# Patient Record
Sex: Female | Born: 1980
Health system: Southern US, Community
[De-identification: ages and names within clinical notes are randomized; demographics above are authoritative.]

## PROBLEM LIST (undated history)

## (undated) DIAGNOSIS — R519 Headache, unspecified: Secondary | ICD-10-CM

## (undated) DIAGNOSIS — S22000A Wedge compression fracture of unspecified thoracic vertebra, initial encounter for closed fracture: Secondary | ICD-10-CM

## (undated) DIAGNOSIS — F419 Anxiety disorder, unspecified: Secondary | ICD-10-CM

## (undated) DIAGNOSIS — R430 Anosmia: Secondary | ICD-10-CM

## (undated) DIAGNOSIS — S069X9A Unspecified intracranial injury with loss of consciousness of unspecified duration, initial encounter: Secondary | ICD-10-CM

## (undated) DIAGNOSIS — M1990 Unspecified osteoarthritis, unspecified site: Secondary | ICD-10-CM

## (undated) DIAGNOSIS — R51 Headache: Secondary | ICD-10-CM

## (undated) DIAGNOSIS — F32A Depression, unspecified: Secondary | ICD-10-CM

## (undated) HISTORY — DX: Unspecified osteoarthritis, unspecified site: M19.90

## (undated) HISTORY — DX: Wedge compression fracture of unspecified thoracic vertebra, initial encounter for closed fracture: S22.000A

## (undated) HISTORY — PX: CRANIOTOMY: SHX93

## (undated) HISTORY — DX: Anosmia: R43.0

## (undated) HISTORY — PX: FRACTURE SURGERY: SHX138

## (undated) HISTORY — PX: CRANIOPLASTY: SUR330

## (undated) HISTORY — DX: Unspecified intracranial injury with loss of consciousness of unspecified duration, initial encounter: S06.9X9A

## (undated) HISTORY — DX: Anxiety disorder, unspecified: F41.9

## (undated) HISTORY — PX: INDUCED ABORTION: SHX677

## (undated) HISTORY — DX: Headache: R51

## (undated) HISTORY — PX: ORTHOPEDIC SURGERY: SHX850

## (undated) HISTORY — DX: Headache, unspecified: R51.9

---

## 2011-11-06 ENCOUNTER — Emergency Department (HOSPITAL_COMMUNITY)
Admission: EM | Admit: 2011-11-06 | Discharge: 2011-11-06 | Disposition: A | Discharge status: Home / Self Care | Payer: Self-pay | Attending: Emergency Medicine | Admitting: Emergency Medicine

## 2011-11-06 ENCOUNTER — Encounter (HOSPITAL_COMMUNITY): Payer: Self-pay

## 2011-11-06 ENCOUNTER — Emergency Department (HOSPITAL_COMMUNITY): Payer: Self-pay

## 2011-11-06 DIAGNOSIS — R259 Unspecified abnormal involuntary movements: Secondary | ICD-10-CM | POA: Insufficient documentation

## 2011-11-06 DIAGNOSIS — F1020 Alcohol dependence, uncomplicated: Secondary | ICD-10-CM | POA: Insufficient documentation

## 2011-11-06 DIAGNOSIS — R569 Unspecified convulsions: Secondary | ICD-10-CM | POA: Insufficient documentation

## 2011-11-06 DIAGNOSIS — F10939 Alcohol use, unspecified with withdrawal, unspecified: Secondary | ICD-10-CM | POA: Insufficient documentation

## 2011-11-06 DIAGNOSIS — F10239 Alcohol dependence with withdrawal, unspecified: Secondary | ICD-10-CM | POA: Insufficient documentation

## 2011-11-06 MED ORDER — LORAZEPAM 1 MG PO TABS
2.0000 mg | ORAL_TABLET | Freq: Once | ORAL | Status: AC
  Administered 2011-11-06: 2 mg via ORAL
  Filled 2011-11-06: qty 2

## 2011-11-06 MED ORDER — LORAZEPAM 2 MG PO TABS: 2.0000 mg | ORAL_TABLET | Freq: Once | ORAL | Status: AC

## 2011-11-06 NOTE — ED Provider Notes (Addendum)
History     CSN: [id number]  Arrival date & time 11/06/11  1904   First MD Initiated Contact with Patient 11/06/11 1920      Chief Complaint  Patient presents with  . Seizures    HPI Pt states that she had a slight HA but not severe enough to cause alarm throughout the day. Pt also states that she was shakey and has not eaten a lot today prior to seizure. Pt states that the last thing she remembers was watching TV in the living room and then she remembers waking up on the couch post seizure. Pt denies incontinence or biting her tongue, pt does not believe she hit her head, pt denies any pain at this time. Pt is alert and oriented and able to follow commands and move extremities. Seizure pads placed on bed, pt aware and fiance aware to press call button if anything changes or pt is concerned. Pt admits to an abortion 2-2 1/2 weeks ago after [redacted] weeks pregnant. Pt states that she has not had any problems from the abortion such as bleeding.   Pt last drank 1-2 days ago and according to husband, she drinks  5-10 beers/day.  Has felt "jittery" toady prior to seizure. History reviewed. No pertinent past medical history.  Past Surgical History  Procedure Date  . Orthopedic surgery   . Induced abortion     10/26/11    History reviewed. No pertinent family history.  History  Substance Use Topics  . Smoking status: Current Everyday Smoker -- 0.5 packs/day for 10 years    Types: Cigarettes  . Smokeless tobacco: Not on file  . Alcohol Use: 1.2 oz/week    2 Cans of beer per week    OB History    Grav Para Term Preterm Abortions TAB SAB Ect Mult Living   2 0              Review of Systems Remaining unremarkable except as noted in history of present illness Allergies  Review of patient's allergies indicates no known allergies.  Home Medications   Current Outpatient Rx  Name Route Sig Dispense Refill  . GOODY HEADACHE PO Oral Take 1 packet by mouth daily as needed. For headache      . BUPRENORPHINE HCL-NALOXONE HCL 8-2 MG SL SUBL Sublingual Place 1 tablet under the tongue daily as needed. For urge    . ADULT MULTIVITAMIN W/MINERALS CH Oral Take 1 tablet by mouth daily.    Marland Kitchen LORAZEPAM 2 MG PO TABS Oral Take 1 tablet (2 mg total) by mouth once. 30 tablet 0    BP 112/63  Pulse 80  Temp(Src) 98.3 F (36.8 C) (Oral)  Resp 16  SpO2 97%  LMP 09/22/2011  Physical Exam  Nursing note and vitals reviewed. Constitutional: She is oriented to person, place, and time. She appears well-developed and well-nourished. No distress.  HENT:  Head: Normocephalic and atraumatic.  Eyes: Pupils are equal, round, and reactive to light.  Neck: Normal range of motion.  Cardiovascular: Normal rate and intact distal pulses.   Pulmonary/Chest: No respiratory distress.  Abdominal: Normal appearance. She exhibits no distension. There is no tenderness. There is no rebound.  Musculoskeletal: Normal range of motion.  Neurological: She is alert and oriented to person, place, and time. No cranial nerve deficit.  Skin: Skin is warm and dry. No rash noted.  Psychiatric: She has a normal mood and affect. Her behavior is normal.    ED Course  Procedures (  including critical care time)  Labs Reviewed - No data to display No results found.   1. Seizure   2. Alcohol withdrawal       MDM          Nelia Shi, MD 11/08/11 6213  Nelia Shi, MD 11/08/11 [phone]

## 2011-11-06 NOTE — Discharge Instructions (Signed)
Alcohol Withdrawal Anytime drug use is interfering with normal living activities it has become abuse. This includes problems with family and friends. Psychological dependence has developed when your mind tells you that the drug is needed. This is usually followed by physical dependence when a continuing increase of drugs are required to get the same feeling or "high." This is known as addiction or chemical dependency. A person's risk is much higher if there is a history of chemical dependency in the family. Mild Withdrawal Following Stopping Alcohol, When Addiction or Chemical Dependency Has Developed When a person has developed tolerance to alcohol, any sudden stopping of alcohol can cause uncomfortable physical symptoms. Most of the time these are mild and consist of tremors in the hands and increases in heart rate, breathing, and temperature. Sometimes these symptoms are associated with anxiety, panic attacks, and bad dreams. There may also be stomach upset. Normal sleep patterns are often interrupted with periods of inability to sleep (insomnia). This may last for 6 months. Because of this discomfort, many people choose to continue drinking to get rid of this discomfort and to try to feel normal. Severe Withdrawal with Decreased or No Alcohol Intake, When Addiction or Chemical Dependency Has Developed About five percent of alcoholics will develop signs of severe withdrawal when they stop using alcohol. One sign of this is development of generalized seizures (convulsions). Other signs of this are severe agitation and confusion. This may be associated with believing in things which are not real or seeing things which are not really there (delusions and hallucinations). Vitamin deficiencies are usually present if alcohol intake has been long-term. Treatment for this most often requires hospitalization and close observation. Addiction can only be helped by stopping use of all chemicals. This is hard but may  save your life. With continual alcohol use, possible outcomes are usually loss of self respect and esteem, violence, and death. Addiction cannot be cured but it can be stopped. This often requires outside help and the care of professionals. Treatment centers are listed in the yellow pages under Cocaine, Narcotics, and Alcoholics Anonymous. Most hospitals and clinics can refer you to a specialized care center. It is not necessary for you to go through the uncomfortable symptoms of withdrawal. Your caregiver can provide you with medicines that will help you through this difficult period. Try to avoid situations, friends, or drugs that made it possible for you to keep using alcohol in the past. Learn how to say no. It takes a long period of time to overcome addictions to all drugs, including alcohol. There may be many times when you feel as though you want a drink. After getting rid of the physical addiction and withdrawal, you will have a lessening of the craving which tells you that you need alcohol to feel normal. Call your caregiver if more support is needed. Learn who to talk to in your family and among your friends so that during these periods you can receive outside help. Alcoholics Anonymous (AA) has helped many people over the years. To get further help, contact AA or call your caregiver, counselor, or clergyperson. Al-Anon and Alateen are support groups for friends and family members of an alcoholic. The people who love and care for an alcoholic often need help, too. For information about these organizations, check your phone directory or call a local alcoholism treatment center.  SEEK IMMEDIATE MEDICAL CARE IF:   You have a seizure.   You have a fever.   You experience uncontrolled vomiting  or you vomit up blood. This may be bright red or look like black coffee grounds.   You have blood in the stool. This may be bright red or appear as a black, tarry, bad-smelling stool.   You become  lightheaded or faint. Do not drive if you feel this way. Have someone else drive you or call 161 for help.   You become more agitated or confused.   You develop uncontrolled anxiety.   You begin to see things that are not really there (hallucinate).  Your caregiver has determined that you completely understand your medical condition, and that your mental state is back to normal. You understand that you have been treated for alcohol withdrawal, have agreed not to drink any alcohol for a minimum of 1 day, will not operate a car or other machinery for 24 hours, and have had an opportunity to ask any questions about your condition. Document Released: 07/07/2005 Document Revised: 06/09/2011 Document Reviewed: 05/15/2008 [facility] Patient Information 2012 [place], [place].Seizure, Adult A seizure happens when there is uncontrolled activity in the brain. Most seizures cause either the whole body to shake or just one part of the body to shake. The shaking is called a convulsion. Other seizures cause the body or a part of the body to tense up.  HOME CARE   Keep all follow-up visits.   Take medicines as told by your doctor.   Do not swim until your doctor says it is okay.   Do not drive until your doctor says it is okay.   Make sure your friends, loved ones, and coworkers know to do the following things if you have a seizure:   Lower you gently to the ground.   Keep the area around you free of things that might fall or things that you might grab.   Avoid putting anything in or near your mouth.   Call your local emergency services (911 in [place]  GET HELP RIGHT AWAY IF:   You have confusion or feel dazed.   You feel sleepy.  MAKE SURE YOU:   Understand these instructions.   Will watch your condition.   Will get help right away if you are not doing well or get worse.  Document Released: 03/15/2008 Document Revised: 06/09/2011 Document Reviewed: 03/09/2010 [facility] Patient Information  2012 [place], [place].

## 2011-11-06 NOTE — ED Notes (Signed)
Ems states called on scene for seizure activity. Family described grand mal seizure activity. Pt was alert to person and place on scene with no seizure activity.

## 2011-11-06 NOTE — ED Notes (Addendum)
Pt states that she had a slight HA but not severe enough to cause alarm throughout the day. Pt also states that she was shakey and has not eaten a lot today prior to seizure.  Pt states that the last thing she remembers was watching TV in the living room and then she remembers waking up on the couch post seizure. Pt denies incontinence, pt does not believe she hit her head, pt denies any pain at this time. Pt is alert and oriented and able to follow commands and move extremities. Seizure pads placed on bed, pt aware and fiance aware to press call button if anything changes or pt is concerned. Pt admits to an abortion 2-2 1/2 weeks ago after [redacted] weeks pregnant. Pt states that she has not had any problems from the abortion such as bleeding.

## 2011-11-06 NOTE — ED Notes (Signed)
Fiance' is in [place]  Nelia Shi, MD 11/06/11 2010

## 2013-07-28 DIAGNOSIS — S069XAA Unspecified intracranial injury with loss of consciousness status unknown, initial encounter: Secondary | ICD-10-CM

## 2013-07-28 DIAGNOSIS — S069X9A Unspecified intracranial injury with loss of consciousness of unspecified duration, initial encounter: Secondary | ICD-10-CM

## 2013-07-28 HISTORY — DX: Unspecified intracranial injury with loss of consciousness of unspecified duration, initial encounter: S06.9X9A

## 2013-07-28 HISTORY — DX: Unspecified intracranial injury with loss of consciousness status unknown, initial encounter: S06.9XAA

## 2013-09-18 ENCOUNTER — Ambulatory Visit: Payer: Medicaid Other | Admitting: Occupational Therapy

## 2013-09-18 ENCOUNTER — Ambulatory Visit: Payer: Medicaid Other | Attending: *Deleted | Admitting: Physical Therapy

## 2013-09-18 ENCOUNTER — Ambulatory Visit: Payer: Medicaid Other | Admitting: Speech Pathology

## 2013-09-18 DIAGNOSIS — M6281 Muscle weakness (generalized): Secondary | ICD-10-CM | POA: Diagnosis not present

## 2013-09-18 DIAGNOSIS — R5381 Other malaise: Secondary | ICD-10-CM | POA: Diagnosis not present

## 2013-09-18 DIAGNOSIS — R4189 Other symptoms and signs involving cognitive functions and awareness: Secondary | ICD-10-CM | POA: Diagnosis not present

## 2013-09-18 DIAGNOSIS — IMO0001 Reserved for inherently not codable concepts without codable children: Secondary | ICD-10-CM | POA: Diagnosis present

## 2014-02-08 ENCOUNTER — Ambulatory Visit: Payer: Medicaid Other | Attending: *Deleted

## 2014-02-08 DIAGNOSIS — R41841 Cognitive communication deficit: Secondary | ICD-10-CM | POA: Insufficient documentation

## 2014-02-08 DIAGNOSIS — IMO0001 Reserved for inherently not codable concepts without codable children: Secondary | ICD-10-CM | POA: Diagnosis present

## 2014-02-08 DIAGNOSIS — R4701 Aphasia: Secondary | ICD-10-CM | POA: Insufficient documentation

## 2014-04-26 ENCOUNTER — Ambulatory Visit (INDEPENDENT_AMBULATORY_CARE_PROVIDER_SITE_OTHER): Payer: Medicaid Other | Admitting: Family Medicine

## 2014-04-26 ENCOUNTER — Encounter: Payer: Self-pay | Admitting: Family Medicine

## 2014-04-26 VITALS — BP 107/62 | HR 86 | Temp 99.1°F | Resp 16 | Wt 138.0 lb

## 2014-04-26 DIAGNOSIS — Z5189 Encounter for other specified aftercare: Secondary | ICD-10-CM

## 2014-04-26 DIAGNOSIS — F411 Generalized anxiety disorder: Secondary | ICD-10-CM

## 2014-04-26 DIAGNOSIS — S069X0D Unspecified intracranial injury without loss of consciousness, subsequent encounter: Secondary | ICD-10-CM

## 2014-04-26 DIAGNOSIS — R41840 Attention and concentration deficit: Secondary | ICD-10-CM

## 2014-04-26 MED ORDER — METHYLPHENIDATE HCL 5 MG PO TABS: 5.0000 mg | ORAL_TABLET | Freq: Three times a day (TID) | ORAL | Status: DC

## 2014-04-26 MED ORDER — DULOXETINE HCL 30 MG PO CPEP: 30.0000 mg | ORAL_CAPSULE | Freq: Every day | ORAL | Status: DC

## 2014-04-26 NOTE — Progress Notes (Signed)
**Note Wanda-Identified via Obfuscation**    Subjective:    Patient ID: Wanda Warner, female    DOB: [birth year], 33 y.o.   MRN: [id number]  HPI  Wanda Warner is here to establish care.   Has chronic headaches since her accident last year. Photosentivity since accident. No prior history of headaches prior to the accident. They a occur on a daily basis. The pain is either frontal or parietal in distribution. Throbbing in nature.    She had a history of anxiety prior to her anxiety but it has worsened since her accident. She was managed with clonopin prior to her accident for accident. She has been on ritalin since her accident. This has help improve her symptoms. She was treated with buspirone, trazodone for sleep and celexa after her accident. These three medications don't seem to be helping currently. Patient has ongoing anxiety and intolerance to going to public places. She has fixates on things and cannot get them out of her head. She can't go to grocery store.   Current Outpatient Prescriptions on File Prior to Visit  Medication Sig Dispense Refill  . Aspirin-Acetaminophen-Caffeine (GOODY HEADACHE PO) Take 1 packet by mouth daily as needed. For headache      . buprenorphine-naloxone (SUBOXONE) 8-2 MG SUBL Place 1 tablet under the tongue daily as needed. For urge      . Multiple Vitamin (MULITIVITAMIN WITH MINERALS) TABS Take 1 tablet by mouth daily.       No current facility-administered medications on file prior to visit.    SHx: A pack can last a month.   Health Maintenance: pap smear needed  Review of Systems See HPI     Objective:   Physical Exam BP 107/62  Pulse 86  Temp(Src) 99.1 F (37.3 C) (Oral)  Resp 16  Wt 138 lb (62.596 kg)  SpO2 100% Gen: NAD, alert, cooperative with exam, well-appearing,speech is slowed  CV: RRR, good S1/S2, no murmur, no edema, capillary refill brisk  Resp: CTABL, no wheezes, non-labored Abd: SNTND, BS present, no guarding or organomegaly Neuro: CN 2-12 intact  Ext: no edema, normal  sensation b/l in UE and LE       Assessment & Plan:

## 2014-04-26 NOTE — Patient Instructions (Signed)
Thank you for coming in,   I recommend that you meet with our psychologist, Dr. Spero GeraldsMichelle Kane, for help dealing with your anxiety.  You can schedule an appointment with her by calling her directly at [phone].  I have prescribed cymbalta. It may take a few days before you start feeling these effects. Please continue to take it.   Please follow up with me in two weeks to see how things are going.    Please feel free to call with any questions or concerns at any time, at [phone]. --Dr. Jordan LikesSchmitz

## 2014-05-02 DIAGNOSIS — S069X9A Unspecified intracranial injury with loss of consciousness of unspecified duration, initial encounter: Secondary | ICD-10-CM | POA: Insufficient documentation

## 2014-05-02 DIAGNOSIS — S069XAA Unspecified intracranial injury with loss of consciousness status unknown, initial encounter: Secondary | ICD-10-CM | POA: Insufficient documentation

## 2014-05-02 DIAGNOSIS — F411 Generalized anxiety disorder: Secondary | ICD-10-CM | POA: Insufficient documentation
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
## 2014-05-08 ENCOUNTER — Telehealth: Payer: Self-pay | Admitting: Family Medicine

## 2014-05-08 NOTE — Telephone Encounter (Signed)
[name]. During appointment to establish care, boyfriend was requesting benzo for acute anxiety or narcotic for constant headache, agreed on starting cymbalta. Unsure if she has a true side effect or if drug seeking. Patient is still new to this provider so I am still cautions.   Myra RudeJeremy E Schmitz, MD PGY-2, [facility] 05/08/2014, 8:44 PM

## 2014-05-08 NOTE — Telephone Encounter (Signed)
Spoke with husband,he states patient complains of  feelinf hung over when she wakes up since on cymbalta.Denies suicidal thoughts.husband was hoping we could give him advise thru phone call instead of having him bring her in.Patients does have follow up appointment as you requested.please advise.thank you .Torben Soloway, Virgel BouquetGiovanna S

## 2014-05-08 NOTE — Telephone Encounter (Signed)
Boyfriend called and would like the nurse or doctor to call and discuss the medication Cymbalta. She is having a lot of side effects and they would like to know if she should stop or have the dosage changed. Please call jw

## 2014-05-09 ENCOUNTER — Telehealth: Payer: Self-pay | Admitting: Psychology

## 2014-05-09 NOTE — Telephone Encounter (Signed)
Relayed message,patient's husband states she could wait til Monday.he's out of town for business.I advised him to call front office and schedule an appointment.he voiced understanding.Bland Rudzinski, Virgel BouquetGiovanna S
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
## 2014-05-10 ENCOUNTER — Telehealth: Payer: Self-pay | Admitting: Psychology

## 2014-05-10 NOTE — Telephone Encounter (Signed)
I received a message back from Eula FlaxMichael Zelson, PH.D.  He is a Web designerneuropsychologist in the [facility].  This was his response to my question about resources:  "I agree that the best course of action would be for her to re-connect with the [facility]. I do not know of a local psychiatrist who has specific experience with TBI. Rather than a psychiatrist, I would recommend considering Ivory BroadZach Swartz, MD, [phone]) who is a rehab physician who specializes in TBI. I have worked with him on mental health issues on many TBI cases."  I called Mr. Dewaine CongerBarker and provided him this information.

## 2014-05-13 ENCOUNTER — Ambulatory Visit (INDEPENDENT_AMBULATORY_CARE_PROVIDER_SITE_OTHER): Payer: Medicaid Other | Admitting: Family Medicine

## 2014-05-13 ENCOUNTER — Encounter: Payer: Self-pay | Admitting: Family Medicine

## 2014-05-13 VITALS — BP 117/74 | HR 86 | Temp 98.5°F | Resp 14 | Ht 63.0 in | Wt 138.0 lb

## 2014-05-13 DIAGNOSIS — Z5189 Encounter for other specified aftercare: Secondary | ICD-10-CM

## 2014-05-13 DIAGNOSIS — F411 Generalized anxiety disorder: Secondary | ICD-10-CM

## 2014-05-13 DIAGNOSIS — S069X0D Unspecified intracranial injury without loss of consciousness, subsequent encounter: Secondary | ICD-10-CM

## 2014-05-13 DIAGNOSIS — R41840 Attention and concentration deficit: Secondary | ICD-10-CM

## 2014-05-13 MED ORDER — METHYLPHENIDATE HCL 5 MG PO TABS: 5.0000 mg | ORAL_TABLET | Freq: Three times a day (TID) | ORAL | Status: DC

## 2014-05-13 MED ORDER — METHYLPHENIDATE HCL 5 MG PO TABS
5.0000 mg | ORAL_TABLET | Freq: Three times a day (TID) | ORAL | Status: DC
Start: 2014-05-13 — End: 2014-05-13

## 2014-05-13 MED ORDER — VENLAFAXINE HCL ER 37.5 MG PO CP24: 37.5000 mg | ORAL_CAPSULE | Freq: Every day | ORAL | Status: DC

## 2014-05-13 NOTE — Assessment & Plan Note (Addendum)
Discussion with patient about anxiety today. Educated patient and fianc on the addictive properties of Xanax and Klonopin, and believe they would receive a better benefit out of something long acting instead. Believe she would benefit from long acting SSRI/SNRI. Discussed with Dr. Raymondo BandKoval. Started patient on low-dose Effexor today. We will need to see patient back in 2-3 weeks to see how she is doing, and consider increasing dose at that time if necessary only. In addition patient is unable to followup at [facility], she was given a referral to Dr. Johny ChessZack Swartz who specializes in TBI. In addition referral was placed for pain management and TBI. - Followup with PCP in 2-3 weeks.

## 2014-05-13 NOTE — Patient Instructions (Signed)
Generalized Anxiety Disorder Generalized anxiety disorder (GAD) is a mental disorder. It interferes with life functions, including relationships, work, and school. GAD is different from normal anxiety, which everyone experiences at some point in their lives in response to specific life events and activities. Normal anxiety actually helps us prepare for and get through these life events and activities. Normal anxiety goes away after the event or activity is over.  GAD causes anxiety that is not necessarily related to specific events or activities. It also causes excess anxiety in proportion to specific events or activities. The anxiety associated with GAD is also difficult to control. GAD can vary from mild to severe. People with severe GAD can have intense waves of anxiety with physical symptoms (panic attacks).  SYMPTOMS The anxiety and worry associated with GAD are difficult to control. This anxiety and worry are related to many life events and activities and also occur more days than not for 6 months or longer. People with GAD also have three or more of the following symptoms (one or more in children):  Restlessness.   Fatigue.  Difficulty concentrating.   Irritability.  Muscle tension.  Difficulty sleeping or unsatisfying sleep. DIAGNOSIS GAD is diagnosed through an assessment by your health care provider. Your health care provider will ask you questions aboutyour mood,physical symptoms, and events in your life. Your health care provider may ask you about your medical history and use of alcohol or drugs, including prescription medicines. Your health care provider may also do a physical exam and blood tests. Certain medical conditions and the use of certain substances can cause symptoms similar to those associated with GAD. Your health care provider may refer you to a mental health specialist for further evaluation. TREATMENT The following therapies are usually used to treat GAD:    Medication. Antidepressant medication usually is prescribed for long-term daily control. Antianxiety medicines may be added in severe cases, especially when panic attacks occur.   Talk therapy (psychotherapy). Certain types of talk therapy can be helpful in treating GAD by providing support, education, and guidance. A form of talk therapy called cognitive behavioral therapy can teach you healthy ways to think about and react to daily life events and activities.  Stress managementtechniques. These include yoga, meditation, and exercise and can be very helpful when they are practiced regularly. A mental health specialist can help determine which treatment is best for you. Some people see improvement with one therapy. However, other people require a combination of therapies. Document Released: 01/22/2013 Document Revised: 02/11/2014 Document Reviewed: 01/22/2013 [facility] Patient Information 2015 [place]. This information is not intended to replace advice given to you by your health care provider. Make sure you discuss any questions you have with your health care provider.  Please make an appt with Dr. Lamar BlinksZach Zwartz @ [phone]

## 2014-05-13 NOTE — Progress Notes (Signed)
**Note Wanda-Identified via Obfuscation**    Subjective:    Patient ID: Wanda Warner, female    DOB: 1980-12-16, 33 y.o.   MRN: [id number]  HPI Wanda BlanchRita Dowdle is a 33 y.o. female with TBI returned to [facility] for reaction to medication.   Medication reaction/anxiety: Patient has a history of chronic pain, headaches with photosensitivity since traumatic brain injury she suffered last October. She had been seen at [facility] for traumatic brain injury, however she is unable to continue followup secondary to transportation and finances. She seen in the clinic a few months ago, and was started on Cymbalta. Patient states Cymbalta made her feel like a "zombie ". In addition she was given Adderall 5 mg. She discontinued the Cymbalta immediately. She states she still having problems with her anxiety more after the accident, than prior to the accident. She feels the Ritalin is improving her symptoms. She has been tried on buspirone, trazodone and Celexa after her accident without improvement in symptoms. Neysa BonitoFianc states that he has trouble getting her to leave the house and going to public places. Patient and fianc asking for Klonopin, because that had worked for her in the past as well as Xanax.  Review of Systems Per hpi     Objective:   Physical Exam BP 117/74  Pulse 86  Temp(Src) 98.5 F (36.9 C)  Resp 14  Ht 5\' 3"  (1.6 m)  Wt 138 lb (62.596 kg)  BMI 24.45 kg/m2  LMP 05/10/2014 Gen: Pleasant Caucasian thin female. No acute distress, nontoxic in appearance does not appear to be anxious. Wearing sunglasses. HEENT: Healed scar over right temple and right parietal region. Bernie. MMM.  CV: RRR  Chest: CTAB, no wheeze or crackles Abd: Soft. Thin. NTND. BS present. No Masses palpated.  Neuro:  Alert. Oriented. Cranial nerves II through XII grossly intact. Psych: Does not appear to be anxious today. Pleasant. Normal dress and demeanor. Normal speech     Assessment & Plan:

## 2014-05-20 NOTE — Telephone Encounter (Signed)
Erronenous encounter

## 2014-05-21 ENCOUNTER — Telehealth: Payer: Self-pay | Admitting: Family Medicine

## 2014-05-21 NOTE — Telephone Encounter (Signed)
Patient seen by Dr. Claiborne BillingsKuneff on 8/3 and was told that a referral to Dr. Johny ChessZack Swartz would be placed. Referral was not placed in [facility]. Pt's fiance would like this to be done. Please advise.

## 2014-05-21 NOTE — Telephone Encounter (Signed)
Please advise.Thank you.Sandi Towe S  

## 2014-05-22 ENCOUNTER — Other Ambulatory Visit: Payer: Self-pay | Admitting: Family Medicine

## 2014-05-22 DIAGNOSIS — Z87820 Personal history of traumatic brain injury: Secondary | ICD-10-CM

## 2014-06-04 ENCOUNTER — Telehealth: Payer: Self-pay | Admitting: Family Medicine

## 2014-06-04 NOTE — Telephone Encounter (Signed)
Please advise. Wanda Warner S  

## 2014-06-04 NOTE — Telephone Encounter (Signed)
Per financee: her venlasaxin is not working. Please advise

## 2014-06-12 ENCOUNTER — Other Ambulatory Visit: Payer: Self-pay | Admitting: *Deleted

## 2014-06-12 DIAGNOSIS — F411 Generalized anxiety disorder: Secondary | ICD-10-CM

## 2014-06-13 MED ORDER — VENLAFAXINE HCL ER 37.5 MG PO CP24: 37.5000 mg | ORAL_CAPSULE | Freq: Every day | ORAL | Status: DC

## 2014-07-15 ENCOUNTER — Other Ambulatory Visit: Payer: Self-pay | Admitting: Family Medicine

## 2014-07-15 NOTE — Telephone Encounter (Signed)
Pt also needs refill for Ritalin. Pls advise. Pt has made appt w/ Jordan LikesSchmitz for next Tues 10/13 @ 2:15 pm

## 2014-07-16 NOTE — Telephone Encounter (Signed)
Needs refill on Effexor

## 2014-07-17 ENCOUNTER — Encounter: Payer: Self-pay | Admitting: Physical Medicine & Rehabilitation

## 2014-07-23 ENCOUNTER — Ambulatory Visit (INDEPENDENT_AMBULATORY_CARE_PROVIDER_SITE_OTHER): Payer: Medicaid Other | Admitting: Family Medicine

## 2014-07-23 VITALS — BP 109/56 | HR 85 | Temp 98.6°F | Resp 18 | Wt 151.0 lb

## 2014-07-23 DIAGNOSIS — R41840 Attention and concentration deficit: Secondary | ICD-10-CM

## 2014-07-23 DIAGNOSIS — S069X0D Unspecified intracranial injury without loss of consciousness, subsequent encounter: Secondary | ICD-10-CM

## 2014-07-23 DIAGNOSIS — F411 Generalized anxiety disorder: Secondary | ICD-10-CM

## 2014-07-23 DIAGNOSIS — R51 Headache: Secondary | ICD-10-CM

## 2014-07-23 DIAGNOSIS — R519 Headache, unspecified: Secondary | ICD-10-CM

## 2014-07-23 DIAGNOSIS — R5383 Other fatigue: Secondary | ICD-10-CM

## 2014-07-23 MED ORDER — METHYLPHENIDATE HCL 5 MG PO TABS: 5.0000 mg | ORAL_TABLET | Freq: Three times a day (TID) | ORAL | Status: DC

## 2014-07-23 MED ORDER — TRAMADOL HCL 50 MG PO TABS: 50.0000 mg | ORAL_TABLET | Freq: Three times a day (TID) | ORAL | Status: DC | PRN

## 2014-07-23 NOTE — Patient Instructions (Signed)
Thank you for coming in,   I will put in future orders for the lab draws. Please call our clinic in order to schedule a date and time.   I will give enough ritalin and tramadol to last until your appointment with Dr. Hermelinda MedicusSchwartz.    Please feel free to call with any questions or concerns at any time, at [phone]. --Dr. Jordan LikesSchmitz

## 2014-07-24 ENCOUNTER — Telehealth: Payer: Self-pay | Admitting: *Deleted

## 2014-07-24 DIAGNOSIS — G44309 Post-traumatic headache, unspecified, not intractable: Secondary | ICD-10-CM | POA: Insufficient documentation

## 2014-07-24 DIAGNOSIS — R5383 Other fatigue: Secondary | ICD-10-CM | POA: Insufficient documentation

## 2014-07-24 NOTE — Progress Notes (Signed)
**Note Wanda-Identified via Obfuscation**    Subjective:    Patient ID: Wanda Warner, female    DOB: 1981-07-05, 33 y.o.   MRN: [id number]  HPI Wanda Warner is here for f/u of her headache and anxiety.   Headache: this has been ongoing since her TBI in October of 2014. It occurs in a frontal distribution. It happens most days and is throbbing and pulsatile in nature.   Reports having mood changes and anxiety. She has tried buspar, celexa, trazadone, cymbalta and recently effexor. She either didn't tolerate these medications or they didn't work. She reports that xanax and klonopin has worked for her previously.   Fatigue: has worsening fatigue. Has been gaining weight. She doesn't feel like leaving the house. She takes several naps and get tired more easily.  She takes a MVI. Denies polyuria. Reports that taking ritalin is the only thing that provides her with energy.    Current Outpatient Prescriptions on File Prior to Visit  Medication Sig Dispense Refill  . Multiple Vitamin (MULITIVITAMIN WITH MINERALS) TABS Take 1 tablet by mouth daily.      Marland Kitchen. venlafaxine XR (EFFEXOR-XR) 37.5 MG 24 hr capsule take 1 capsule by mouth once daily WITH BREAKFAST  30 capsule  0   No current facility-administered medications on file prior to visit.    Review of Systems See HPI     Objective:   Physical Exam BP 109/56  Pulse 85  Temp(Src) 98.6 F (37 C) (Oral)  Resp 18  Wt 151 lb (68.493 kg)  SpO2 100% Gen: NAD, alert, cooperative with exam, wearing sunglasses due to the light in the room. HEENT: NCAT, PERRL, clear conjunctiva, oropharynx clear, supple neck CV: RRR, good S1/S2, no murmur, no edema, capillary refill brisk  Resp: CTABL, no wheezes, non-labored Neuro: CN 2-12 intact, +3 reflexes in Knee and triceps  Psych: started crying during HPI  MSK: no spinal or paraspinal tenderness to palpation.        Assessment & Plan:

## 2014-07-24 NOTE — Assessment & Plan Note (Signed)
Not responding to several treatment attempts.  - will wean off effexor and re-evaluate after f/u with Dr. Hermelinda MedicusSchwartz

## 2014-07-24 NOTE — Assessment & Plan Note (Signed)
Unsure if related to her other symptoms or some other cause. She reports that ritalin is the only thing that will help her. Patient and her boyfriend are requesting to increase the dose.  - ritalin 5 mg TID #90 - consider switching to ER release.  - will order labs

## 2014-07-24 NOTE — Telephone Encounter (Signed)
Vincenza HewsShane called in reference to patients appt on 08/20/14.  Due to Medicaid issues needs to move up appt before 08/09/14 or needs someone to call him about prices.

## 2014-07-24 NOTE — Assessment & Plan Note (Signed)
Still an ongoing issue.  - pending referral to Dr. Valeta HarmsZack Schwartz

## 2014-07-24 NOTE — Assessment & Plan Note (Signed)
Appears to be associated with her TBI. At the time of her release from her neurosurgeon, she was symptom free and controlled with ibuprofen. Denies any vision changes. CN intact. hyper reflexive on exam.  - tramadol 50 mg Q8 PRN #90.

## 2014-08-02 ENCOUNTER — Other Ambulatory Visit: Payer: Medicaid Other

## 2014-08-02 DIAGNOSIS — R5383 Other fatigue: Secondary | ICD-10-CM

## 2014-08-02 LAB — COMPREHENSIVE METABOLIC PANEL
ALT: 9 U/L (ref 0–35)
AST: 15 U/L (ref 0–37)
Albumin: 4.4 g/dL (ref 3.5–5.2)
Alkaline Phosphatase: 57 U/L (ref 39–117)
BUN: 8 mg/dL (ref 6–23)
CALCIUM: 9.5 mg/dL (ref 8.4–10.5)
CHLORIDE: 105 meq/L (ref 96–112)
CO2: 27 mEq/L (ref 19–32)
CREATININE: 0.65 mg/dL (ref 0.50–1.10)
Glucose, Bld: 95 mg/dL (ref 70–99)
Potassium: 3.8 mEq/L (ref 3.5–5.3)
Sodium: 141 mEq/L (ref 135–145)
Total Bilirubin: 0.4 mg/dL (ref 0.2–1.2)
Total Protein: 6.6 g/dL (ref 6.0–8.3)

## 2014-08-02 LAB — CBC
HEMATOCRIT: 38.4 % (ref 36.0–46.0)
Hemoglobin: 13.3 g/dL (ref 12.0–15.0)
MCH: 30.9 pg (ref 26.0–34.0)
MCHC: 34.6 g/dL (ref 30.0–36.0)
MCV: 89.1 fL (ref 78.0–100.0)
Platelets: 274 10*3/uL (ref 150–400)
RBC: 4.31 MIL/uL (ref 3.87–5.11)
RDW: 13.9 % (ref 11.5–15.5)
WBC: 7.5 10*3/uL (ref 4.0–10.5)

## 2014-08-02 NOTE — Progress Notes (Signed)
CMP,CBC AND TSH DONE TODAY Arvil Utz

## 2014-08-03 LAB — TSH: TSH: 1.51 u[IU]/mL (ref 0.350–4.500)

## 2014-08-05 ENCOUNTER — Telehealth: Payer: Self-pay | Admitting: *Deleted

## 2014-08-05 NOTE — Telephone Encounter (Signed)
Informed of below 

## 2014-08-05 NOTE — Telephone Encounter (Signed)
Message copied by Farrell OursEVANS, Karle Desrosier K on Mon Aug 05, 2014 12:19 PM ------      Message from: Myra RudeSCHMITZ, JEREMY E      Created: Sat Aug 03, 2014  5:41 PM       Please call patient and inform all labs are normal. Thank you. ------

## 2014-08-05 NOTE — Telephone Encounter (Signed)
Message copied by Farrell OursEVANS, Leniya Breit K on Mon Aug 05, 2014 12:20 PM ------      Message from: Myra RudeSCHMITZ, JEREMY E      Created: Sat Aug 03, 2014  5:40 PM       Please call patient and inform her metabolic panel was normal. Thank you. ------

## 2014-08-12 ENCOUNTER — Encounter: Payer: Self-pay | Admitting: Family Medicine

## 2014-08-20 ENCOUNTER — Encounter: Payer: Self-pay | Admitting: Physical Medicine & Rehabilitation

## 2014-08-20 ENCOUNTER — Other Ambulatory Visit: Payer: Self-pay | Admitting: Physical Medicine & Rehabilitation

## 2014-08-20 ENCOUNTER — Encounter: Payer: Medicaid Other | Attending: Physical Medicine & Rehabilitation | Admitting: Physical Medicine & Rehabilitation

## 2014-08-20 VITALS — BP 125/78 | HR 82 | Resp 14 | Ht 64.0 in | Wt 155.4 lb

## 2014-08-20 DIAGNOSIS — R430 Anosmia: Secondary | ICD-10-CM | POA: Insufficient documentation

## 2014-08-20 DIAGNOSIS — R51 Headache: Secondary | ICD-10-CM | POA: Insufficient documentation

## 2014-08-20 DIAGNOSIS — S069X5S Unspecified intracranial injury with loss of consciousness greater than 24 hours with return to pre-existing conscious level, sequela: Secondary | ICD-10-CM

## 2014-08-20 DIAGNOSIS — R41840 Attention and concentration deficit: Secondary | ICD-10-CM

## 2014-08-20 DIAGNOSIS — S069X0S Unspecified intracranial injury without loss of consciousness, sequela: Secondary | ICD-10-CM

## 2014-08-20 DIAGNOSIS — F068 Other specified mental disorders due to known physiological condition: Secondary | ICD-10-CM

## 2014-08-20 DIAGNOSIS — F411 Generalized anxiety disorder: Secondary | ICD-10-CM

## 2014-08-20 DIAGNOSIS — Z87820 Personal history of traumatic brain injury: Secondary | ICD-10-CM | POA: Insufficient documentation

## 2014-08-20 DIAGNOSIS — Z79899 Other long term (current) drug therapy: Secondary | ICD-10-CM

## 2014-08-20 DIAGNOSIS — S069X0D Unspecified intracranial injury without loss of consciousness, subsequent encounter: Secondary | ICD-10-CM

## 2014-08-20 DIAGNOSIS — R4189 Other symptoms and signs involving cognitive functions and awareness: Secondary | ICD-10-CM

## 2014-08-20 DIAGNOSIS — R42 Dizziness and giddiness: Secondary | ICD-10-CM | POA: Insufficient documentation

## 2014-08-20 DIAGNOSIS — Z5181 Encounter for therapeutic drug level monitoring: Secondary | ICD-10-CM

## 2014-08-20 MED ORDER — METHYLPHENIDATE HCL 10 MG PO TABS: 5.0000 mg | ORAL_TABLET | Freq: Three times a day (TID) | ORAL | Status: DC

## 2014-08-20 MED ORDER — SERTRALINE HCL 25 MG PO TABS: 25.0000 mg | ORAL_TABLET | Freq: Every day | ORAL | Status: DC

## 2014-08-20 MED ORDER — TOPIRAMATE 25 MG PO TABS: 25.0000 mg | ORAL_TABLET | Freq: Every day | ORAL | Status: DC

## 2014-08-20 NOTE — Patient Instructions (Signed)
BEGIN TOPAMAX AT 25MG  AT BEDTIME FOR 4 DAYS THEN INCREASE TO 50MG  (2 PILLS) THEREAFTER   START ZOLOFT IN ONE WEEK
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
## 2014-08-21 LAB — PMP ALCOHOL METABOLITE (ETG): Ethyl Glucuronide (EtG): NEGATIVE ng/mL

## 2014-08-23 LAB — BENZODIAZEPINES (GC/LC/MS), URINE
Alprazolam metabolite (GC/LC/MS), ur confirm: 1253 ng/mL — AB (ref <25)
CLONAZEPAU: NEGATIVE ng/mL (ref <25)
Flurazepam metabolite (GC/LC/MS), ur confirm: NEGATIVE ng/mL (ref <50)
Lorazepam (GC/LC/MS), ur confirm: NEGATIVE ng/mL (ref <50)
Midazolam (GC/LC/MS), ur confirm: NEGATIVE ng/mL (ref <50)
NORDIAZEPAMU: NEGATIVE ng/mL (ref <50)
Oxazepam (GC/LC/MS), ur confirm: NEGATIVE ng/mL (ref <50)
TRIAZOLAMU: NEGATIVE ng/mL (ref <50)
Temazepam (GC/LC/MS), ur confirm: NEGATIVE ng/mL (ref <50)

## 2014-08-23 LAB — OPIATES/OPIOIDS (LC/MS-MS)
CODEINE URINE: NEGATIVE ng/mL (ref <50)
HYDROCODONE: NEGATIVE ng/mL (ref <50)
HYDROMORPHONE: NEGATIVE ng/mL (ref <50)
MORPHINE: NEGATIVE ng/mL (ref <50)
NORHYDROCODONE, UR: NEGATIVE ng/mL (ref <50)
Noroxycodone, Ur: NEGATIVE ng/mL (ref <50)
Oxycodone, ur: NEGATIVE ng/mL (ref <50)
Oxymorphone: 1190 ng/mL — AB (ref <50)

## 2014-08-23 LAB — PRESCRIPTION MONITORING PROFILE (SOLSTAS)
BARBITURATE SCREEN, URINE: NEGATIVE ng/mL
CANNABINOID SCRN UR: NEGATIVE ng/mL
CREATININE, URINE: 167.5 mg/dL (ref >20.0)
Carisoprodol, Urine: NEGATIVE ng/mL
Cocaine Metabolites: NEGATIVE ng/mL
Fentanyl, Ur: NEGATIVE ng/mL
MDMA URINE: NEGATIVE ng/mL
Meperidine, Ur: NEGATIVE ng/mL
Methadone Screen, Urine: NEGATIVE ng/mL
Nitrites, Initial: NEGATIVE ug/mL
PH URINE, INITIAL: 5.4 pH (ref 4.5–8.9)
PROPOXYPHENE: NEGATIVE ng/mL
TAPENTADOLUR: NEGATIVE ng/mL
TRAMADOL UR: NEGATIVE ng/mL
Zolpidem, Urine: NEGATIVE ng/mL

## 2014-08-23 LAB — OXYCODONE, URINE (LC/MS-MS)
NOROXYCODONE, UR: NEGATIVE ng/mL (ref <50)
OXYMORPHONE, URINE: 1190 ng/mL — AB (ref <50)
Oxycodone, ur: NEGATIVE ng/mL (ref <50)

## 2014-08-23 LAB — BUPRENORPHINES (GC/LC/MS), URINE
Buprenorphine: 129 ng/mL — AB (ref <2)
NORBUPRENORPHINE: 523.8 ng/mL — AB (ref <2)

## 2014-08-23 LAB — AMPHETAMINES (GC/LC/MS), URINE
Amphetamine GC/MS Conf: 4416 ng/mL — AB (ref <250)
Methamphetamine Quant, Ur: NEGATIVE ng/mL (ref <250)

## 2014-09-20 ENCOUNTER — Telehealth: Payer: Self-pay | Admitting: *Deleted

## 2014-09-20 DIAGNOSIS — F411 Generalized anxiety disorder: Secondary | ICD-10-CM

## 2014-09-20 DIAGNOSIS — R41840 Attention and concentration deficit: Secondary | ICD-10-CM

## 2014-09-20 DIAGNOSIS — F068 Other specified mental disorders due to known physiological condition: Secondary | ICD-10-CM

## 2014-09-20 DIAGNOSIS — Z79899 Other long term (current) drug therapy: Secondary | ICD-10-CM

## 2014-09-20 DIAGNOSIS — Z5181 Encounter for therapeutic drug level monitoring: Secondary | ICD-10-CM

## 2014-09-20 DIAGNOSIS — S069X0D Unspecified intracranial injury without loss of consciousness, subsequent encounter: Secondary | ICD-10-CM

## 2014-09-20 DIAGNOSIS — S069X5S Unspecified intracranial injury with loss of consciousness greater than 24 hours with return to pre-existing conscious level, sequela: Secondary | ICD-10-CM

## 2014-09-20 DIAGNOSIS — S069X0S Unspecified intracranial injury without loss of consciousness, sequela: Secondary | ICD-10-CM

## 2014-09-20 MED ORDER — METHYLPHENIDATE HCL 10 MG PO TABS: 5.0000 mg | ORAL_TABLET | Freq: Three times a day (TID) | ORAL | Status: DC

## 2014-09-20 NOTE — Telephone Encounter (Signed)
Mr. Wanda Warner called on pt behalf, says pt needs a refill on her ritalin rx, her last appt 08/20/14, her next appt is 10/08/14 w/ Dr. Riley KillSwartz

## 2014-09-20 NOTE — Telephone Encounter (Signed)
done

## 2014-09-20 NOTE — Telephone Encounter (Signed)
Pt. Called, husband is on his way to pick up the script

## 2014-10-08 ENCOUNTER — Encounter: Payer: Self-pay | Admitting: Physical Medicine & Rehabilitation

## 2014-10-08 ENCOUNTER — Encounter: Payer: Medicaid Other | Attending: Physical Medicine & Rehabilitation | Admitting: Physical Medicine & Rehabilitation

## 2014-10-08 VITALS — BP 120/86 | HR 77 | Resp 14

## 2014-10-08 DIAGNOSIS — S069X0S Unspecified intracranial injury without loss of consciousness, sequela: Secondary | ICD-10-CM

## 2014-10-08 DIAGNOSIS — R51 Headache: Secondary | ICD-10-CM | POA: Insufficient documentation

## 2014-10-08 DIAGNOSIS — R4189 Other symptoms and signs involving cognitive functions and awareness: Secondary | ICD-10-CM | POA: Insufficient documentation

## 2014-10-08 DIAGNOSIS — R42 Dizziness and giddiness: Secondary | ICD-10-CM | POA: Insufficient documentation

## 2014-10-08 DIAGNOSIS — R430 Anosmia: Secondary | ICD-10-CM | POA: Insufficient documentation

## 2014-10-08 DIAGNOSIS — F411 Generalized anxiety disorder: Secondary | ICD-10-CM

## 2014-10-08 DIAGNOSIS — Z87820 Personal history of traumatic brain injury: Secondary | ICD-10-CM | POA: Insufficient documentation

## 2014-10-08 DIAGNOSIS — F068 Other specified mental disorders due to known physiological condition: Secondary | ICD-10-CM

## 2014-10-08 DIAGNOSIS — S069X5S Unspecified intracranial injury with loss of consciousness greater than 24 hours with return to pre-existing conscious level, sequela: Secondary | ICD-10-CM

## 2014-10-08 MED ORDER — DIVALPROEX SODIUM 250 MG PO DR TAB: 250.0000 mg | DELAYED_RELEASE_TABLET | Freq: Two times a day (BID) | ORAL | Status: DC

## 2014-10-08 MED ORDER — METHYLPHENIDATE HCL 10 MG PO TABS: 15.0000 mg | ORAL_TABLET | Freq: Three times a day (TID) | ORAL | Status: DC

## 2014-10-08 NOTE — Patient Instructions (Signed)
PLEASE CALL ME WITH ANY PROBLEMS OR QUESTIONS [phone]    TAKE THE DEPAKOTE AT NIGHT ONLY FOR ONE WEEK THEN TWICE DAILY THEREAFTER.

## 2014-10-08 NOTE — Progress Notes (Signed)
**Note Wanda-Identified via Obfuscation** Subjective:    Patient ID: Wanda Warner, female    DOB: [birth year], 33 y.o.   MRN: [id number]  HPI   Wanda Warner is back regarding her TBI. She hasn't gone back to [facility] due to issues with insurance. She will see them in January once her insurance kicks in.  We increased her ritalin to 10mg . She noticed a difference but it only works a few hours. She takes it at 9-10 am and then again at 12 noon and 4pm  The topamax has helped her headaches---she is on the 50mg  dose currently. It also has helped her sleep.   Zoloft is making her more emotional and tearful at times.   Pain Inventory Average Pain 5 Pain Right Now 5 My pain is sharp, burning, dull, stabbing and aching  In the last 24 hours, has pain interfered with the following? General activity 4 Relation with others 4 Enjoyment of life 4 What TIME of day is your pain at its worst? all Sleep (in general) NA  Pain is worse with: walking, bending, sitting and standing Pain improves with: rest, heat/ice and medication Relief from Meds: 3  Mobility walk without assistance  Function Do you have any goals in this area?  no  Neuro/Psych No problems in this area  Prior Studies Any changes since last visit?  no  Physicians involved in your care Any changes since last visit?  no   Family History  Problem Relation Age of Onset  . Hypertension Father   . Diabetes Maternal Grandmother    History   Social History  . Marital Status: Single    Spouse Name: N/A    Number of Children: N/A  . Years of Education: N/A   Social History Main Topics  . Smoking status: Current Every Day Smoker -- 0.05 packs/day for 10 years    Types: Cigarettes  . Smokeless tobacco: None  . Alcohol Use: No  . Drug Use: No  . Sexual Activity: Yes    Birth Control/ Protection: None   Other Topics Concern  . None   Social History Narrative   [place] in [place].    Hobbies: play solitaire and reading         Past Surgical History    Procedure Laterality Date  . Orthopedic surgery    . Induced abortion      10/26/11  . Craniotomy    . Fracture surgery    . Cranioplasty     Past Medical History  Diagnosis Date  . Traumatic brain injury 07/28/13  . Headache   . Loss of smell   . Arthritis     Knees and back   . Thoracic compression fracture   . Anxiety    BP 120/86 mmHg  Pulse 77  Resp 14  SpO2 99%  Opioid Risk Score:   Fall Risk Score: Low Fall Risk (0-5 points) (educated and given handout on fall prevention in the home)  Review of Systems  All other systems reviewed and are negative.      Objective:   Physical Exam   General: Alert and oriented x 3, No apparent distress. Wearing sun glasses  HEENT: Head is normocephalic, atraumatic, PERRLA, EOMI, sclera anicteric, oral mucosa pink and moist, dentition intact, ext ear canals clear,  Neck: Supple without JVD or lymphadenopathy  Heart: Reg rate and rhythm. No murmurs rubs or gallops  Chest: CTA bilaterally without wheezes, rales, or rhonchi; no distress  Abdomen: Soft, non-tender, non-distended, bowel sounds positive.  Extremities: No clubbing, cyanosis, or edema. Pulses are 2+  Skin: Clean and intact without signs of breakdown  Neuro: pt with 2 beats of nystagmus in lateral gaze either direction. Strength nearly symmetrical. Sensation intact. Decreased smell. Better focus but sometimes needs to be redirected.   Musculoskeletal: Full ROM, No pain with AROM or PROM in the neck, trunk, or extremities. Posture appropriate. Pain in mid back with rom. 1/2 right leg length discrepancy grossly  Psych: Pt's affect is very flat. Pleasant and cooperative however.    Assessment & Plan:   1. Hx of TBI (07/28/13) with persistent cognitive and behavioral deficits. Also, she's having ongoing headaches, occasional vertigo, anosmia.  2. Hx of T6 compression fracture---    Plan:  1. Increase ritalin to 15mg  bid to TID.  2. Continue topamax 50mg    3. Referral  to outpt SLP at neuro rehab for cognitive remediation.  4. Still consider referral at some point to neuropsychology  5. Stop zoloft due to anxiety and increased emotional. Begin a trial of depakote 250mg  bid for emotional lability and irritability . 6. Follow up with me in about a month. 30 minutes of face to face patient care time were spent during this visit. All questions were encouraged and answered.

## 2014-10-17 ENCOUNTER — Encounter: Payer: Self-pay | Admitting: Speech Pathology

## 2014-10-17 ENCOUNTER — Ambulatory Visit: Payer: 59 | Attending: Physical Medicine & Rehabilitation | Admitting: Speech Pathology

## 2014-10-17 DIAGNOSIS — R41841 Cognitive communication deficit: Secondary | ICD-10-CM | POA: Insufficient documentation

## 2014-10-17 NOTE — Patient Instructions (Addendum)
[email or web address] Neuronation app Solitaire Memory app Scrabble pics [employer] app Crosswords Lumosity Simon Timer for meds, app medication management  Day timer - put in appointments, b. Days, etc
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
## 2014-10-22 NOTE — Addendum Note (Signed)
Addended by: Ellen HenriLOVVORN, Seferina Brokaw A on: 10/22/2014 08:37 AM   Modules accepted: Orders

## 2014-10-24 ENCOUNTER — Ambulatory Visit: Payer: 59 | Admitting: Speech Pathology

## 2014-10-25 ENCOUNTER — Ambulatory Visit: Payer: 59

## 2014-10-30 ENCOUNTER — Ambulatory Visit: Payer: 59

## 2014-11-05 ENCOUNTER — Encounter: Payer: 59 | Attending: Physical Medicine & Rehabilitation | Admitting: Physical Medicine & Rehabilitation

## 2014-11-05 ENCOUNTER — Encounter: Payer: Self-pay | Admitting: Physical Medicine & Rehabilitation

## 2014-11-05 VITALS — BP 119/74 | HR 91 | Resp 14

## 2014-11-05 DIAGNOSIS — R42 Dizziness and giddiness: Secondary | ICD-10-CM | POA: Insufficient documentation

## 2014-11-05 DIAGNOSIS — R51 Headache: Secondary | ICD-10-CM | POA: Diagnosis not present

## 2014-11-05 DIAGNOSIS — G44309 Post-traumatic headache, unspecified, not intractable: Secondary | ICD-10-CM | POA: Diagnosis not present

## 2014-11-05 DIAGNOSIS — R4189 Other symptoms and signs involving cognitive functions and awareness: Secondary | ICD-10-CM

## 2014-11-05 DIAGNOSIS — S069X0S Unspecified intracranial injury without loss of consciousness, sequela: Secondary | ICD-10-CM

## 2014-11-05 DIAGNOSIS — S069X0D Unspecified intracranial injury without loss of consciousness, subsequent encounter: Secondary | ICD-10-CM | POA: Diagnosis not present

## 2014-11-05 DIAGNOSIS — S069X1S Unspecified intracranial injury with loss of consciousness of 30 minutes or less, sequela: Secondary | ICD-10-CM | POA: Diagnosis not present

## 2014-11-05 DIAGNOSIS — F411 Generalized anxiety disorder: Secondary | ICD-10-CM

## 2014-11-05 DIAGNOSIS — F068 Other specified mental disorders due to known physiological condition: Secondary | ICD-10-CM

## 2014-11-05 DIAGNOSIS — Z79899 Other long term (current) drug therapy: Secondary | ICD-10-CM

## 2014-11-05 DIAGNOSIS — S069X5S Unspecified intracranial injury with loss of consciousness greater than 24 hours with return to pre-existing conscious level, sequela: Secondary | ICD-10-CM

## 2014-11-05 DIAGNOSIS — Z87820 Personal history of traumatic brain injury: Secondary | ICD-10-CM | POA: Insufficient documentation

## 2014-11-05 DIAGNOSIS — R430 Anosmia: Secondary | ICD-10-CM | POA: Insufficient documentation

## 2014-11-05 DIAGNOSIS — Z5181 Encounter for therapeutic drug level monitoring: Secondary | ICD-10-CM

## 2014-11-05 DIAGNOSIS — R41840 Attention and concentration deficit: Secondary | ICD-10-CM

## 2014-11-05 MED ORDER — PROPRANOLOL HCL 20 MG PO TABS: 20.0000 mg | ORAL_TABLET | Freq: Two times a day (BID) | ORAL | Status: DC

## 2014-11-05 MED ORDER — TOPIRAMATE 50 MG PO TABS: 50.0000 mg | ORAL_TABLET | Freq: Every day | ORAL | Status: DC

## 2014-11-05 MED ORDER — ALPRAZOLAM 0.5 MG PO TABS: 0.5000 mg | ORAL_TABLET | Freq: Two times a day (BID) | ORAL | Status: DC | PRN

## 2014-11-05 MED ORDER — METHYLPHENIDATE HCL 10 MG PO TABS: 15.0000 mg | ORAL_TABLET | Freq: Three times a day (TID) | ORAL | Status: DC

## 2014-11-05 NOTE — Patient Instructions (Addendum)
PLEASE CALL ME WITH ANY PROBLEMS OR QUESTIONS [phone]).     CONTINUE TO TRY TO WORK WITH YOUR DAILY PLANNER/ORGANIZER  TRY TO FIND A SCHEDULE/USE AN ALARM TO MANAGE YOUR MEDICATIONS

## 2014-11-05 NOTE — Progress Notes (Signed)
**Note Wanda-Identified via Obfuscation** Subjective:    Patient ID: Wanda Warner, female    DOB: 02-Jun-1981, 34 y.o.   MRN: [id number]  HPI   Wanda Warner is here in follow up her TBI and associated post-concussion syndrome. She is doing well with the ritalin. She feels that the VPA is "counteracting" the ritalin and just making her sleepy. Her mood can still be irritable at times, and she has anxiety episodes as well.  She is trying to be more organized at home. She has a Pensions consultantplanner. She tried to keep up with her medications but was unsuccessful.   Her husband remains supportive. She has gone to a few sessions with SLP, but has missed the last few visit secondary to insurance hang ups. Therapy was focusing on attention, memory, and higher level cognitive tasks.    Pain Inventory Average Pain 5 Pain Right Now 4 My pain is sharp, burning and stabbing  In the last 24 hours, has pain interfered with the following? General activity 4 Relation with others 4 Enjoyment of life 4 What TIME of day is your pain at its worst? daytime Sleep (in general) Fair  Pain is worse with: walking, bending, sitting, inactivity and standing Pain improves with: no relief Relief from Meds: 0  Mobility walk without assistance ability to climb steps?  yes do you drive?  no  Function disabled: date disabled .  Neuro/Psych depression anxiety loss of taste or smell  Prior Studies Any changes since last visit?  no  Physicians involved in your care Any changes since last visit?  no   Family History  Problem Relation Age of Onset  . Hypertension Father   . Diabetes Maternal Grandmother    History   Social History  . Marital Status: Single    Spouse Name: N/A    Number of Children: N/A  . Years of Education: N/A   Social History Main Topics  . Smoking status: Current Every Day Smoker -- 0.05 packs/day for 10 years    Types: Cigarettes  . Smokeless tobacco: None  . Alcohol Use: No  . Drug Use: No  . Sexual Activity: Yes    Birth  Control/ Protection: None   Other Topics Concern  . None   Social History Narrative   [place]    Hobbies: play solitaire and reading         Past Surgical History  Procedure Laterality Date  . Orthopedic surgery    . Induced abortion      10/26/11  . Craniotomy    . Fracture surgery    . Cranioplasty     Past Medical History  Diagnosis Date  . Traumatic brain injury 07/28/13  . Headache   . Loss of smell   . Arthritis     Knees and back   . Thoracic compression fracture   . Anxiety    BP 119/74 mmHg  Pulse 91  Resp 14  SpO2 100%  Opioid Risk Score:   Fall Risk Score: Low Fall Risk (0-5 points)  Review of Systems  Constitutional:       Loss of smell  Psychiatric/Behavioral: Positive for dysphoric mood. The patient is nervous/anxious.   All other systems reviewed and are negative.      Objective:   Physical Exam General: Alert and oriented x 3, No apparent distress. Wearing sun glasses  HEENT: Head is normocephalic, atraumatic, PERRLA, EOMI, sclera anicteric, oral mucosa pink and moist, dentition intact, ext ear canals clear,  Neck: Supple without JVD  or lymphadenopathy  Heart: Reg rate and rhythm. No murmurs rubs or gallops  Chest: CTA bilaterally without wheezes, rales, or rhonchi; no distress  Abdomen: Soft, non-tender, non-distended, bowel sounds positive.  Extremities: No clubbing, cyanosis, or edema. Pulses are 2+  Skin: Clean and intact without signs of breakdown  Neuro:  Strength nearly symmetrical. Sensation intact.Improved attention and focus. seems to be able to stay on topic more than in the past. She makes good eye contact with me. short term memory showing improvement also as she forgot her "list " of questions but recalled many of things she wanted to ask me today after some extra time and consideration. Musculoskeletal: Full ROM, No pain with AROM or PROM in the neck, trunk, or extremities. Posture appropriate. Pain in mid  back with rom. 1/2 right leg length discrepancy grossly  Psych: Pt's affect is more animated. Pleasant and cooperative. A little anxious, non-agitated   Assessment & Plan:   1. Hx of TBI (07/28/13) with persistent cognitive and behavioral deficits. Also, she's having ongoing headaches, occasional vertigo, anosmia. Wanda Warner is showing gradual improvement with attention/memory and behavior. 2. Hx of T6 compression fracture-    Plan:  1. Continue ritalin  bid to TID.  2. Continue topamax  --refilled today 3.Continue SLP at neuro rehab for cognitive remediation.  4. Still consider referral at some point to neuropsychology for neuro-psych testing/coping skills 5. Stop vpa, begin trial of propranol  bid.---reviewed potential SE today 6. Add LOW dose xanax, 0.5mg  prn 12hrs for anxiety  6. Follow up with me in about a month. 30 minutes of face to face patient care time were spent during this visit. All questions were encouraged and answered.

## 2014-11-06 ENCOUNTER — Ambulatory Visit: Payer: 59

## 2014-11-06 ENCOUNTER — Telehealth: Payer: Self-pay | Admitting: Family Medicine

## 2014-11-06 DIAGNOSIS — S069X1D Unspecified intracranial injury with loss of consciousness of 30 minutes or less, subsequent encounter: Secondary | ICD-10-CM

## 2014-11-06 DIAGNOSIS — R41841 Cognitive communication deficit: Secondary | ICD-10-CM | POA: Diagnosis not present

## 2014-11-06 NOTE — Patient Instructions (Signed)
  Use a separate sheet for each dosage time (AM, PM, evening) for Wanda Warner to use when she fills med box (with coaching)

## 2014-11-06 NOTE — Telephone Encounter (Signed)
Referral is needed for Dr Melburn PopperZach Schwartz.  [facility] need this referral Please send to dr and the insurance

## 2014-11-06 NOTE — Therapy (Signed)
**Note Wanda-Identified via Obfuscation** [facility] [address] [place], [place], [address] Phone: [phone]   Fax:  [phone]  Speech Language Pathology Treatment  Patient Details  Name: Wanda Warner MRN: [id number] Date of Birth: 1981-06-20 Referring Provider:  Myra RudeSchmitz, Jeremy E, MD  Encounter Date: 11/06/2014      End of Session - 11/06/14 1349    Visit Number 2   Number of Visits 16   Date for SLP Re-Evaluation 12/12/14   SLP Start Time 1317   SLP Stop Time  1400   SLP Time Calculation (min) 43 min      Past Medical History  Diagnosis Date  . Traumatic brain injury 07/28/13  . Headache   . Loss of smell   . Arthritis     Knees and back   . Thoracic compression fracture   . Anxiety     Past Surgical History  Procedure Laterality Date  . Orthopedic surgery    . Induced abortion      10/26/11  . Craniotomy    . Fracture surgery    . Cranioplasty      There were no vitals taken for this visit.  Visit Diagnosis: Cognitive communication deficit      Subjective Assessment - 11/06/14 1321    Symptoms "It's all messed up - it's a mess." (re: med organization list)             ADULT SLP TREATMENT - 11/06/14 1328    General Information   Behavior/Cognition Cooperative;Pleasant mood   Treatment Provided   Treatment provided Cognitive-Linquistic   Pain Assessment   Pain Assessment 0-10   Pain Score 5    Pain Location --  headache   Pain Descriptors / Indicators Headache   Pain Intervention(s) Monitored during session   Cognitive-Linquistic Treatment   Treatment focused on Cognition   Skilled Treatment SLP assessed pt's success with current med system and SLP suggested pt fill med box with sheet for each dosage time (AM, afternoon, evening). Husband to "coach" pt with this. Pt has her organizer/planner and is going to bring to therapy next time for SLP to assist pt with problem solving possibilities related to her current  system.  Alternating attention between simple written categorization task and simple verbal task (total change given coin combinations verbally): 100% success with rare min A and extra time provided.   Assessment / Recommendations / Plan   Plan Continue with current plan of care   Progression Toward Goals   Progression toward goals Progressing toward goals          SLP Education - 11/06/14 1348    Education provided Yes   Education Details med administration suggestions   Person(s) Educated Patient;Spouse   Methods Explanation   Comprehension Verbalized understanding          SLP Short Term Goals - 11/06/14 1423    SLP SHORT TERM GOAL #1   Status Achieved   SLP SHORT TERM GOAL #2   Status On-going   SLP SHORT TERM GOAL #3   Status On-going          SLP Long Term Goals - 11/06/14 1423    SLP LONG TERM GOAL #1   Status On-going   SLP LONG TERM GOAL #2   Status On-going   SLP LONG TERM GOAL #3   Status On-going          Plan - 11/06/14 1350    Clinical Impression Statement pt cont to exhibit  difficulties with attention and other higher level cognitive-linguistic tasks inhibiting pt's independence.   Speech Therapy Frequency 2x / week   Duration 4 weeks   Treatment/Interventions Compensatory strategies;Cueing hierarchy;Functional tasks;Patient/family education;Cognitive reorganization;Environmental controls;SLP instruction and feedback;Internal/external aids   Potential to Achieve Goals Good   Potential Considerations Ability to learn/carryover information        Problem List Patient Active Problem List   Diagnosis Date Noted  . Cognitive deficit as late effect of traumatic brain injury 08/20/2014  . Headache 07/24/2014  . Fatigue 07/24/2014  . Generalized anxiety disorder 05/02/2014  . TBI (traumatic brain injury) 05/02/2014    [facility], SLP 11/06/2014, 2:24 PM  [facility] [address]  [address] [place], [place], [address] Phone: [phone]   Fax:  [phone]

## 2014-11-07 ENCOUNTER — Telehealth: Payer: Self-pay | Admitting: *Deleted

## 2014-11-07 NOTE — Telephone Encounter (Signed)
Referral made to Dr. Kennieth RadSwartz   Keziah Avis E Tonia Avino, MD PGY-2, [facility] 11/07/2014, 12:19 PM

## 2014-11-07 NOTE — Telephone Encounter (Signed)
Husband called said we contacted him asking to reschedule the pt's appt on feb 26th and is asking us to please call back. I conferred with Johnette and she confirmed that Dr. Riley KillSwartz was not available the 25th and 26th of February...Marland Kitchen.Marland Kitchen.I called the pt back....passed on the information....the Feb 26th appt was cancelled and a new appt was made on the 24th of feb

## 2014-11-08 ENCOUNTER — Ambulatory Visit: Payer: 59

## 2014-11-08 DIAGNOSIS — R41841 Cognitive communication deficit: Secondary | ICD-10-CM

## 2014-11-08 NOTE — Therapy (Signed)
[facility] [address] [place], [place], [address] Phone: [phone]   Fax:  [phone]  Speech Language Pathology Treatment  Patient Details  Name: De BlanchRita Gillyard MRN: [id number] Date of Birth: Sep 04, 1981 Referring Provider:  Myra RudeSchmitz, Jeremy E, MD  Encounter Date: 11/08/2014      End of Session - 11/08/14 1401    Visit Number 3   Number of Visits 16   Date for SLP Re-Evaluation 12/12/14   SLP Start Time 1316   SLP Stop Time  1358   SLP Time Calculation (min) 42 min   Activity Tolerance Patient tolerated treatment well      Past Medical History  Diagnosis Date  . Traumatic brain injury 07/28/13  . Headache   . Loss of smell   . Arthritis     Knees and back   . Thoracic compression fracture   . Anxiety     Past Surgical History  Procedure Laterality Date  . Orthopedic surgery    . Induced abortion      10/26/11  . Craniotomy    . Fracture surgery    . Cranioplasty      There were no vitals taken for this visit.  Visit Diagnosis: Cognitive communication deficit      Subjective Assessment - 11/08/14 1349    Symptoms "This (talent show schedule) was hard."             ADULT SLP TREATMENT - 11/08/14 1349    General Information   Behavior/Cognition Cooperative;Pleasant mood   Treatment Provided   Treatment provided Cognitive-Linquistic   Pain Assessment   Pain Assessment 0-10   Pain Score 5    Pain Location rt temple   Pain Descriptors / Indicators Constant   Pain Intervention(s) Monitored during session   Cognitive-Linquistic Treatment   Treatment focused on Cognition   Skilled Treatment Pt brought med schedule and planner for SLP to see (pt remembered using a compensation spontaneously. SLP educated pt/fiancee Vincenza Hews(Shane) re: what type med organizer would be best for pt (three container/day) in order to keep simplicity instead of med checklist. SLP assisted pt in correcting pt homework  with mod A occasionally req'd for accuracy.    Assessment / Recommendations / Plan   Plan Continue with current plan of care   Progression Toward Goals   Progression toward goals Progressing toward goals            SLP Short Term Goals - 11/08/14 1402    SLP SHORT TERM GOAL #1   Title Pt will alternate attention between 2 simple cogntive linguistic tasks with 80% on each and occassional minimal assistance   Time 4   Period Weeks   Status Achieved   SLP SHORT TERM GOAL #2   Title Pt will utlize external aids for schedule and medication management   Time 4   Period Weeks   Status On-going   SLP SHORT TERM GOAL #3   Title Pt will perform simple executive function tasks with 80% accuracy with occassional minimal assistance   Time 4   Period Weeks   Status On-going          SLP Long Term Goals - 11/08/14 1402    SLP LONG TERM GOAL #1   Title Pt will alternate attention between 2 moderately complex tasks with 85% accuracy and rare minimal assistance   Time 8   Period Weeks   Status On-going   SLP LONG TERM GOAL #2   Title  Pt will manage medication, schedule, and daily chores with external aids with rare minimal assistance   Time 8   Period Weeks   Status On-going   SLP LONG TERM GOAL #3   Title Pt will perform executive function tasks with 85% accuracy and rare minimal asstance   Time 8   Period Weeks   Status On-going          Plan - 11/08/14 1402    Clinical Impression Statement pt cont to exhibit difficulties with attention, attention to detail, and other higher level cognitive-linguistic tasks inhibiting pt's independence.   Speech Therapy Frequency 2x / week   Duration 4 weeks   Treatment/Interventions Compensatory strategies;Cueing hierarchy;Functional tasks;Patient/family education;Cognitive reorganization;Environmental controls;SLP instruction and feedback;Internal/external aids   Potential to Achieve Goals Good   Potential Considerations Ability to  learn/carryover information        Problem List Patient Active Problem List   Diagnosis Date Noted  . Cognitive deficit as late effect of traumatic brain injury 08/20/2014  . Headache 07/24/2014  . Fatigue 07/24/2014  . Generalized anxiety disorder 05/02/2014  . TBI (traumatic brain injury) 05/02/2014    [facility] SLP 11/08/2014, 2:03 PM  [facility] [address] [place], [place], [address] Phone: [phone]   Fax:  [phone]

## 2014-11-08 NOTE — Patient Instructions (Signed)
  Please complete the assigned speech therapy homework and return it to your next session.  

## 2014-11-13 ENCOUNTER — Ambulatory Visit: Payer: 59 | Attending: Physical Medicine & Rehabilitation

## 2014-11-13 DIAGNOSIS — R41841 Cognitive communication deficit: Secondary | ICD-10-CM | POA: Diagnosis present

## 2014-11-13 NOTE — Therapy (Signed)
[facility] [address] [place], [place], [address] Phone: [phone]   Fax:  [phone]  Speech Language Pathology Treatment  Patient Details  Name: Seleena Reimers MRN: [id number] Date of Birth: 1981-03-31 Referring Provider:  [name], MD  Encounter Date: 11/13/2014      End of Session - 11/13/14 1445    Visit Number 4   Number of Visits 16   Date for SLP Re-Evaluation 12/12/14   SLP Start Time 1319   SLP Stop Time  1401   SLP Time Calculation (min) 42 min   Activity Tolerance Patient tolerated treatment well      Past Medical History  Diagnosis Date  . Traumatic brain injury 07/28/13  . Headache   . Loss of smell   . Arthritis     Knees and back   . Thoracic compression fracture   . Anxiety     Past Surgical History  Procedure Laterality Date  . Orthopedic surgery    . Induced abortion      10/26/11  . Craniotomy    . Fracture surgery    . Cranioplasty      There were no vitals taken for this visit.  Visit Diagnosis: Cognitive communication deficit      Subjective Assessment - 11/13/14 1323    Symptoms Pt keeping med box she has currently. Current plan works with occasional min A.              ADULT SLP TREATMENT - 11/13/14 1325    General Information   Behavior/Cognition Cooperative;Pleasant mood   Treatment Provided   Treatment provided Cognitive-Linquistic   Pain Assessment   Pain Assessment 0-10   Pain Score 6    Pain Location back   Pain Descriptors / Indicators Stabbing   Pain Intervention(s) Monitored during session   Cognitive-Linquistic Treatment   Treatment focused on Cognition   Skilled Treatment Emergent awareness demo'd as a deficit due to errors pt unaware. Did not double check detailed instructions.  Simple executive function task (grocery list for two days - pt writing shopping list) with rare mod A faded to rare min A   Assessment / Recommendations /  Plan   Plan Continue with current plan of care   Progression Toward Goals   Progression toward goals Progressing toward goals            SLP Short Term Goals - 11/13/14 1327    SLP SHORT TERM GOAL #1   Title Pt will alternate attention between 2 simple cogntive linguistic tasks with 80% on each and occassional minimal assistance   Time --   Period --   Status Achieved   SLP SHORT TERM GOAL #2   Title Pt will utlize external aids for schedule and medication management   Status Achieved   SLP SHORT TERM GOAL #3   Title Pt will perform simple executive function tasks with 80% accuracy with occassional minimal assistance   Time 4   Period Weeks   Status On-going          SLP Long Term Goals - 11/13/14 1447    SLP LONG TERM GOAL #1   Title Pt will alternate attention between 2 moderately complex tasks with 85% accuracy and rare minimal assistance   Time 8   Period Weeks   Status On-going   SLP LONG TERM GOAL #2   Title Pt will manage medication, schedule, and daily chores with external aids with rare minimal assistance  Time 8   Period Weeks   Status On-going   SLP LONG TERM GOAL #3   Title Pt will perform executive function tasks with 85% accuracy and rare minimal asstance   Time 8   Period Weeks   Status On-going   SLP LONG TERM GOAL #4   Title pt to divide attention in simple tasks 50% of the time with 70% success on both tasks.   Time 8   Period Weeks   Status New          Plan - 11/13/14 1446    Clinical Impression Statement pt cont to exhibit difficulties with attention, attention to detail, and other higher level cognitive-linguistic tasks inhibiting pt's independence.   Speech Therapy Frequency 2x / week   Treatment/Interventions Compensatory strategies;Cueing hierarchy;Functional tasks;Patient/family education;Cognitive reorganization;Environmental controls;SLP instruction and feedback;Internal/external aids   Potential to Achieve Goals Good         Problem List Patient Active Problem List   Diagnosis Date Noted  . Cognitive deficit as late effect of traumatic brain injury 08/20/2014  . Headache 07/24/2014  . Fatigue 07/24/2014  . Generalized anxiety disorder 05/02/2014  . TBI (traumatic brain injury) 05/02/2014    [facility]Trevaris Pennella, SLP 11/13/2014, 2:48 PM  [facility] [address] [place], [place], [address] Phone: [phone]   Fax:  [phone]

## 2014-11-15 ENCOUNTER — Ambulatory Visit: Payer: 59

## 2014-11-15 DIAGNOSIS — R41841 Cognitive communication deficit: Secondary | ICD-10-CM | POA: Diagnosis not present

## 2014-11-15 NOTE — Therapy (Signed)
[facility] [address] [place], [place], [address] Phone: [phone]   Fax:  [phone]  Speech Language Pathology Treatment  Patient Details  Name: Wanda Warner MRN: [id number] Date of Birth: [birth year] Referring Provider:  [name], MD  Encounter Date: 11/15/2014      End of Session - 11/15/14 1622    Visit Number 5   Number of Visits 16   Date for SLP Re-Evaluation 12/12/14   SLP Start Time 1535   SLP Stop Time  1615   SLP Time Calculation (min) 40 min   Activity Tolerance Patient tolerated treatment well      Past Medical History  Diagnosis Date  . Traumatic brain injury 07/28/13  . Headache   . Loss of smell   . Arthritis     Knees and back   . Thoracic compression fracture   . Anxiety     Past Surgical History  Procedure Laterality Date  . Orthopedic surgery    . Induced abortion      10/26/11  . Craniotomy    . Fracture surgery    . Cranioplasty      There were no vitals taken for this visit.  Visit Diagnosis: Cognitive communication deficit           ADULT SLP TREATMENT - 11/15/14 1539    General Information   Behavior/Cognition Alert;Cooperative;Pleasant mood   Treatment Provided   Treatment provided Cognitive-Linquistic   Pain Assessment   Pain Assessment 0-10   Pain Score 6    Pain Location back   Pain Descriptors / Indicators Throbbing   Pain Intervention(s) Monitored during session   Cognitive-Linquistic Treatment   Treatment focused on Cognition   Skilled Treatment Pt needed occasional min-mod cues needed to plan in simple 8-step executive function tasks.  She did not note errors in deductive reasoning task.    Assessment / Recommendations / Plan   Plan Continue with current plan of care   Progression Toward Goals   Progression toward goals Progressing toward goals            SLP Short Term Goals - 11/15/14 1624    SLP SHORT TERM GOAL #1   Title Pt  will alternate attention between 2 simple cogntive linguistic tasks with 80% on each and occassional minimal assistance   Status Achieved   SLP SHORT TERM GOAL #2   Title Pt will utlize external aids for schedule and medication management   Status Achieved   SLP SHORT TERM GOAL #3   Title Pt will perform simple executive function tasks with 80% accuracy with occassional minimal assistance   Time 3   Period Weeks   Status On-going          SLP Long Term Goals - 11/15/14 1631    SLP LONG TERM GOAL #1   Title Pt will alternate attention between 2 moderately complex tasks with 85% accuracy and rare minimal assistance   Time 7   Period Weeks   Status On-going   SLP LONG TERM GOAL #2   Title Pt will manage medication, schedule, and daily chores with external aids with rare minimal assistance   Time 7   Period Weeks   Status On-going   SLP LONG TERM GOAL #3   Title Pt will perform executive function tasks with 85% accuracy and rare minimal asstance   Time 7   Period Weeks   Status On-going   SLP LONG TERM GOAL #4  Title pt to divide attention in simple tasks 50% of the time with 70% success on both tasks.   Time 7   Period Weeks   Status New          Plan - 11/15/14 1622    Clinical Impression Statement pt cont to exhibit difficulties with attention, attention to detail, and other higher level cognitive-linguistic tasks inhibiting pt's independence.   Speech Therapy Frequency 2x / week   Duration --  3 weeks   Treatment/Interventions Compensatory strategies;Cueing hierarchy;Functional tasks;Patient/family education;Cognitive reorganization;Environmental controls;SLP instruction and feedback;Internal/external aids   Potential to Achieve Goals Good   Potential Considerations Ability to learn/carryover information        Problem List Patient Active Problem List   Diagnosis Date Noted  . Cognitive deficit as late effect of traumatic brain injury 08/20/2014  . Headache  07/24/2014  . Fatigue 07/24/2014  . Generalized anxiety disorder 05/02/2014  . TBI (traumatic brain injury) 05/02/2014    [facility]Brooklinn Longbottom, SLP 11/15/2014, 4:31 PM  [facility] [address] [place], [place], [address] Phone: [phone]   Fax:  [phone]

## 2014-11-18 ENCOUNTER — Telehealth: Payer: Self-pay | Admitting: *Deleted

## 2014-11-18 NOTE — Telephone Encounter (Signed)
Recd letter from [employer] stating that the Methylphenidate HCL has been approved through 11/07/2015

## 2014-11-20 ENCOUNTER — Ambulatory Visit: Payer: 59

## 2014-11-20 DIAGNOSIS — R41841 Cognitive communication deficit: Secondary | ICD-10-CM | POA: Diagnosis not present

## 2014-11-20 NOTE — Patient Instructions (Signed)
Purchase 3-divider/day med Dance movement psychotherapistorganizer

## 2014-11-20 NOTE — Therapy (Signed)
**Note Wanda-Identified via Obfuscation** [facility] [address] [place], [place], [address] Phone: [phone]   Fax:  [phone]  Speech Language Pathology Treatment  Patient Details  Name: Wanda Warner MRN: [id number] Date of Birth: 1981/08/24 Referring Provider:  Myra RudeSchmitz, Jeremy E, MD  Encounter Date: 11/20/2014      End of Session - 11/20/14 1647    Visit Number 6   Number of Visits 16   Date for SLP Re-Evaluation 12/12/14   SLP Start Time 1405   SLP Stop Time  1445   SLP Time Calculation (min) 40 min   Activity Tolerance Patient limited by pain      Past Medical History  Diagnosis Date  . Traumatic brain injury 07/28/13  . Headache   . Loss of smell   . Arthritis     Knees and back   . Thoracic compression fracture   . Anxiety     Past Surgical History  Procedure Laterality Date  . Orthopedic surgery    . Induced abortion      10/26/11  . Craniotomy    . Fracture surgery    . Cranioplasty      There were no vitals taken for this visit.  Visit Diagnosis: Cognitive communication deficit      Subjective Assessment - 11/20/14 1403    Symptoms "I have a hard time with (the sequencing homework)"              ADULT SLP TREATMENT - 11/20/14 1405    General Information   Behavior/Cognition Alert;Cooperative   Treatment Provided   Treatment provided Cognitive-Linquistic   Pain Assessment   Pain Assessment 0-10   Pain Score 5    Pain Location rt mid back   Pain Descriptors / Indicators Stabbing   Pain Intervention(s) Monitored during session   Cognitive-Linquistic Treatment   Treatment focused on Cognition   Skilled Treatment Pt's pain incr'd to 6 by end of session but she declined anything be done about it during session. Pt req'd mod A with deductive reasoning task (simple) - pt with dyslexia. Pt's homework to organize 8 steps in mod complex sequences req'd usual min A from caregiver Vincenza HewsShane. Pt sequenced  6-step sequence  cards with extra time consistently and rare min cues. Problem solved with pt/Shane re: plan B for med administration as pt missed midday med administration using current system which SLP thought may have been too complex. Pt/Shane/SLP agree three-cup med organizer may be best plan at this time. They are to stop today to purchase.   Assessment / Recommendations / Plan   Plan Continue with current plan of care   Progression Toward Goals   Progression toward goals Progressing toward goals          SLP Education - 11/20/14 1437    Education provided Yes   Education Details revisiting med administration as pt forgot midday attention meds          SLP Short Term Goals - 11/20/14 1649    SLP SHORT TERM GOAL #1   Title Pt will alternate attention between 2 simple cogntive linguistic tasks with 80% on each and occassional minimal assistance   Status Achieved   SLP SHORT TERM GOAL #2   Title Pt will utlize external aids for schedule and medication management   Status Achieved   SLP SHORT TERM GOAL #3   Title Pt will perform simple executive function tasks with 80% accuracy with occassional minimal assistance   Time 2  Period Weeks   Status On-going          SLP Long Term Goals - 11/20/14 1649    SLP LONG TERM GOAL #1   Title Pt will alternate attention between 2 moderately complex tasks with 85% accuracy and rare minimal assistance   Time 6   Period Weeks   Status On-going   SLP LONG TERM GOAL #2   Title Pt will manage medication, schedule, and daily chores with external aids with rare minimal assistance   Time 6   Period Weeks   Status On-going   SLP LONG TERM GOAL #3   Title Pt will perform executive function tasks with 85% accuracy and rare minimal asstance   Time 6   Period Weeks   Status On-going   SLP LONG TERM GOAL #4   Title pt to divide attention in simple tasks 50% of the time with 70% success on both tasks.   Time 6   Period Weeks   Status On-going           Plan - 11/20/14 1649    Duration --  6 weeks        Problem List Patient Active Problem List   Diagnosis Date Noted  . Cognitive deficit as late effect of traumatic brain injury 08/20/2014  . Headache 07/24/2014  . Fatigue 07/24/2014  . Generalized anxiety disorder 05/02/2014  . TBI (traumatic brain injury) 05/02/2014    [facility], SLP 11/20/2014, 4:50 PM  [facility] [address] [place], [place], [address] Phone: [phone]   Fax:  [phone]

## 2014-11-22 ENCOUNTER — Ambulatory Visit: Payer: 59

## 2014-11-22 DIAGNOSIS — R41841 Cognitive communication deficit: Secondary | ICD-10-CM

## 2014-11-22 NOTE — Patient Instructions (Signed)
  Please complete the assigned speech therapy homework and return it to your next session.  

## 2014-11-22 NOTE — Therapy (Signed)
[facility] [address] [place], [place], [address] Phone: [phone]   Fax:  [phone]  Speech Language Pathology Treatment  Patient Details  Name: Wanda Warner MRN: [id number] Date of Birth: [birth year] Referring Provider:  [name], MD  Encounter Date: 11/22/2014      End of Session - 11/22/14 1449    Visit Number 7   Number of Visits 16   Date for SLP Re-Evaluation 12/12/14   SLP Start Time 1402   SLP Stop Time  1446   SLP Time Calculation (min) 44 min   Activity Tolerance Patient limited by pain      Past Medical History  Diagnosis Date  . Traumatic brain injury 07/28/13  . Headache   . Loss of smell   . Arthritis     Knees and back   . Thoracic compression fracture   . Anxiety     Past Surgical History  Procedure Laterality Date  . Orthopedic surgery    . Induced abortion      10/26/11  . Craniotomy    . Fracture surgery    . Cranioplasty      There were no vitals taken for this visit.  Visit Diagnosis: Cognitive communication deficit           ADULT SLP TREATMENT - 11/22/14 1406    General Information   Behavior/Cognition Alert;Cooperative;Pleasant mood   Treatment Provided   Treatment provided Cognitive-Linquistic   Pain Assessment   Pain Assessment 0-10   Pain Score 6    Pain Location --  mid-low back   Pain Descriptors / Indicators Stabbing   Pain Intervention(s) Monitored during session   Cognitive-Linquistic Treatment   Treatment focused on Cognition   Skilled Treatment Pt brought her heating pad with her today. Pt with new med box, encouraged pt to fill it with Shane's help and SLP described simplest way to do so with Vincenza Hews watching pt. Divided attention tasks (auditory and written) - pt is normally hypersensitive to noise and relates she would have done written task quicker if radio off.  Success was 80% and 90% (written and auditory).    Assessment /  Recommendations / Plan   Plan Continue with current plan of care   Progression Toward Goals   Progression toward goals Progressing toward goals            SLP Short Term Goals - 11/20/14 1649    SLP SHORT TERM GOAL #1   Title Pt will alternate attention between 2 simple cogntive linguistic tasks with 80% on each and occassional minimal assistance   Status Achieved   SLP SHORT TERM GOAL #2   Title Pt will utlize external aids for schedule and medication management   Status Achieved   SLP SHORT TERM GOAL #3   Title Pt will perform simple executive function tasks with 80% accuracy with occassional minimal assistance   Time 2   Period Weeks   Status On-going          SLP Long Term Goals - 11/20/14 1649    SLP LONG TERM GOAL #1   Title Pt will alternate attention between 2 moderately complex tasks with 85% accuracy and rare minimal assistance   Time 6   Period Weeks   Status On-going   SLP LONG TERM GOAL #2   Title Pt will manage medication, schedule, and daily chores with external aids with rare minimal assistance   Time 6   Period Weeks  Status On-going   SLP LONG TERM GOAL #3   Title Pt will perform executive function tasks with 85% accuracy and rare minimal asstance   Time 6   Period Weeks   Status On-going   SLP LONG TERM GOAL #4   Title pt to divide attention in simple tasks 50% of the time with 70% success on both tasks.   Time 6   Period Weeks   Status On-going          Plan - 11/22/14 1449    Clinical Impression Statement pt cont to exhibit some difficulties with attention, attention to detail, and other higher level cognitive-linguistic tasks inhibiting pt's independence. Pt's anxiety is possible factor negatively affecting pt performance.   Speech Therapy Frequency 2x / week   Duration --  6 weeks   Treatment/Interventions Compensatory strategies;Cueing hierarchy;Functional tasks;Patient/family education;Cognitive reorganization;Environmental  controls;SLP instruction and feedback;Internal/external aids   Potential to Achieve Goals Good   Potential Considerations Ability to learn/carryover information;Severity of impairments        Problem List Patient Active Problem List   Diagnosis Date Noted  . Cognitive deficit as late effect of traumatic brain injury 08/20/2014  . Headache 07/24/2014  . Fatigue 07/24/2014  . Generalized anxiety disorder 05/02/2014  . TBI (traumatic brain injury) 05/02/2014    [facility]Barlow Harrison, SLP 11/22/2014, 2:53 PM  [facility] [address] [place], [place], [address] Phone: [phone]   Fax:  [phone]

## 2014-11-27 ENCOUNTER — Ambulatory Visit: Payer: 59

## 2014-11-27 DIAGNOSIS — R41841 Cognitive communication deficit: Secondary | ICD-10-CM | POA: Diagnosis not present

## 2014-11-27 NOTE — Therapy (Signed)
**Note Wanda-Identified via Obfuscation** [facility] [address] [place], [place], [address] Phone: [phone]   Fax:  [phone]  Speech Language Pathology Treatment  Patient Details  Name: Wanda BlanchRita Deshler MRN: [id number] Date of Birth: Sep 12, 1981 Referring Provider:  Myra RudeSchmitz, Jeremy E, MD  Encounter Date: 11/27/2014      End of Session - 11/27/14 1528    Activity Tolerance Patient tolerated treatment well      Past Medical History  Diagnosis Date  . Traumatic brain injury 07/28/13  . Headache   . Loss of smell   . Arthritis     Knees and back   . Thoracic compression fracture   . Anxiety     Past Surgical History  Procedure Laterality Date  . Orthopedic surgery    . Induced abortion      10/26/11  . Craniotomy    . Fracture surgery    . Cranioplasty      There were no vitals taken for this visit.  Visit Diagnosis: Cognitive communication deficit      Subjective Assessment - 11/27/14 1407    Symptoms "(the meds) are working right now."             ADULT SLP TREATMENT - 11/27/14 1408    General Information   Behavior/Cognition Alert;Cooperative;Pleasant mood   Treatment Provided   Treatment provided Cognitive-Linquistic   Pain Assessment   Pain Assessment 0-10   Pain Score 5    Pain Location mid-lower back   Pain Descriptors / Indicators Aching;Throbbing   Pain Intervention(s) Monitored during session   Cognitive-Linquistic Treatment   Treatment focused on Cognition   Skilled Treatment Pt brought her heating pad in to therapy again. Reviewed homeowrk with pt and SLP rare min A. Divided attention tasks (auditory, written tasks) with average 88% success auditorily and 90% written (simple task). Pt is utilizing her planner and it is successful for her. Pt also has used a "sticky-note" system which also works well when pt recalls something she needs to remember to tell fiance.     Assessment / Recommendations / Plan   Plan Continue  with current plan of care   Progression Toward Goals   Progression toward goals Progressing toward goals          SLP Education - 11/27/14 1527    Education Details divided attention tasks   Person(s) Educated Patient;Caregiver(s)   Methods Explanation   Comprehension Verbalized understanding          SLP Short Term Goals - 11/27/14 1432    SLP SHORT TERM GOAL #3   Title Pt will perform simple executive function tasks with 80% accuracy with occassional minimal assistance   Time 1   Period Weeks   Status On-going          SLP Long Term Goals - 11/27/14 1444    SLP LONG TERM GOAL #1   Title Pt will alternate attention between 2 moderately complex tasks with 85% accuracy and rare minimal assistance   Time 5   Period Weeks   Status On-going   SLP LONG TERM GOAL #2   Title Pt will manage medication, schedule, and daily chores with external aids with rare minimal assistance   Status Achieved          Plan - 11/27/14 1528    Clinical Impression Statement divided attention in simple tasks is functional. Pt has begun to use memory notebook/planner with success. Skilled ST needed to cont due to reduced executive function  and divided attention skills (especially with higher level tasks)   Speech Therapy Frequency 2x / week   Duration --  5 weeks   Treatment/Interventions Compensatory strategies;Cueing hierarchy;Functional tasks;Patient/family education;Cognitive reorganization;Environmental controls;SLP instruction and feedback;Internal/external aids   Potential to Achieve Goals Good        Problem List Patient Active Problem List   Diagnosis Date Noted  . Cognitive deficit as late effect of traumatic brain injury 08/20/2014  . Headache 07/24/2014  . Fatigue 07/24/2014  . Generalized anxiety disorder 05/02/2014  . TBI (traumatic brain injury) 05/02/2014    [facility], SLP 11/27/2014, 3:30 PM  [facility] [address] [place], [place], [address] Phone: [phone]   Fax:  [phone]

## 2014-11-27 NOTE — Patient Instructions (Addendum)
Divided Attention ideas - any can be done together  Playing a card game either by yourself or with someone  Playing a board game with someone  Writing down commercials on TV or the radio  Writing down news stories on TV or radio  Working outside with yard work, gardening, etc.  Washing dishes  Recite state names and capitals  Complete long division or 2 or 3 digit multiplication problems  Listen to a speech online (i.e., YouTube), to online news (NPR, CNN, etc.), or to a family member, and give pertinent information from the speech, news story, or conversation after it is over  Empty the dishwasher  Complete paper/pencil puzzles (sudoku, crosswords, word search)  Complete puzzles online (www.lumosity.com -computer/tablet based, or www.constanttherapy.com - iPad based)  Therapy worksheets/exercises  Put together a puzzle  State coins needed to make a certain change amount  Have someone read a portion of a novel to you and you give a summary    Start at a level that you are challenged by, but also where you can see some success 

## 2014-11-29 ENCOUNTER — Ambulatory Visit: Payer: 59

## 2014-11-29 DIAGNOSIS — R41841 Cognitive communication deficit: Secondary | ICD-10-CM | POA: Diagnosis not present

## 2014-11-29 NOTE — Therapy (Signed)
[facility] [address] [place], [place], [address] Phone: [phone]   Fax:  [phone]  Speech Language Pathology Treatment  Patient Details  Name: Wanda Warner MRN: [id number] Date of Birth: 1981-07-10 Referring Provider:  [name], MD  Encounter Date: 11/29/2014      End of Session - 11/29/14 1657    Visit Number 9   Number of Visits 16   Date for SLP Re-Evaluation 12/12/14   SLP Start Time 1403   SLP Stop Time  1447   SLP Time Calculation (min) 44 min   Activity Tolerance Patient tolerated treatment well      Past Medical History  Diagnosis Date  . Traumatic brain injury 07/28/13  . Headache   . Loss of smell   . Arthritis     Knees and back   . Thoracic compression fracture   . Anxiety     Past Surgical History  Procedure Laterality Date  . Orthopedic surgery    . Induced abortion      10/26/11  . Craniotomy    . Fracture surgery    . Cranioplasty      There were no vitals taken for this visit.  Visit Diagnosis: No diagnosis found.      Subjective Assessment - 11/29/14 1413    Symptoms Pt brought heating pad             ADULT SLP TREATMENT - 11/29/14 1415    General Information   Behavior/Cognition Alert;Cooperative;Pleasant mood   Pain Assessment   Pain Assessment 0-10   Pain Score 5    Pain Location mid-low back   Pain Descriptors / Indicators Aching;Throbbing   Pain Intervention(s) Monitored during session   Cognitive-Linquistic Treatment   Treatment focused on Cognition   Skilled Treatment Alternating attention tasks completed today (written and conversation) with 95% success. Spoke with pt/fiance re: follow up after d/c from ST services. Suggested pt look into cognitive rehab in [place] if desired. SLP engaged pt in simple executive function task (meal planning/shopping list) with occasional min cues for organizaiton. SLP explained each attention level to  pt/fiance in order to highlight divided attention, as pt performed SELECTIVE attention tasks since last session. SLP and pt/fiance agreed that pt would work with divided attention at home and work on executive function in therapy. Homework to generate shopping list for March.   Assessment / Recommendations / Plan   Plan Continue with current plan of care   Progression Toward Goals   Progression toward goals Progressing toward goals          SLP Education - 11/29/14 1657    Education provided Yes   Education Details divided attention tasks, attention heirarchy   Person(s) Educated Patient;Caregiver(s)   Methods Explanation;Demonstration   Comprehension Verbalized understanding          SLP Short Term Goals - 11/29/14 1700    SLP SHORT TERM GOAL #3   Title Pt will perform simple executive function tasks with 80% accuracy with occassional minimal assistance   Time 1   Period Weeks   Status On-going          SLP Long Term Goals - 11/29/14 1701    SLP LONG TERM GOAL #1   Title Pt will alternate attention between 2 moderately complex tasks with 85% accuracy and rare minimal assistance   Status Achieved   SLP LONG TERM GOAL #2   Title Pt will manage medication, schedule, and daily chores  with external aids with rare minimal assistance   Status Achieved   SLP LONG TERM GOAL #3   Title Pt will perform executive function tasks with 85% accuracy and rare minimal asstance   Time 5   Period Weeks   Status On-going          Plan - 11/29/14 1658    Clinical Impression Statement Pt to complete divided attention tasks at home. SLP believes pt's fiance is becoming very good at cueing pt appropriately. Skilled ST needed to cont due to reduced executive function and divided attention skills (especially with higher level tasks). Reduce frequency to once/week due to executive function only, likely d/c in next 1-3 visits depending upon how homework goes.   Speech Therapy Frequency 1x  /week   Duration --  5 weeks   Treatment/Interventions Compensatory strategies;Cueing hierarchy;Functional tasks;Patient/family education;Cognitive reorganization;Environmental controls;SLP instruction and feedback;Internal/external aids   Potential to Achieve Goals Good   Potential Considerations Severity of impairments;Ability to learn/carryover information        Problem List Patient Active Problem List   Diagnosis Date Noted  . Cognitive deficit as late effect of traumatic brain injury 08/20/2014  . Headache 07/24/2014  . Fatigue 07/24/2014  . Generalized anxiety disorder 05/02/2014  . TBI (traumatic brain injury) 05/02/2014    [facility]CARL, SLP 11/29/2014, 5:03 PM  [facility] [address] [place], [place], [address] Phone: [phone]   Fax:  [phone]

## 2014-12-03 ENCOUNTER — Ambulatory Visit: Payer: 59

## 2014-12-04 ENCOUNTER — Encounter: Payer: 59 | Attending: Physical Medicine & Rehabilitation | Admitting: Physical Medicine & Rehabilitation

## 2014-12-04 ENCOUNTER — Encounter: Payer: Self-pay | Admitting: Physical Medicine & Rehabilitation

## 2014-12-04 VITALS — BP 122/70 | HR 80 | Resp 14

## 2014-12-04 DIAGNOSIS — R4189 Other symptoms and signs involving cognitive functions and awareness: Secondary | ICD-10-CM | POA: Diagnosis not present

## 2014-12-04 DIAGNOSIS — Z87820 Personal history of traumatic brain injury: Secondary | ICD-10-CM | POA: Diagnosis not present

## 2014-12-04 DIAGNOSIS — R42 Dizziness and giddiness: Secondary | ICD-10-CM | POA: Diagnosis not present

## 2014-12-04 DIAGNOSIS — S069X0S Unspecified intracranial injury without loss of consciousness, sequela: Secondary | ICD-10-CM

## 2014-12-04 DIAGNOSIS — S069X4S Unspecified intracranial injury with loss of consciousness of 6 hours to 24 hours, sequela: Secondary | ICD-10-CM

## 2014-12-04 DIAGNOSIS — R51 Headache: Secondary | ICD-10-CM | POA: Insufficient documentation

## 2014-12-04 DIAGNOSIS — F068 Other specified mental disorders due to known physiological condition: Secondary | ICD-10-CM

## 2014-12-04 DIAGNOSIS — R430 Anosmia: Secondary | ICD-10-CM | POA: Diagnosis not present

## 2014-12-04 DIAGNOSIS — F411 Generalized anxiety disorder: Secondary | ICD-10-CM

## 2014-12-04 DIAGNOSIS — S069X5S Unspecified intracranial injury with loss of consciousness greater than 24 hours with return to pre-existing conscious level, sequela: Secondary | ICD-10-CM

## 2014-12-04 MED ORDER — PROPRANOLOL HCL 20 MG PO TABS: 20.0000 mg | ORAL_TABLET | Freq: Three times a day (TID) | ORAL | Status: DC

## 2014-12-04 MED ORDER — METHYLPHENIDATE HCL 10 MG PO TABS: 15.0000 mg | ORAL_TABLET | Freq: Three times a day (TID) | ORAL | Status: DC

## 2014-12-04 MED ORDER — METHYLPHENIDATE HCL 10 MG PO TABS
15.0000 mg | ORAL_TABLET | Freq: Three times a day (TID) | ORAL | Status: DC
Start: 2014-12-04 — End: 2014-12-04

## 2014-12-04 MED ORDER — ALPRAZOLAM 0.5 MG PO TABS: 0.5000 mg | ORAL_TABLET | Freq: Two times a day (BID) | ORAL | Status: DC | PRN

## 2014-12-04 NOTE — Progress Notes (Signed)
Subjective:    Patient ID: Wanda Warner, female    DOB: 1980/12/06, 34 y.o.   MRN: [id number]  HPI   Wanda Warner is here in follow up. She has had more stress because of her husband's son moving back into the house. It sometimes increases her pain, headaches, and decreases her focus.   She is going to a family education which she is also a little nervous about. [place] Apparently there will be a pretty big turnout. She will drive there next weekend.  She is doing well so far with the propranolol---it seems to have "smoothed" her out a bit. . She hasn't really had problems with sedation or hypotension. Xanax helps her sleep and relax when she has breakthrough anixety.. Topamax continues at night for headache prophylaxis.   She continues on the ritalin for attention and focus. She finds it very helpful.   SinkRita is winding down with therapy. She has one more session tomorrow with SLP. They are moving toward a HEP. It sounds as if a referral was [place] [facility] [place] as [place].      Pain Inventory Average Pain 5 Pain Right Now 5 My pain is sharp, burning, stabbing and aching  In the last 24 hours, has pain interfered with the following? General activity 3 Relation with others 3 Enjoyment of life 4 What TIME of day is your pain at its worst? morning, daytime, evening Sleep (in general) Good  Pain is worse with: walking, bending and standing Pain improves with: heat/ice and medication Relief from Meds: 3  Mobility walk without assistance  Function disabled: date disabled .  Neuro/Psych anxiety loss of taste or smell  Prior Studies Any changes since last visit?  no  Physicians involved in your care Any changes since last visit?  no   Family History  Problem Relation Age of Onset  . Hypertension Father   . Diabetes Maternal Grandmother    History   Social History  . Marital Status: Single    Spouse Name: N/A    . Number of Children: N/A  . Years of Education: N/A   Social History Main Topics  . Smoking status: Current Every Day Smoker -- 0.05 packs/day for 10 years    Types: Cigarettes  . Smokeless tobacco: Not on file  . Alcohol Use: No  . Drug Use: No  . Sexual Activity: Yes    Birth Control/ Protection: None   Other Topics Concern  . None   Social History Narrative   [place] in [place]    Hobbies: play solitaire and reading         Past Surgical History  Procedure Laterality Date  . Orthopedic surgery    . Induced abortion      10/26/11  . Craniotomy    . Fracture surgery    . Cranioplasty     Past Medical History  Diagnosis Date  . Traumatic brain injury 07/28/13  . Headache   . Loss of smell   . Arthritis     Knees and back   . Thoracic compression fracture   . Anxiety    BP 122/70 mmHg  Pulse 80  Resp 14  SpO2 99%  Opioid Risk Score:   Fall Risk Score: Low Fall Risk (0-5 points)  Review of Systems  Constitutional:       Loss of smell  Psychiatric/Behavioral: The patient is nervous/anxious.   All other systems reviewed and  are negative.      Objective:   Physical Exam General: Alert and oriented x 3, No apparent distress. Wearing sun glasses  HEENT: Head is normocephalic, atraumatic, PERRLA, EOMI, sclera anicteric, oral mucosa pink and moist, dentition intact, ext ear canals clear,  Neck: Supple without JVD or lymphadenopathy  Heart: Reg rate and rhythm. No murmurs rubs or gallops  Chest: CTA bilaterally without wheezes, rales, or rhonchi; no distress  Abdomen: Soft, non-tender, non-distended, bowel sounds positive.  Extremities: No clubbing, cyanosis, or edema. Pulses are 2+  Skin: Clean and intact without signs of breakdown  Neuro: Strength nearly symmetrical. Sensation intact.Improved attention and focus. seems to be able to stay on topic more than in the past. She makes good eye contact with me. short term memory showing improvement. She  initiated much more today. She carried most of the conversation. Her husband had to interject minimally on her behalf. Musculoskeletal: Full ROM, No pain with AROM or PROM in the neck, trunk, or extremities. Posture appropriate.   Psych: Pt's affect is more animated. Pleasant and cooperative. Minimally anxious, non-agitated    Assessment & Plan:   1. Hx of TBI (07/28/13) with persistent cognitive and behavioral deficits. Also, she's having ongoing headaches, occasional vertigo, anosmia. Wanda Warner continues to demonstrate gradual improvement with attention/memory and behavior. The return of her stepson to the home has increased stress levels slightly. 2. Hx of T6 compression fracture   Plan:  1. Continue ritalin  bid to TID.  2. Continue topamax  --refilled today  3.Continue SLP at neuro rehab for cognitive remediation.  4. I made a referral at some to Dr. Eula Flax, [facility], for behavioral assessment and treat/coping skills, further education  5. Continue propranol  , increasing to TID.  6. Continue LOW dose xanax, 0.5mg  prn 12hrs for anxiety #60 6. Follow up with me in about a 2months.  30 minutes of face to face patient care time were spent during this visit. All questions were encouraged and answered.

## 2014-12-04 NOTE — Patient Instructions (Signed)
PLEASE CALL ME WITH ANY PROBLEMS OR QUESTIONS (#[phone]).      

## 2014-12-05 ENCOUNTER — Ambulatory Visit: Payer: 59

## 2014-12-05 DIAGNOSIS — R41841 Cognitive communication deficit: Secondary | ICD-10-CM

## 2014-12-05 NOTE — Therapy (Signed)
[facility] [address] [place], [place], [address] Phone: [phone]   Fax:  [phone]  Speech Language Pathology Treatment  Patient Details  Name: Wanda Warner MRN: [id number] Date of Birth: 02/08/1981 Referring Provider:  Rosemarie Ax, MD  Encounter Date: 12/05/2014      End of Session - 12/05/14 1538    Visit Number 10   Number of Visits 16   Date for SLP Re-Evaluation 12/12/14   SLP Start Time 18   SLP Stop Time  1445   SLP Time Calculation (min) 43 min   Activity Tolerance Patient tolerated treatment well      Past Medical History  Diagnosis Date  . Traumatic brain injury 07/28/13  . Headache   . Loss of smell   . Arthritis     Knees and back   . Thoracic compression fracture   . Anxiety     Past Surgical History  Procedure Laterality Date  . Orthopedic surgery    . Induced abortion      10/26/11  . Craniotomy    . Fracture surgery    . Cranioplasty      There were no vitals taken for this visit.  Visit Diagnosis: Cognitive communication deficit      Subjective Assessment - 12/05/14 1419    Symptoms Homework went very well - pt made shopping list for the month of March without fiance's help             ADULT SLP TREATMENT - 12/05/14 1420    General Information   Behavior/Cognition Alert;Cooperative;Pleasant mood   Treatment Provided   Treatment provided Cognitive-Linquistic   Pain Assessment   Pain Assessment 0-10   Pain Score 5    Pain Location mid-low back   Pain Descriptors / Indicators Aching   Pain Intervention(s) Monitored during session   Cognitive-Linquistic Treatment   Treatment focused on Cognition   Skilled Treatment Pt completed organizing your day task with mod distracting environment.  Pt req'd rare min A from SLP. SLP facilitated pt discussion re: her trip to [place] for packing and planning for weather. Pt independent with these plans (making lists,  checking weather, adding to lists as she remembers things to take).    Assessment / Recommendations / Plan   Plan All goals met   Progression Toward Goals   Progression toward goals Goals met, education completed, patient discharged from [facility] - 12/05/14 1633    SLP SHORT TERM GOAL #3   Title Pt will perform simple executive function tasks with 80% accuracy with occassional minimal assistance   Status Achieved          SLP Long Term Goals - 12/05/14 1633    SLP LONG TERM GOAL #1   Title Pt will alternate attention between 2 moderately complex tasks with 85% accuracy and rare minimal assistance   Status Achieved   SLP LONG TERM GOAL #2   Title Pt will manage medication, schedule, and daily chores with external aids with rare minimal assistance   Status Achieved   SLP LONG TERM GOAL #3   Title Pt will perform executive function tasks with 85% accuracy and rare minimal asstance   Status Achieved   SLP LONG TERM GOAL #4   Status Not Met          Plan - 12/05/14 1539    Clinical Impression Statement discharge at  this time. Pt independent with how to perform therapy tasks at home. Fiance to assist PRN.   Treatment/Interventions Compensatory strategies;Cueing hierarchy;Functional tasks;Patient/family education;Cognitive reorganization;Environmental controls;SLP instruction and feedback;Internal/external aids   Potential to Achieve Goals Good   Potential Considerations Severity of impairments     SPEECH THERAPY DISCHARGE SUMMARY  Visits from Start of Care: 11  Current functional level related to goals / functional outcomes: Pt met all STGs, and all LTGs except for divided attention, which pt cont using alternating attention. It is not recommended she engage in activities where she will need to divide her attention, especially not in situations where safety awareness is key. She made gains with selective and alternating attention as well as  executive function skills. In executive function tasks pt req'd consistent extra time to complete, with rare min A for at least 85% success. Pt has a planner/notebook that helps her organize and remember things, which she reports she uses. She now has a system to take her medications and has been successful with this for a few weeks now.   Remaining deficits: Executive function, divided attention   Education / Equipment: Need to compensate with a planner/notebook.  Plan: Patient agrees to discharge.  Patient goals were not met. Patient is being discharged due to meeting the stated rehab goals.  ?and now being able to complete tasks at home. Referral to [facility] for Cognitive Rehab is recommended.         Problem List Patient Active Problem List   Diagnosis Date Noted  . Cognitive deficit as late effect of traumatic brain injury 08/20/2014  . Headache 07/24/2014  . Fatigue 07/24/2014  . Generalized anxiety disorder 05/02/2014  . TBI (traumatic brain injury) 05/02/2014    [facility], SLP 12/05/2014, 4:35 PM  [facility] [address] [place], [place], [address] Phone: [phone]   Fax:  [phone]

## 2014-12-05 NOTE — Patient Instructions (Signed)
Continue with divided attention tasks at home Continue with functional executive function tasks at home whenever possible

## 2014-12-06 ENCOUNTER — Ambulatory Visit: Payer: 59 | Admitting: Physical Medicine & Rehabilitation

## 2015-01-24 ENCOUNTER — Ambulatory Visit: Payer: 59 | Attending: Physical Medicine & Rehabilitation | Admitting: Psychology

## 2015-01-24 DIAGNOSIS — S069X0S Unspecified intracranial injury without loss of consciousness, sequela: Secondary | ICD-10-CM | POA: Diagnosis not present

## 2015-01-24 DIAGNOSIS — F068 Other specified mental disorders due to known physiological condition: Secondary | ICD-10-CM | POA: Diagnosis not present

## 2015-01-24 DIAGNOSIS — R41841 Cognitive communication deficit: Secondary | ICD-10-CM | POA: Insufficient documentation

## 2015-01-24 DIAGNOSIS — F4001 Agoraphobia with panic disorder: Secondary | ICD-10-CM | POA: Diagnosis not present

## 2015-01-28 ENCOUNTER — Encounter: Payer: Self-pay | Admitting: Psychology
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
## 2015-01-31 ENCOUNTER — Encounter: Payer: Self-pay | Admitting: Psychology

## 2015-01-31 ENCOUNTER — Ambulatory Visit (INDEPENDENT_AMBULATORY_CARE_PROVIDER_SITE_OTHER): Payer: 59 | Admitting: Psychology

## 2015-01-31 DIAGNOSIS — S069X0S Unspecified intracranial injury without loss of consciousness, sequela: Secondary | ICD-10-CM

## 2015-01-31 DIAGNOSIS — F068 Other specified mental disorders due to known physiological condition: Secondary | ICD-10-CM | POA: Diagnosis not present

## 2015-01-31 DIAGNOSIS — F4001 Agoraphobia with panic disorder: Secondary | ICD-10-CM

## 2015-01-31 NOTE — Progress Notes (Signed)
[facility]  [address]   Telephone [phone] [address] Fax [phone]) [phone] [place], [place] [address]   Psychology Progress Note   Name:  De BlanchRita Fooks Date of Birth:  Apr 26, 1981 MRN:  [id number]  Date:01/31/2015 (3733m) psychotherapy Cat returns for her second appointment. She reported feeling "stressed" by her fiancee's son not performing household chores, which feels disrespectful to her. She also discussed her frustration that most others, notably her parents, seem to lack an appreciation of how her brain injury has effected her cognitive functioning and personality. She reported that she was able to attend and enjoy a family reunion this past week with the aid of Xanax. She agrees that use of Xanax alone is not the solution to her anxiety problem.   Reviewed goals of treatment. Introduced relaxation breathing today. Gave her exercises to help her to distinguish diagphrammatic breathing from chest breathing. Also introduced the importance of her finding more precise words to identify her emotional state beyond "stress".   Diagnostic Impressions Agoraphobia with panic attacks [F40.01] Cognitive Deficits as late effect of TBI [F06.8]  Return in one week.  Gladstone PihMichael F. Sahej Schrieber, Ph.D Licensed Psychologist

## 2015-02-03 ENCOUNTER — Other Ambulatory Visit: Payer: Self-pay | Admitting: Physical Medicine & Rehabilitation

## 2015-02-03 ENCOUNTER — Encounter: Payer: Self-pay | Admitting: Physical Medicine & Rehabilitation

## 2015-02-03 ENCOUNTER — Encounter: Payer: 59 | Attending: Physical Medicine & Rehabilitation | Admitting: Physical Medicine & Rehabilitation

## 2015-02-03 VITALS — BP 113/77 | HR 69 | Resp 14

## 2015-02-03 DIAGNOSIS — Z79899 Other long term (current) drug therapy: Secondary | ICD-10-CM

## 2015-02-03 DIAGNOSIS — F411 Generalized anxiety disorder: Secondary | ICD-10-CM

## 2015-02-03 DIAGNOSIS — G894 Chronic pain syndrome: Secondary | ICD-10-CM | POA: Diagnosis not present

## 2015-02-03 DIAGNOSIS — Z87820 Personal history of traumatic brain injury: Secondary | ICD-10-CM | POA: Diagnosis present

## 2015-02-03 DIAGNOSIS — S069X0S Unspecified intracranial injury without loss of consciousness, sequela: Secondary | ICD-10-CM

## 2015-02-03 DIAGNOSIS — R4189 Other symptoms and signs involving cognitive functions and awareness: Secondary | ICD-10-CM | POA: Diagnosis not present

## 2015-02-03 DIAGNOSIS — R51 Headache: Secondary | ICD-10-CM | POA: Insufficient documentation

## 2015-02-03 DIAGNOSIS — F068 Other specified mental disorders due to known physiological condition: Secondary | ICD-10-CM

## 2015-02-03 DIAGNOSIS — Z5181 Encounter for therapeutic drug level monitoring: Secondary | ICD-10-CM

## 2015-02-03 DIAGNOSIS — S069X5S Unspecified intracranial injury with loss of consciousness greater than 24 hours with return to pre-existing conscious level, sequela: Secondary | ICD-10-CM

## 2015-02-03 DIAGNOSIS — R430 Anosmia: Secondary | ICD-10-CM | POA: Diagnosis not present

## 2015-02-03 DIAGNOSIS — R42 Dizziness and giddiness: Secondary | ICD-10-CM | POA: Diagnosis not present

## 2015-02-03 DIAGNOSIS — G44329 Chronic post-traumatic headache, not intractable: Secondary | ICD-10-CM

## 2015-02-03 MED ORDER — ALPRAZOLAM 0.5 MG PO TABS: 0.5000 mg | ORAL_TABLET | Freq: Two times a day (BID) | ORAL | Status: DC | PRN

## 2015-02-03 MED ORDER — METHYLPHENIDATE HCL 10 MG PO TABS: 15.0000 mg | ORAL_TABLET | Freq: Three times a day (TID) | ORAL | Status: DC

## 2015-02-03 NOTE — Patient Instructions (Signed)
PLEASE CALL ME WITH ANY PROBLEMS OR QUESTIONS (#[phone]).      
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
## 2015-02-04 LAB — PMP ALCOHOL METABOLITE (ETG): Ethyl Glucuronide (EtG): NEGATIVE ng/mL

## 2015-02-06 LAB — METHYLPHENIDATE METAB, QUANT, U: Ritalinic Acid: NEGATIVE ng/mL (ref <100)

## 2015-02-07 ENCOUNTER — Encounter: Payer: Self-pay | Admitting: Psychology

## 2015-02-07 ENCOUNTER — Ambulatory Visit (INDEPENDENT_AMBULATORY_CARE_PROVIDER_SITE_OTHER): Payer: 59 | Admitting: Psychology

## 2015-02-07 DIAGNOSIS — F4001 Agoraphobia with panic disorder: Secondary | ICD-10-CM

## 2015-02-07 DIAGNOSIS — S069X0S Unspecified intracranial injury without loss of consciousness, sequela: Secondary | ICD-10-CM | POA: Diagnosis not present

## 2015-02-07 DIAGNOSIS — F068 Other specified mental disorders due to known physiological condition: Secondary | ICD-10-CM

## 2015-02-07 LAB — BUPRENORPHINES (GC/LC/MS), URINE
BUPRENORPHINE (GC/LC/MS): 5.3 ng/mL — AB (ref <2)
NORBUPRENORPHINE: 76.7 ng/mL — AB (ref <2)

## 2015-02-07 LAB — PRESCRIPTION MONITORING PROFILE (SOLSTAS)
Amphetamine/Meth: NEGATIVE ng/mL
BENZODIAZEPINE SCREEN, URINE: NEGATIVE ng/mL
Barbiturate Screen, Urine: NEGATIVE ng/mL
COCAINE METABOLITES: NEGATIVE ng/mL
CREATININE, URINE: 109 mg/dL (ref >20.0)
Cannabinoid Scrn, Ur: NEGATIVE ng/mL
Carisoprodol, Urine: NEGATIVE ng/mL
Fentanyl, Ur: NEGATIVE ng/mL
MDMA URINE: NEGATIVE ng/mL
Meperidine, Ur: NEGATIVE ng/mL
Methadone Screen, Urine: NEGATIVE ng/mL
NITRITES URINE, INITIAL: NEGATIVE ug/mL
OPIATE SCREEN, URINE: NEGATIVE ng/mL
Oxycodone Screen, Ur: NEGATIVE ng/mL
Propoxyphene: NEGATIVE ng/mL
Tapentadol, urine: NEGATIVE ng/mL
Tramadol Scrn, Ur: NEGATIVE ng/mL
ZOLPIDEM, URINE: NEGATIVE ng/mL
pH, Initial: 5.3 pH (ref 4.5–8.9)
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
## 2015-02-14 ENCOUNTER — Ambulatory Visit: Payer: 59 | Attending: Physical Medicine & Rehabilitation | Admitting: Psychology

## 2015-02-14 DIAGNOSIS — F068 Other specified mental disorders due to known physiological condition: Secondary | ICD-10-CM

## 2015-02-14 DIAGNOSIS — F4001 Agoraphobia with panic disorder: Secondary | ICD-10-CM | POA: Diagnosis not present

## 2015-02-14 DIAGNOSIS — S069X0S Unspecified intracranial injury without loss of consciousness, sequela: Secondary | ICD-10-CM

## 2015-02-14 DIAGNOSIS — R41841 Cognitive communication deficit: Secondary | ICD-10-CM | POA: Insufficient documentation

## 2015-02-14 NOTE — Progress Notes (Signed)
**Note Wanda-Identified via Obfuscation** [facility]  [address]   Telephone [phone] [address] Fax [phone](336) [phone] [place], [place] [address]   Psychology Progress Note   Name:  Wanda BlanchRita Warner Date of Birth:  [birth year] MRN:  [id number]  Date:02/14/2015 (4060m) psychotherapy She reported that she walked outside with her fiancee on three occasions within the past week, each time causing her a mild degree of anxiety. She reported that she will not walk alone due to her fear of falling and sustaining further brain injury or death. She reported having trouble learning relaxation breathing. Invited her fiancee, Mr. Jeri LagerShane Barker, to join session. He agreed that anxiety is her most significant issue. He reported that he tries to be nearby while she walks or takes a shower due to his worry that she might fall. They both agreed that her balance has been good.  Diagnostic Impressions Agoraphobia with Panic Attacks [F40.01] Cognitive Deficits as late effect of TBI [F06.8]  Initially decided that her next challenge would be to sit thru a movie in a crowded theater though her financee stated that they cannot afford to go to the movies at this time. So we switched the goal to her walking in a crowded shopping mall for at least an hour while using relaxation breathing. She might benefit from a commerically-produced relaxation tape for practice.     Suggested that we change the schedule to every two weeks. Ms. Melchor AmourHerrin agreed.    Gladstone PihMichael F. Shenouda Genova, Ph.D Licensed Psychologist

## 2015-02-26 NOTE — Progress Notes (Signed)
Urine drug screen for this encounter is inconsistent.  She reported last ritalin was taken 01/31/15 and test was done 02/03/15 so this would be consistent for being absent in urine.  What is unexplained is she has buprenorphine being present.There is not record of it being prescribed on the [facility].  There is an entry of suboxone being put on her med list at one time (717/15) but never showing being prescribed, so this finding is not explained.

## 2015-03-03 ENCOUNTER — Ambulatory Visit (INDEPENDENT_AMBULATORY_CARE_PROVIDER_SITE_OTHER): Payer: 59 | Admitting: Psychology

## 2015-03-03 DIAGNOSIS — F068 Other specified mental disorders due to known physiological condition: Secondary | ICD-10-CM

## 2015-03-03 DIAGNOSIS — S069X0S Unspecified intracranial injury without loss of consciousness, sequela: Secondary | ICD-10-CM

## 2015-03-03 DIAGNOSIS — F4001 Agoraphobia with panic disorder: Secondary | ICD-10-CM

## 2015-03-03 NOTE — Psych (Signed)
[facility]  [address] Telephone [phone] [address]Fax [phone] [place], [place] [address]   Psychology Progress Note   Name:Wanda Warner Date of Birth: 07/08/1981 MRN:[id number]  Date:03/03/2015 (45m) psychotherapy She reported that she has been walking more often and has several times shopped at a grocery store without much difficulty. She did not walk with her fiancee in a crowded shopping mall as we had discussed because she stated they have been busy trying to purchase a house. No new complaints voiced. Mood described as good.  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  Return in two weeks.   Kennedi Lizardo F. Alveria Mcglaughlin, Ph.D Licensed Psychologist 

## 2015-03-18 ENCOUNTER — Encounter: Payer: Self-pay | Admitting: Psychology

## 2015-03-18 ENCOUNTER — Telehealth: Payer: Self-pay | Admitting: *Deleted

## 2015-03-18 ENCOUNTER — Ambulatory Visit: Payer: 59 | Attending: Physical Medicine & Rehabilitation | Admitting: Psychology

## 2015-03-18 DIAGNOSIS — F4001 Agoraphobia with panic disorder: Secondary | ICD-10-CM | POA: Diagnosis not present

## 2015-03-18 DIAGNOSIS — F068 Other specified mental disorders due to known physiological condition: Secondary | ICD-10-CM | POA: Diagnosis not present

## 2015-03-18 DIAGNOSIS — S069X0S Unspecified intracranial injury without loss of consciousness, sequela: Secondary | ICD-10-CM | POA: Diagnosis not present

## 2015-03-18 NOTE — Telephone Encounter (Signed)
Mr. Dewaine CongerBarker called on behalf of pt.  They have an upcoming review with the disability people.  He is asking for proof of all their previous visits and future visits. I printed visit summaries for each of the previous patient's visits and printed an itinerary that includes historical visits and future visits.  I called Mr. Dewaine CongerBarker back and informed that paperwork was ready. He said he would come by tomorrow to pick them up

## 2015-03-18 NOTE — Progress Notes (Signed)
[facility]  [address] Telephone [phone] [address]Fax [phone](336) [phone] [place], [place] [address]   Psychology Progress Note   Name:Wanda Warner Date of Birth: 1981-06-11 ZHY:[id number]  Date:03/03/2015 (8038m) psychotherapy She reported that she has been walking more often and has several times shopped at a grocery store without much difficulty. She did not walk with her fiancee in a crowded shopping mall as we had discussed because she stated they have been busy trying to purchase a house. No new complaints voiced. Mood described as good.  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  Return in two weeks.   Gladstone PihMichael F. Carizma Dunsworth, Ph.D Licensed Psychologist

## 2015-03-18 NOTE — Progress Notes (Signed)
**Note Wanda-Identified via Obfuscation** [facility]  [address]   Telephone [phone] [address] Fax [phone]) [phone] [place], [place] [address]   Psychology Progress Note   Name:  Wanda BlanchRita Warner Date of Birth:  [birth year] MRN:  [id number]  Date:03/18/2015 (164m) psychotherapy Georgiann HahnKat continues to increase her activity level. She was able to go to the bank though this was somewhat easy as there was no line.She reported feeling anxious about an upcoming funeral and a disability review meeting. After she gets thru those events, the goal will be to be able to stay in line to purchase an item at a store. No other complaints or worries expressed.  Diagnostic Impressions Agoraphobiua [F40.01] Cognitive Deficits as a late effect of TBI [F06.8]  Return in two weeks.  Gladstone PihMichael F. Safwan Tomei, Ph.D Licensed Psychologist

## 2015-04-01 ENCOUNTER — Ambulatory Visit (INDEPENDENT_AMBULATORY_CARE_PROVIDER_SITE_OTHER): Payer: 59 | Admitting: Psychology

## 2015-04-01 DIAGNOSIS — F068 Other specified mental disorders due to known physiological condition: Secondary | ICD-10-CM | POA: Diagnosis not present

## 2015-04-01 DIAGNOSIS — S069X0S Unspecified intracranial injury without loss of consciousness, sequela: Secondary | ICD-10-CM

## 2015-04-01 DIAGNOSIS — F4001 Agoraphobia with panic disorder: Secondary | ICD-10-CM | POA: Diagnosis not present

## 2015-04-01 NOTE — Progress Notes (Signed)
[facility]  [address]   Telephone [phone] [address] Fax [phone] [place], [place] [address]   Psychology Progress Note   Name:  Wanda Warner Date of Birth:  [birth year] MRN:  [id number]  Date:04/01/2015 (56m) psychotherapy [name] was able to attend a funeral with minimal anxiety. In contrast, she became nervous at her appointment for disability review. Mood continues to be positive. No new problems or concerns reported.  Diagnostic Impressions Agoraphobia [F40.01] Cognitive Deficits as a late effect of TBI [F06.8]  Encouraged her to seek out and use positive daily affirmations to promote calmness.  Return in three weeks.   Gladstone Pih, Ph.D Licensed Psychologist

## 2015-04-07 ENCOUNTER — Encounter: Payer: 59 | Attending: Physical Medicine & Rehabilitation | Admitting: Physical Medicine & Rehabilitation

## 2015-04-07 ENCOUNTER — Encounter: Payer: Self-pay | Admitting: Physical Medicine & Rehabilitation

## 2015-04-07 VITALS — BP 110/48 | HR 64 | Resp 16

## 2015-04-07 DIAGNOSIS — Z87820 Personal history of traumatic brain injury: Secondary | ICD-10-CM | POA: Insufficient documentation

## 2015-04-07 DIAGNOSIS — F411 Generalized anxiety disorder: Secondary | ICD-10-CM

## 2015-04-07 DIAGNOSIS — S069X0D Unspecified intracranial injury without loss of consciousness, subsequent encounter: Secondary | ICD-10-CM

## 2015-04-07 DIAGNOSIS — S069X0S Unspecified intracranial injury without loss of consciousness, sequela: Secondary | ICD-10-CM

## 2015-04-07 DIAGNOSIS — R430 Anosmia: Secondary | ICD-10-CM | POA: Insufficient documentation

## 2015-04-07 DIAGNOSIS — R41840 Attention and concentration deficit: Secondary | ICD-10-CM

## 2015-04-07 DIAGNOSIS — Z5181 Encounter for therapeutic drug level monitoring: Secondary | ICD-10-CM

## 2015-04-07 DIAGNOSIS — R51 Headache: Secondary | ICD-10-CM | POA: Insufficient documentation

## 2015-04-07 DIAGNOSIS — S069X5S Unspecified intracranial injury with loss of consciousness greater than 24 hours with return to pre-existing conscious level, sequela: Secondary | ICD-10-CM

## 2015-04-07 DIAGNOSIS — R4189 Other symptoms and signs involving cognitive functions and awareness: Secondary | ICD-10-CM | POA: Diagnosis not present

## 2015-04-07 DIAGNOSIS — R42 Dizziness and giddiness: Secondary | ICD-10-CM | POA: Diagnosis not present

## 2015-04-07 DIAGNOSIS — F068 Other specified mental disorders due to known physiological condition: Secondary | ICD-10-CM

## 2015-04-07 DIAGNOSIS — Z79899 Other long term (current) drug therapy: Secondary | ICD-10-CM

## 2015-04-07 MED ORDER — METHYLPHENIDATE HCL 10 MG PO TABS: 15.0000 mg | ORAL_TABLET | Freq: Three times a day (TID) | ORAL | Status: DC

## 2015-04-07 MED ORDER — TOPIRAMATE 50 MG PO TABS: 50.0000 mg | ORAL_TABLET | Freq: Every day | ORAL | Status: DC

## 2015-04-07 NOTE — Progress Notes (Signed)
**Note Wanda-Identified via Obfuscation** Subjective:    Patient ID: Wanda Warner, female    DOB: Apr 16, 1981, 34 y.o.   MRN: [id number]  HPI   Wanda Warner is here in follow up of her TBI. She is stressed about the disability process. She asked me if they had asked me for any paperwork. She has already started some of the fact finding, initial process.   Wanda Warner struggles with the fact that she has not returned to baseline. She remains hopeful that some change still might happen.   She continues with Dr. Leonides CaveZelson for counseling. Outpt SLP has finished.    Last UDS was negative for her Ritalin though by [facility] it was filled 01/02/15,02/03/15, 03/04/15.  Did not bring today.  UDS was also positive for Suboxone which did not appear on [facility]. She and husband explained the situation to me today.   Pain Inventory Average Pain 6 Pain Right Now 5 My pain is sharp, burning, dull, stabbing and aching  In the last 24 hours, has pain interfered with the following? General activity 7 Relation with others 6 Enjoyment of life 6 What TIME of day is your pain at its worst? evening Sleep (in general) NA  Pain is worse with: bending, standing and some activites Pain improves with: heat/ice Relief from Meds: no pain med  Mobility walk without assistance  Function disabled: date disabled . I need assistance with the following:  meal prep, household duties and shopping  Neuro/Psych anxiety  Prior Studies Any changes since last visit?  no  Physicians involved in your care Any changes since last visit?  no   Family History  Problem Relation Age of Onset  . Hypertension Father   . Diabetes Maternal Grandmother    History   Social History  . Marital Status: Single    Spouse Name: N/A  . Number of Children: N/A  . Years of Education: N/A   Social History Main Topics  . Smoking status: Current Every Day Smoker -- 0.05 packs/day for 10 years    Types: Cigarettes  . Smokeless tobacco: Not on file  . Alcohol Use: No  . Drug Use: No  .  Sexual Activity: Yes    Birth Control/ Protection: None   Other Topics Concern  . None   Social History Narrative   [place]    Hobbies: play solitaire and reading         Past Surgical History  Procedure Laterality Date  . Orthopedic surgery    . Induced abortion      10/26/11  . Craniotomy    . Fracture surgery    . Cranioplasty     Past Medical History  Diagnosis Date  . Traumatic brain injury 07/28/13  . Headache   . Loss of smell   . Arthritis     Knees and back   . Thoracic compression fracture   . Anxiety    BP 110/48 mmHg  Pulse 64  Resp 16  SpO2 99%  Opioid Risk Score:   Fall Risk Score: Low Fall Risk (0-5 points)`1  Depression screen PHQ 2/9  Depression screen PHQ 2/9 04/07/2015  Decreased Interest 2  Down, Depressed, Hopeless 1  PHQ - 2 Score 3  Altered sleeping 1  Tired, decreased energy 3  Change in appetite 3  Feeling bad or failure about yourself  3  Trouble concentrating 3  Moving slowly or fidgety/restless 1  Suicidal thoughts 0  PHQ-9 Score 17     Review of Systems  Constitutional:  Loss of smell  Psychiatric/Behavioral: The patient is nervous/anxious.   All other systems reviewed and are negative.      Objective:   Physical Exam  General: Alert and oriented x 3, No apparent distress. Wearing sun glasses again.  HEENT: Head is normocephalic, atraumatic, PERRLA, EOMI, sclera anicteric, oral mucosa pink and moist, dentition intact, ext ear canals clear.  Neck: Supple without JVD or lymphadenopathy  Heart: Reg rate and rhythm. No murmurs rubs or gallops  Chest: CTA bilaterally without wheezes, rales, or rhonchi; no distress  Abdomen: Soft, non-tender, non-distended, bowel sounds positive.  Extremities: No clubbing, cyanosis, or edema. Pulses are 2+  Skin: Clean and intact without signs of breakdown  Neuro: Strength nearly symmetrical. Sensation intact.Improved attention and focus. seems to be able to stay on  topic more than in the past. She makes good eye contact with me. short term memory shows continued improvement. Recent memory better. She carried most of the conversation and initiates much more. Her husband had to interject minimally on her behalf.  Musculoskeletal: Full ROM, No pain with AROM or PROM in the neck, trunk, or extremities. Posture appropriate.  Psych: Pt's affect is more animated. Pleasant and cooperative. Minimally anxious, non-agitated   Assessment & Plan:   1. Hx of TBI (07/28/13) with persistent cognitive and behavioral deficits. Also, she's having ongoing headaches, occasional vertigo, anosmia. Wanda Warner continues to demonstrate gradual improvement with attention/memory and behavior.  2. Hx of T6 compression fracture  3. Agoraphobia per psychology 4. Chronic low back pain.      Plan:  1. Continue ritalin  bid to TID. We reviewed her recent drug screens. She admitted to taking old suboxone she had from a prior doctor.  The med has been removed from the house. She had run out of her ritalin 2-3 days before the last UDS which would explain the findings. 2. Continue topamax  --refilled today  3. Continue SLP at neuro rehab for cognitive remediation.  4.Continue counseling per Dr. Leonides Cave which had remained helpful.  5. Continue propranol  TID.  6.  We will dose xanax, 0.5mg  prn 12hrs for anxiety #60.  Continue to work on Pharmacologist per psychology. We discussed coping and adjustment at length.  7. Follow up with me in about a 2 months. 30 minutes of face to face patient care time were spent during this visit. All questions were encouraged and answered.

## 2015-04-07 NOTE — Patient Instructions (Addendum)
YOU CAN PICK UP [facility] IN 2 MONTHS FOR YOUR LAST PRESCRIPTION---I'LL SEE YOU IN 3 MONTHS.    Low Back Strain with Rehab A strain is an injury in which a tendon or muscle is torn. The muscles and tendons of the lower back are vulnerable to strains. However, these muscles and tendons are very strong and require a great force to be injured. Strains are classified into three categories. Grade 1 strains cause pain, but the tendon is not lengthened. Grade 2 strains include a lengthened ligament, due to the ligament being stretched or partially ruptured. With grade 2 strains there is still function, although the function may be decreased. Grade 3 strains involve a complete tear of the tendon or muscle, and function is usually impaired. SYMPTOMS   Pain in the lower back.  Pain that affects one side more than the other.  Pain that gets worse with movement and may be felt in the hip, buttocks, or back of the thigh.  Muscle spasms of the muscles in the back.  Swelling along the muscles of the back.  Loss of strength of the back muscles.  Crackling sound (crepitation) when the muscles are touched. CAUSES  Lower back strains occur when a force is placed on the muscles or tendons that is greater than they can handle. Common causes of injury include:  Prolonged overuse of the muscle-tendon units in the lower back, usually from incorrect posture.  A single violent injury or force applied to the back. RISK INCREASES WITH:  Sports that involve twisting forces on the spine or a lot of bending at the waist (football, rugby, weightlifting, bowling, golf, tennis, speed skating, racquetball, swimming, running, gymnastics, diving).  Poor strength and flexibility.  Failure to warm up properly before activity.  Family history of lower back pain or disk disorders.  Previous back injury or surgery (especially fusion).  Poor posture with lifting, especially heavy objects.  Prolonged sitting, especially  with poor posture. PREVENTION   Learn and use proper posture when sitting or lifting (maintain proper posture when sitting, lift using the knees and legs, not at the waist).  Warm up and stretch properly before activity.  Allow for adequate recovery between workouts.  Maintain physical fitness:  Strength, flexibility, and endurance.  Cardiovascular fitness. PROGNOSIS  If treated properly, lower back strains usually heal within 6 weeks. RELATED COMPLICATIONS   Recurring symptoms, resulting in a chronic problem.  Chronic inflammation, scarring, and partial muscle-tendon tear.  Delayed healing or resolution of symptoms.  Prolonged disability. TREATMENT  Treatment first involves the use of ice and medicine, to reduce pain and inflammation. The use of strengthening and stretching exercises may help reduce pain with activity. These exercises may be performed at home or with a therapist. Severe injuries may require referral to a therapist for further evaluation and treatment, such as ultrasound. Your caregiver may advise that you wear a back brace or corset, to help reduce pain and discomfort. Often, prolonged bed rest results in greater harm then benefit. Corticosteroid injections may be recommended. However, these should be reserved for the most serious cases. It is important to avoid using your back when lifting objects. At night, sleep on your back on a firm mattress with a pillow placed under your knees. If non-surgical treatment is unsuccessful, surgery may be needed.  MEDICATION   If pain medicine is needed, nonsteroidal anti-inflammatory medicines (aspirin and ibuprofen), or other minor pain relievers (acetaminophen), are often advised.  Do not take pain medicine for 7  days before surgery.  Prescription pain relievers may be given, if your caregiver thinks they are needed. Use only as directed and only as much as you need.  Ointments applied to the skin may be  helpful.  Corticosteroid injections may be given by your caregiver. These injections should be reserved for the most serious cases, because they may only be given a certain number of times. HEAT AND COLD  Cold treatment (icing) should be applied for 10 to 15 minutes every 2 to 3 hours for inflammation and pain, and immediately after activity that aggravates your symptoms. Use ice packs or an ice massage.  Heat treatment may be used before performing stretching and strengthening activities prescribed by your caregiver, physical therapist, or athletic trainer. Use a heat pack or a warm water soak. SEEK MEDICAL CARE IF:   Symptoms get worse or do not improve in 2 to 4 weeks, despite treatment.  You develop numbness, weakness, or loss of bowel or bladder function.  New, unexplained symptoms develop. (Drugs used in treatment may produce side effects.) EXERCISES  RANGE OF MOTION (ROM) AND STRETCHING EXERCISES - Low Back Strain Most people with lower back pain will find that their symptoms get worse with excessive bending forward (flexion) or arching at the lower back (extension). The exercises which will help resolve your symptoms will focus on the opposite motion.  Your physician, physical therapist or athletic trainer will help you determine which exercises will be most helpful to resolve your lower back pain. Do not complete any exercises without first consulting with your caregiver. Discontinue any exercises which make your symptoms worse until you speak to your caregiver.  If you have pain, numbness or tingling which travels down into your buttocks, leg or foot, the goal of the therapy is for these symptoms to move closer to your back and eventually resolve. Sometimes, these leg symptoms will get better, but your lower back pain may worsen. This is typically an indication of progress in your rehabilitation. Be very alert to any changes in your symptoms and the activities in which you participated  in the 24 hours prior to the change. Sharing this information with your caregiver will allow him/her to most efficiently treat your condition.  These exercises may help you when beginning to rehabilitate your injury. Your symptoms may resolve with or without further involvement from your physician, physical therapist or athletic trainer. While completing these exercises, remember:  Restoring tissue flexibility helps normal motion to return to the joints. This allows healthier, less painful movement and activity.  An effective stretch should be held for at least 30 seconds.  A stretch should never be painful. You should only feel a gentle lengthening or release in the stretched tissue. FLEXION RANGE OF MOTION AND STRETCHING EXERCISES: STRETCH - Flexion, Single Knee to Chest   Lie on a firm bed or floor with both legs extended in front of you.  Keeping one leg in contact with the floor, bring your opposite knee to your chest. Hold your leg in place by either grabbing behind your thigh or at your knee.  Pull until you feel a gentle stretch in your lower back. Hold __________ seconds.  Slowly release your grasp and repeat the exercise with the opposite side. Repeat __________ times. Complete this exercise __________ times per day.  STRETCH - Flexion, Double Knee to Chest   Lie on a firm bed or floor with both legs extended in front of you.  Keeping one leg in contact with  the floor, bring your opposite knee to your chest.  Tense your stomach muscles to support your back and then lift your other knee to your chest. Hold your legs in place by either grabbing behind your thighs or at your knees.  Pull both knees toward your chest until you feel a gentle stretch in your lower back. Hold __________ seconds.  Tense your stomach muscles and slowly return one leg at a time to the floor. Repeat __________ times. Complete this exercise __________ times per day.  STRETCH - Low Trunk Rotation  Lie  on a firm bed or floor. Keeping your legs in front of you, bend your knees so they are both pointed toward the ceiling and your feet are flat on the floor.  Extend your arms out to the side. This will stabilize your upper body by keeping your shoulders in contact with the floor.  Gently and slowly drop both knees together to one side until you feel a gentle stretch in your lower back. Hold for __________ seconds.  Tense your stomach muscles to support your lower back as you bring your knees back to the starting position. Repeat the exercise to the other side. Repeat __________ times. Complete this exercise __________ times per day  EXTENSION RANGE OF MOTION AND FLEXIBILITY EXERCISES: STRETCH - Extension, Prone on Elbows   Lie on your stomach on the floor, a bed will be too soft. Place your palms about shoulder width apart and at the height of your head.  Place your elbows under your shoulders. If this is too painful, stack pillows under your chest.  Allow your body to relax so that your hips drop lower and make contact more completely with the floor.  Hold this position for __________ seconds.  Slowly return to lying flat on the floor. Repeat __________ times. Complete this exercise __________ times per day.  RANGE OF MOTION - Extension, Prone Press Ups  Lie on your stomach on the floor, a bed will be too soft. Place your palms about shoulder width apart and at the height of your head.  Keeping your back as relaxed as possible, slowly straighten your elbows while keeping your hips on the floor. You may adjust the placement of your hands to maximize your comfort. As you gain motion, your hands will come more underneath your shoulders.  Hold this position __________ seconds.  Slowly return to lying flat on the floor. Repeat __________ times. Complete this exercise __________ times per day.  RANGE OF MOTION- Quadruped, Neutral Spine   Assume a hands and knees position on a firm surface.  Keep your hands under your shoulders and your knees under your hips. You may place padding under your knees for comfort.  Drop your head and point your tail bone toward the ground below you. This will round out your lower back like an angry cat. Hold this position for __________ seconds.  Slowly lift your head and release your tail bone so that your back sags into a large arch, like an old horse.  Hold this position for __________ seconds.  Repeat this until you feel limber in your lower back.  Now, find your "sweet spot." This will be the most comfortable position somewhere between the two previous positions. This is your neutral spine. Once you have found this position, tense your stomach muscles to support your lower back.  Hold this position for __________ seconds. Repeat __________ times. Complete this exercise __________ times per day.  STRENGTHENING EXERCISES - Low Back  Strain These exercises may help you when beginning to rehabilitate your injury. These exercises should be done near your "sweet spot." This is the neutral, low-back arch, somewhere between fully rounded and fully arched, that is your least painful position. When performed in this safe range of motion, these exercises can be used for people who have either a flexion or extension based injury. These exercises may resolve your symptoms with or without further involvement from your physician, physical therapist or athletic trainer. While completing these exercises, remember:   Muscles can gain both the endurance and the strength needed for everyday activities through controlled exercises.  Complete these exercises as instructed by your physician, physical therapist or athletic trainer. Increase the resistance and repetitions only as guided.  You may experience muscle soreness or fatigue, but the pain or discomfort you are trying to eliminate should never worsen during these exercises. If this pain does worsen, stop and make  certain you are following the directions exactly. If the pain is still present after adjustments, discontinue the exercise until you can discuss the trouble with your caregiver. STRENGTHENING - Deep Abdominals, Pelvic Tilt  Lie on a firm bed or floor. Keeping your legs in front of you, bend your knees so they are both pointed toward the ceiling and your feet are flat on the floor.  Tense your lower abdominal muscles to press your lower back into the floor. This motion will rotate your pelvis so that your tail bone is scooping upwards rather than pointing at your feet or into the floor.  With a gentle tension and even breathing, hold this position for __________ seconds. Repeat __________ times. Complete this exercise __________ times per day.  STRENGTHENING - Abdominals, Crunches   Lie on a firm bed or floor. Keeping your legs in front of you, bend your knees so they are both pointed toward the ceiling and your feet are flat on the floor. Cross your arms over your chest.  Slightly tip your chin down without bending your neck.  Tense your abdominals and slowly lift your trunk high enough to just clear your shoulder blades. Lifting higher can put excessive stress on the lower back and does not further strengthen your abdominal muscles.  Control your return to the starting position. Repeat __________ times. Complete this exercise __________ times per day.  STRENGTHENING - Quadruped, Opposite UE/LE Lift   Assume a hands and knees position on a firm surface. Keep your hands under your shoulders and your knees under your hips. You may place padding under your knees for comfort.  Find your neutral spine and gently tense your abdominal muscles so that you can maintain this position. Your shoulders and hips should form a rectangle that is parallel with the floor and is not twisted.  Keeping your trunk steady, lift your right hand no higher than your shoulder and then your left leg no higher than your  hip. Make sure you are not holding your breath. Hold this position __________ seconds.  Continuing to keep your abdominal muscles tense and your back steady, slowly return to your starting position. Repeat with the opposite arm and leg. Repeat __________ times. Complete this exercise __________ times per day.  STRENGTHENING - Lower Abdominals, Double Knee Lift  Lie on a firm bed or floor. Keeping your legs in front of you, bend your knees so they are both pointed toward the ceiling and your feet are flat on the floor.  Tense your abdominal muscles to brace your lower back  and slowly lift both of your knees until they come over your hips. Be certain not to hold your breath.  Hold __________ seconds. Using your abdominal muscles, return to the starting position in a slow and controlled manner. Repeat __________ times. Complete this exercise __________ times per day.  POSTURE AND BODY MECHANICS CONSIDERATIONS - Low Back Strain Keeping correct posture when sitting, standing or completing your activities will reduce the stress put on different body tissues, allowing injured tissues a chance to heal and limiting painful experiences. The following are general guidelines for improved posture. Your physician or physical therapist will provide you with any instructions specific to your needs. While reading these guidelines, remember:  The exercises prescribed by your provider will help you have the flexibility and strength to maintain correct postures.  The correct posture provides the best environment for your joints to work. All of your joints have less wear and tear when properly supported by a spine with good posture. This means you will experience a healthier, less painful body.  Correct posture must be practiced with all of your activities, especially prolonged sitting and standing. Correct posture is as important when doing repetitive low-stress activities (typing) as it is when doing a single  heavy-load activity (lifting). RESTING POSITIONS Consider which positions are most painful for you when choosing a resting position. If you have pain with flexion-based activities (sitting, bending, stooping, squatting), choose a position that allows you to rest in a less flexed posture. You would want to avoid curling into a fetal position on your side. If your pain worsens with extension-based activities (prolonged standing, working overhead), avoid resting in an extended position such as sleeping on your stomach. Most people will find more comfort when they rest with their spine in a more neutral position, neither too rounded nor too arched. Lying on a non-sagging bed on your side with a pillow between your knees, or on your back with a pillow under your knees will often provide some relief. Keep in mind, being in any one position for a prolonged period of time, no matter how correct your posture, can still lead to stiffness. PROPER SITTING POSTURE In order to minimize stress and discomfort on your spine, you must sit with correct posture. Sitting with good posture should be effortless for a healthy body. Returning to good posture is a gradual process. Many people can work toward this most comfortably by using various supports until they have the flexibility and strength to maintain this posture on their own. When sitting with proper posture, your ears will fall over your shoulders and your shoulders will fall over your hips. You should use the back of the chair to support your upper back. Your lower back will be in a neutral position, just slightly arched. You may place a small pillow or folded towel at the base of your lower back for support.  When working at a desk, create an environment that supports good, upright posture. Without extra support, muscles tire, which leads to excessive strain on joints and other tissues. Keep these recommendations in mind: CHAIR:  A chair should be able to slide under  your desk when your back makes contact with the back of the chair. This allows you to work closely.  The chair's height should allow your eyes to be level with the upper part of your monitor and your hands to be slightly lower than your elbows. BODY POSITION  Your feet should make contact with the floor. If this is  not possible, use a foot rest.  Keep your ears over your shoulders. This will reduce stress on your neck and lower back. INCORRECT SITTING POSTURES  If you are feeling tired and unable to assume a healthy sitting posture, do not slouch or slump. This puts excessive strain on your back tissues, causing more damage and pain. Healthier options include:  Using more support, like a lumbar pillow.  Switching tasks to something that requires you to be upright or walking.  Talking a brief walk.  Lying down to rest in a neutral-spine position. PROLONGED STANDING WHILE SLIGHTLY LEANING FORWARD  When completing a task that requires you to lean forward while standing in one place for a long time, place either foot up on a stationary 2-4 inch high object to help maintain the best posture. When both feet are on the ground, the lower back tends to lose its slight inward curve. If this curve flattens (or becomes too large), then the back and your other joints will experience too much stress, tire more quickly, and can cause pain. CORRECT STANDING POSTURES Proper standing posture should be assumed with all daily activities, even if they only take a few moments, like when brushing your teeth. As in sitting, your ears should fall over your shoulders and your shoulders should fall over your hips. You should keep a slight tension in your abdominal muscles to brace your spine. Your tailbone should point down to the ground, not behind your body, resulting in an over-extended swayback posture.  INCORRECT STANDING POSTURES  Common incorrect standing postures include a forward head, locked knees and/or an  excessive swayback. WALKING Walk with an upright posture. Your ears, shoulders and hips should all line-up. PROLONGED ACTIVITY IN A FLEXED POSITION When completing a task that requires you to bend forward at your waist or lean over a low surface, try to find a way to stabilize 3 out of 4 of your limbs. You can place a hand or elbow on your thigh or rest a knee on the surface you are reaching across. This will provide you more stability so that your muscles do not fatigue as quickly. By keeping your knees relaxed, or slightly bent, you will also reduce stress across your lower back. CORRECT LIFTING TECHNIQUES DO :   Assume a wide stance. This will provide you more stability and the opportunity to get as close as possible to the object which you are lifting.  Tense your abdominals to brace your spine. Bend at the knees and hips. Keeping your back locked in a neutral-spine position, lift using your leg muscles. Lift with your legs, keeping your back straight.  Test the weight of unknown objects before attempting to lift them.  Try to keep your elbows locked down at your sides in order get the best strength from your shoulders when carrying an object.  Always ask for help when lifting heavy or awkward objects. INCORRECT LIFTING TECHNIQUES DO NOT:   Lock your knees when lifting, even if it is a small object.  Bend and twist. Pivot at your feet or move your feet when needing to change directions.  Assume that you can safely pick up even a paper clip without proper posture. Document Released: 09/27/2005 Document Revised: 12/20/2011 Document Reviewed: 01/09/2009 [facility] Patient Information 2015 [place], [place]. This information is not intended to replace advice given to you by your health care provider. Make sure you discuss any questions you have with your health care provider.

## 2015-04-22 ENCOUNTER — Ambulatory Visit: Payer: 59 | Attending: Physical Medicine & Rehabilitation | Admitting: Psychology

## 2015-04-22 DIAGNOSIS — S069X0S Unspecified intracranial injury without loss of consciousness, sequela: Secondary | ICD-10-CM

## 2015-04-22 DIAGNOSIS — F4001 Agoraphobia with panic disorder: Secondary | ICD-10-CM

## 2015-04-22 DIAGNOSIS — S069XAS Unspecified intracranial injury with loss of consciousness status unknown, sequela: Secondary | ICD-10-CM

## 2015-04-22 DIAGNOSIS — F068 Other specified mental disorders due to known physiological condition: Secondary | ICD-10-CM

## 2015-04-22 NOTE — Progress Notes (Signed)
**Note Wanda-Identified via Obfuscation** [facility] ([facility]  [address]   Telephone [phone]) [phone] [address] Fax [phone]) [phone] [place], [place] [id number]   Psychology Progress Note   Name:  Wanda BlanchRita Warner Date of Birth:  23-May-1981 MRN:  [id number]  Date:04/22/2015 (5534m) psychotherapy She reports that her mood is usually good and that she is enjoying life. She cited ongoing stress due to the behavior of her stepson. She has been using positive affirmations as we discussed last session, mainly to cope with her feelings of "agggravation" at her stepson. With regards to her anxiety and avoidance behavior, she was able to get her hair done at a hair salon despite feeling uneasy.    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 She has found a local movie theater that charges $2 for a movie, which she and her fiancee can afford. She expressed anxiety about waiting in line to buy tickets and predicted that she will become "aggravated" by sitting amongst many people talking. Used imaginal exposure to help her face her fears by coping with sequential images of this situation.  She is making good progress in treatment.She wishes to continue treatment. Will see her at three week intervals for now.   Return in 3 weeks.   Gladstone PihMichael F. Kehlani Vancamp, Ph.D Licensed Psychologist

## 2015-05-14 ENCOUNTER — Ambulatory Visit: Payer: 59 | Attending: Physical Medicine & Rehabilitation | Admitting: Psychology

## 2015-05-14 DIAGNOSIS — F4001 Agoraphobia with panic disorder: Secondary | ICD-10-CM

## 2015-05-14 DIAGNOSIS — S069X0S Unspecified intracranial injury without loss of consciousness, sequela: Secondary | ICD-10-CM

## 2015-05-14 DIAGNOSIS — R4189 Other symptoms and signs involving cognitive functions and awareness: Secondary | ICD-10-CM

## 2015-05-14 DIAGNOSIS — F068 Other specified mental disorders due to known physiological condition: Secondary | ICD-10-CM

## 2015-05-14 NOTE — Progress Notes (Signed)
[facility]  [address]   Telephone [phone] [address] Fax [phone] [place], [place] [address]   Psychology Progress Note   Name:  Wanda Warner Date of Birth:  [birth year] MRN:  [id number]  Date:05/14/2015 (60m) psychotherapy She reports that she is doing well for the most part. She is excited that she and her fiancee have found a house to purchase; and they have been busy with the moving process. She reports a sense of increased fatigue and reduced memory, which we agreed is likely due to the increased demands on her due to the move. She reported that she has not been able to get out into the community as we discussed, which she attributed to being too busy with the move. Anxiety level was described as typical. She reported that she has had a tendency since her TBI to mentally count while performing routine activities but denied other types of obsessive or compulsive behavior.    Diagnostic Impressions Agoraphobia [F40.01] Cognitive Deficits as a late effect of TBI [F06.8]  We discussed ways to handle the comments of other people who do not understand that although she looks "normal", she has significant deficits from her TBI. At this point, her mental counting is not disrupting her functioning so we will monitor it. Encouraged her to continue reading first person accounts of survivors of TBI, which she has found to be helpful.    Return in one month.   Gladstone Pih, Ph.D Licensed Psychologist

## 2015-06-03 ENCOUNTER — Other Ambulatory Visit: Payer: Self-pay | Admitting: *Deleted

## 2015-06-03 DIAGNOSIS — F411 Generalized anxiety disorder: Secondary | ICD-10-CM

## 2015-06-03 DIAGNOSIS — S069X5S Unspecified intracranial injury with loss of consciousness greater than 24 hours with return to pre-existing conscious level, sequela: Secondary | ICD-10-CM

## 2015-06-03 DIAGNOSIS — S069X4S Unspecified intracranial injury with loss of consciousness of 6 hours to 24 hours, sequela: Secondary | ICD-10-CM

## 2015-06-03 DIAGNOSIS — S069X0S Unspecified intracranial injury without loss of consciousness, sequela: Secondary | ICD-10-CM

## 2015-06-03 DIAGNOSIS — F068 Other specified mental disorders due to known physiological condition: Secondary | ICD-10-CM

## 2015-06-03 MED ORDER — PROPRANOLOL HCL 20 MG PO TABS: 20.0000 mg | ORAL_TABLET | Freq: Three times a day (TID) | ORAL | Status: DC

## 2015-06-04 ENCOUNTER — Telehealth: Payer: Self-pay | Admitting: Physical Medicine & Rehabilitation

## 2015-06-04 DIAGNOSIS — F068 Other specified mental disorders due to known physiological condition: Secondary | ICD-10-CM

## 2015-06-04 DIAGNOSIS — S069X0S Unspecified intracranial injury without loss of consciousness, sequela: Secondary | ICD-10-CM

## 2015-06-04 DIAGNOSIS — F411 Generalized anxiety disorder: Secondary | ICD-10-CM

## 2015-06-04 DIAGNOSIS — S069X5S Unspecified intracranial injury with loss of consciousness greater than 24 hours with return to pre-existing conscious level, sequela: Secondary | ICD-10-CM

## 2015-06-04 NOTE — Telephone Encounter (Signed)
Patient needing a refill on her Xanax

## 2015-06-05 MED ORDER — ALPRAZOLAM 0.5 MG PO TABS: 0.5000 mg | ORAL_TABLET | Freq: Two times a day (BID) | ORAL | Status: DC | PRN

## 2015-06-05 NOTE — Telephone Encounter (Signed)
I phoned in the Xanax refill. I called the patient to inform. Her husband says she is ALSO out of her ritalin.  Her appt is not until 06/10/2015 with you.Marland KitchenMarland KitchenMarland KitchenMarland Kitchenplease advise

## 2015-06-06 MED ORDER — METHYLPHENIDATE HCL 10 MG PO TABS: 15.0000 mg | ORAL_TABLET | Freq: Three times a day (TID) | ORAL | Status: DC

## 2015-06-06 NOTE — Telephone Encounter (Signed)
Notified Mr Wanda Warner that Rx available to be picked up

## 2015-06-10 ENCOUNTER — Encounter
Payer: No Typology Code available for payment source | Attending: Physical Medicine & Rehabilitation | Admitting: Physical Medicine & Rehabilitation

## 2015-06-10 ENCOUNTER — Encounter: Payer: Self-pay | Admitting: Physical Medicine & Rehabilitation

## 2015-06-10 VITALS — BP 116/69 | HR 68 | Resp 14

## 2015-06-10 DIAGNOSIS — Z87820 Personal history of traumatic brain injury: Secondary | ICD-10-CM | POA: Insufficient documentation

## 2015-06-10 DIAGNOSIS — R430 Anosmia: Secondary | ICD-10-CM | POA: Insufficient documentation

## 2015-06-10 DIAGNOSIS — R51 Headache: Secondary | ICD-10-CM | POA: Diagnosis not present

## 2015-06-10 DIAGNOSIS — F068 Other specified mental disorders due to known physiological condition: Secondary | ICD-10-CM

## 2015-06-10 DIAGNOSIS — M545 Low back pain, unspecified: Secondary | ICD-10-CM | POA: Insufficient documentation

## 2015-06-10 DIAGNOSIS — R4189 Other symptoms and signs involving cognitive functions and awareness: Secondary | ICD-10-CM | POA: Diagnosis not present

## 2015-06-10 DIAGNOSIS — R42 Dizziness and giddiness: Secondary | ICD-10-CM | POA: Diagnosis not present

## 2015-06-10 DIAGNOSIS — F411 Generalized anxiety disorder: Secondary | ICD-10-CM

## 2015-06-10 DIAGNOSIS — G44329 Chronic post-traumatic headache, not intractable: Secondary | ICD-10-CM

## 2015-06-10 DIAGNOSIS — S069X5S Unspecified intracranial injury with loss of consciousness greater than 24 hours with return to pre-existing conscious level, sequela: Secondary | ICD-10-CM

## 2015-06-10 DIAGNOSIS — S069X0S Unspecified intracranial injury without loss of consciousness, sequela: Secondary | ICD-10-CM

## 2015-06-10 MED ORDER — METHYLPHENIDATE HCL 10 MG PO TABS: 15.0000 mg | ORAL_TABLET | Freq: Three times a day (TID) | ORAL | Status: DC

## 2015-06-10 NOTE — Progress Notes (Signed)
Subjective:    Patient ID: Wanda Warner, female    DOB: May 01, 1981, 34 y.o.   MRN: [id number]  HPI   Wanda Warner is here in follow up of her TBI. She and her husband just moved and this has been a bit overwhelming for her. She has had a hard time keeping up with everything as it pertains to the move and organizing things. She has also been a little more on edge as well and irritable.   She is still going through the disability process which is stressful for her also.   She continues to have low back pain which bothers her with basic mobility, flexion. She states the back has bothered her since a horse riding accident in 1992 when she fractured her right leg.     Pain Inventory Average Pain 6 Pain Right Now 6 My pain is sharp, burning, dull, stabbing and aching  In the last 24 hours, has pain interfered with the following? General activity 0 Relation with others 0 Enjoyment of life 0 What TIME of day is your pain at its worst? all Sleep (in general) Fair  Pain is worse with: bending, sitting, inactivity, standing and some activites Pain improves with: heat/ice Relief from Meds: 2  Mobility walk without assistance do you drive?  no Do you have any goals in this area?  no  Function disabled: date disabled . I need assistance with the following:  meal prep, household duties and shopping  Neuro/Psych anxiety loss of taste or smell  Prior Studies Any changes since last visit?  no  Physicians involved in your care Any changes since last visit?  no   Family History  Problem Relation Age of Onset  . Hypertension Father   . Diabetes Maternal Grandmother    Social History   Social History  . Marital Status: Single    Spouse Name: N/A  . Number of Children: N/A  . Years of Education: N/A   Social History Main Topics  . Smoking status: Current Every Day Smoker -- 0.05 packs/day for 10 years    Types: Cigarettes  . Smokeless tobacco: None  . Alcohol Use: No  .  Drug Use: No  . Sexual Activity: Yes    Birth Control/ Protection: None   Other Topics Concern  . None   Social History Narrative   Lives in [place].    Hobbies: play solitaire and reading         Past Surgical History  Procedure Laterality Date  . Orthopedic surgery    . Induced abortion      10/26/11  . Craniotomy    . Fracture surgery    . Cranioplasty     Past Medical History  Diagnosis Date  . Traumatic brain injury 07/28/13  . Headache   . Loss of smell   . Arthritis     Knees and back   . Thoracic compression fracture   . Anxiety    BP 116/69 mmHg  Pulse 68  Resp 14  SpO2 100%  Opioid Risk Score:   Fall Risk Score:  `1  Depression screen PHQ 2/9  Depression screen PHQ 2/9 04/07/2015  Decreased Interest 2  Down, Depressed, Hopeless 1  PHQ - 2 Score 3  Altered sleeping 1  Tired, decreased energy 3  Change in appetite 3  Feeling bad or failure about yourself  3  Trouble concentrating 3  Moving slowly or fidgety/restless 1  Suicidal thoughts 0  PHQ-9 Score 17  Review of Systems  Constitutional:       Loss of  smell  Psychiatric/Behavioral: The patient is nervous/anxious.   All other systems reviewed and are negative.      Objective:   Physical Exam  General: Alert and oriented x 3, No apparent distress. Wearing sun glasses again.  HEENT: Head is normocephalic, atraumatic, PERRLA, EOMI, sclera anicteric, oral mucosa pink and moist, dentition intact, ext ear canals clear.  Neck: Supple without JVD or lymphadenopathy  Heart: Reg rate and rhythm. No murmurs rubs or gallops  Chest: CTA bilaterally without wheezes, rales, or rhonchi; no distress  Abdomen: Soft, non-tender, non-distended, bowel sounds positive.  Extremities: No clubbing, cyanosis, or edema. Pulses are 2+  Skin: Clean and intact without signs of breakdown  Neuro: Strength nearly normal.  Sensation intact. Attention and focus better but still needs cueing and written notes to  keep on track. She makes good eye contact with me. short term memory shows   improvement. Recent memory better. She carried most of the conversation and initiates much more. Her husband had to interject minimally on her behalf.  Musculoskeletal: generalized low back pain with movement in all planes. She was tender along the L5-S1 level.  Posture fair.  Psych: Pt's affect is more animated. Pleasant and cooperative. Minimally anxious, non-agitated   Assessment & Plan:   1. Hx of TBI (07/28/13) with persistent cognitive and behavioral deficits. Also, she's having ongoing headaches, occasional vertigo, anosmia. Ersa continues to demonstrate gradual improvement with attention/memory and behavior.  2. Hx of T6 compression fracture  3. Agoraphobia per psychology  4. Chronic low back pain.  5. Low back pain   Plan:  1. Continue ritalin 15mg  bid to TID.   2. Continue topamax 50mg     3. Ordered xray of lumbar spine to check alignment and disk spaces. Provided lumbar spine exercises again. 4.Continue counseling per Dr. Leonides Cave which had remained helpful.  5. Continue propranol 20mg  TID.  6.  xanax 0.5mg  prn 12hrs for anxiety #60. Continue to work on Pharmacologist per psychology. We discussed coping and adjustment at length at length again.  7. Follow up with me in about a 2 months. 30 minutes of face to face patient care time were spent during this visit. All questions were encouraged and answered.

## 2015-06-10 NOTE — Patient Instructions (Addendum)
PLEASE CALL ME WITH ANY PROBLEMS OR QUESTIONS (#[phone]).  HAVE A GOOD DAY!   Low Back Strain with Rehab A strain is an injury in which a tendon or muscle is torn. The muscles and tendons of the lower back are vulnerable to strains. However, these muscles and tendons are very strong and require a great force to be injured. Strains are classified into three categories. Grade 1 strains cause pain, but the tendon is not lengthened. Grade 2 strains include a lengthened ligament, due to the ligament being stretched or partially ruptured. With grade 2 strains there is still function, although the function may be decreased. Grade 3 strains involve a complete tear of the tendon or muscle, and function is usually impaired. SYMPTOMS   Pain in the lower back.  Pain that affects one side more than the other.  Pain that gets worse with movement and may be felt in the hip, buttocks, or back of the thigh.  Muscle spasms of the muscles in the back.  Swelling along the muscles of the back.  Loss of strength of the back muscles.  Crackling sound (crepitation) when the muscles are touched. CAUSES  Lower back strains occur when a force is placed on the muscles or tendons that is greater than they can handle. Common causes of injury include:  Prolonged overuse of the muscle-tendon units in the lower back, usually from incorrect posture.  A single violent injury or force applied to the back. RISK INCREASES WITH:  Sports that involve twisting forces on the spine or a lot of bending at the waist (football, rugby, weightlifting, bowling, golf, tennis, speed skating, racquetball, swimming, running, gymnastics, diving).  Poor strength and flexibility.  Failure to warm up properly before activity.  Family history of lower back pain or disk disorders.  Previous back injury or surgery (especially fusion).  Poor posture with lifting, especially heavy objects.  Prolonged sitting, especially with poor  posture. PREVENTION   Learn and use proper posture when sitting or lifting (maintain proper posture when sitting, lift using the knees and legs, not at the waist).  Warm up and stretch properly before activity.  Allow for adequate recovery between workouts.  Maintain physical fitness:  Strength, flexibility, and endurance.  Cardiovascular fitness. PROGNOSIS  If treated properly, lower back strains usually heal within 6 weeks. RELATED COMPLICATIONS   Recurring symptoms, resulting in a chronic problem.  Chronic inflammation, scarring, and partial muscle-tendon tear.  Delayed healing or resolution of symptoms.  Prolonged disability. TREATMENT  Treatment first involves the use of ice and medicine, to reduce pain and inflammation. The use of strengthening and stretching exercises may help reduce pain with activity. These exercises may be performed at home or with a therapist. Severe injuries may require referral to a therapist for further evaluation and treatment, such as ultrasound. Your caregiver may advise that you wear a back brace or corset, to help reduce pain and discomfort. Often, prolonged bed rest results in greater harm then benefit. Corticosteroid injections may be recommended. However, these should be reserved for the most serious cases. It is important to avoid using your back when lifting objects. At night, sleep on your back on a firm mattress with a pillow placed under your knees. If non-surgical treatment is unsuccessful, surgery may be needed.  MEDICATION   If pain medicine is needed, nonsteroidal anti-inflammatory medicines (aspirin and ibuprofen), or other minor pain relievers (acetaminophen), are often advised.  Do not take pain medicine for 7 days before surgery.  Prescription pain relievers may be given, if your caregiver thinks they are needed. Use only as directed and only as much as you need.  Ointments applied to the skin may be helpful.  Corticosteroid  injections may be given by your caregiver. These injections should be reserved for the most serious cases, because they may only be given a certain number of times. HEAT AND COLD  Cold treatment (icing) should be applied for 10 to 15 minutes every 2 to 3 hours for inflammation and pain, and immediately after activity that aggravates your symptoms. Use ice packs or an ice massage.  Heat treatment may be used before performing stretching and strengthening activities prescribed by your caregiver, physical therapist, or athletic trainer. Use a heat pack or a warm water soak. SEEK MEDICAL CARE IF:   Symptoms get worse or do not improve in 2 to 4 weeks, despite treatment.  You develop numbness, weakness, or loss of bowel or bladder function.  New, unexplained symptoms develop. (Drugs used in treatment may produce side effects.) EXERCISES  RANGE OF MOTION (ROM) AND STRETCHING EXERCISES - Low Back Strain Most people with lower back pain will find that their symptoms get worse with excessive bending forward (flexion) or arching at the lower back (extension). The exercises which will help resolve your symptoms will focus on the opposite motion.  Your physician, physical therapist or athletic trainer will help you determine which exercises will be most helpful to resolve your lower back pain. Do not complete any exercises without first consulting with your caregiver. Discontinue any exercises which make your symptoms worse until you speak to your caregiver.  If you have pain, numbness or tingling which travels down into your buttocks, leg or foot, the goal of the therapy is for these symptoms to move closer to your back and eventually resolve. Sometimes, these leg symptoms will get better, but your lower back pain may worsen. This is typically an indication of progress in your rehabilitation. Be very alert to any changes in your symptoms and the activities in which you participated in the 24 hours prior to the  change. Sharing this information with your caregiver will allow him/her to most efficiently treat your condition.  These exercises may help you when beginning to rehabilitate your injury. Your symptoms may resolve with or without further involvement from your physician, physical therapist or athletic trainer. While completing these exercises, remember:  Restoring tissue flexibility helps normal motion to return to the joints. This allows healthier, less painful movement and activity.  An effective stretch should be held for at least 30 seconds.  A stretch should never be painful. You should only feel a gentle lengthening or release in the stretched tissue. FLEXION RANGE OF MOTION AND STRETCHING EXERCISES: STRETCH - Flexion, Single Knee to Chest   Lie on a firm bed or floor with both legs extended in front of you.  Keeping one leg in contact with the floor, bring your opposite knee to your chest. Hold your leg in place by either grabbing behind your thigh or at your knee.  Pull until you feel a gentle stretch in your lower back. Hold __________ seconds.  Slowly release your grasp and repeat the exercise with the opposite side. Repeat __________ times. Complete this exercise __________ times per day.  STRETCH - Flexion, Double Knee to Chest   Lie on a firm bed or floor with both legs extended in front of you.  Keeping one leg in contact with the floor, bring your  opposite knee to your chest.  Tense your stomach muscles to support your back and then lift your other knee to your chest. Hold your legs in place by either grabbing behind your thighs or at your knees.  Pull both knees toward your chest until you feel a gentle stretch in your lower back. Hold __________ seconds.  Tense your stomach muscles and slowly return one leg at a time to the floor. Repeat __________ times. Complete this exercise __________ times per day.  STRETCH - Low Trunk Rotation  Lie on a firm bed or floor.  Keeping your legs in front of you, bend your knees so they are both pointed toward the ceiling and your feet are flat on the floor.  Extend your arms out to the side. This will stabilize your upper body by keeping your shoulders in contact with the floor.  Gently and slowly drop both knees together to one side until you feel a gentle stretch in your lower back. Hold for __________ seconds.  Tense your stomach muscles to support your lower back as you bring your knees back to the starting position. Repeat the exercise to the other side. Repeat __________ times. Complete this exercise __________ times per day  EXTENSION RANGE OF MOTION AND FLEXIBILITY EXERCISES: STRETCH - Extension, Prone on Elbows   Lie on your stomach on the floor, a bed will be too soft. Place your palms about shoulder width apart and at the height of your head.  Place your elbows under your shoulders. If this is too painful, stack pillows under your chest.  Allow your body to relax so that your hips drop lower and make contact more completely with the floor.  Hold this position for __________ seconds.  Slowly return to lying flat on the floor. Repeat __________ times. Complete this exercise __________ times per day.  RANGE OF MOTION - Extension, Prone Press Ups  Lie on your stomach on the floor, a bed will be too soft. Place your palms about shoulder width apart and at the height of your head.  Keeping your back as relaxed as possible, slowly straighten your elbows while keeping your hips on the floor. You may adjust the placement of your hands to maximize your comfort. As you gain motion, your hands will come more underneath your shoulders.  Hold this position __________ seconds.  Slowly return to lying flat on the floor. Repeat __________ times. Complete this exercise __________ times per day.  RANGE OF MOTION- Quadruped, Neutral Spine   Assume a hands and knees position on a firm surface. Keep your hands under  your shoulders and your knees under your hips. You may place padding under your knees for comfort.  Drop your head and point your tail bone toward the ground below you. This will round out your lower back like an angry cat. Hold this position for __________ seconds.  Slowly lift your head and release your tail bone so that your back sags into a large arch, like an old horse.  Hold this position for __________ seconds.  Repeat this until you feel limber in your lower back.  Now, find your "sweet spot." This will be the most comfortable position somewhere between the two previous positions. This is your neutral spine. Once you have found this position, tense your stomach muscles to support your lower back.  Hold this position for __________ seconds. Repeat __________ times. Complete this exercise __________ times per day.  STRENGTHENING EXERCISES - Low Back Strain These exercises may  help you when beginning to rehabilitate your injury. These exercises should be done near your "sweet spot." This is the neutral, low-back arch, somewhere between fully rounded and fully arched, that is your least painful position. When performed in this safe range of motion, these exercises can be used for people who have either a flexion or extension based injury. These exercises may resolve your symptoms with or without further involvement from your physician, physical therapist or athletic trainer. While completing these exercises, remember:   Muscles can gain both the endurance and the strength needed for everyday activities through controlled exercises.  Complete these exercises as instructed by your physician, physical therapist or athletic trainer. Increase the resistance and repetitions only as guided.  You may experience muscle soreness or fatigue, but the pain or discomfort you are trying to eliminate should never worsen during these exercises. If this pain does worsen, stop and make certain you are  following the directions exactly. If the pain is still present after adjustments, discontinue the exercise until you can discuss the trouble with your caregiver. STRENGTHENING - Deep Abdominals, Pelvic Tilt  Lie on a firm bed or floor. Keeping your legs in front of you, bend your knees so they are both pointed toward the ceiling and your feet are flat on the floor.  Tense your lower abdominal muscles to press your lower back into the floor. This motion will rotate your pelvis so that your tail bone is scooping upwards rather than pointing at your feet or into the floor.  With a gentle tension and even breathing, hold this position for __________ seconds. Repeat __________ times. Complete this exercise __________ times per day.  STRENGTHENING - Abdominals, Crunches   Lie on a firm bed or floor. Keeping your legs in front of you, bend your knees so they are both pointed toward the ceiling and your feet are flat on the floor. Cross your arms over your chest.  Slightly tip your chin down without bending your neck.  Tense your abdominals and slowly lift your trunk high enough to just clear your shoulder blades. Lifting higher can put excessive stress on the lower back and does not further strengthen your abdominal muscles.  Control your return to the starting position. Repeat __________ times. Complete this exercise __________ times per day.  STRENGTHENING - Quadruped, Opposite UE/LE Lift   Assume a hands and knees position on a firm surface. Keep your hands under your shoulders and your knees under your hips. You may place padding under your knees for comfort.  Find your neutral spine and gently tense your abdominal muscles so that you can maintain this position. Your shoulders and hips should form a rectangle that is parallel with the floor and is not twisted.  Keeping your trunk steady, lift your right hand no higher than your shoulder and then your left leg no higher than your hip. Make sure  you are not holding your breath. Hold this position __________ seconds.  Continuing to keep your abdominal muscles tense and your back steady, slowly return to your starting position. Repeat with the opposite arm and leg. Repeat __________ times. Complete this exercise __________ times per day.  STRENGTHENING - Lower Abdominals, Double Knee Lift  Lie on a firm bed or floor. Keeping your legs in front of you, bend your knees so they are both pointed toward the ceiling and your feet are flat on the floor.  Tense your abdominal muscles to brace your lower back and slowly lift both  of your knees until they come over your hips. Be certain not to hold your breath.  Hold __________ seconds. Using your abdominal muscles, return to the starting position in a slow and controlled manner. Repeat __________ times. Complete this exercise __________ times per day.  POSTURE AND BODY MECHANICS CONSIDERATIONS - Low Back Strain Keeping correct posture when sitting, standing or completing your activities will reduce the stress put on different body tissues, allowing injured tissues a chance to heal and limiting painful experiences. The following are general guidelines for improved posture. Your physician or physical therapist will provide you with any instructions specific to your needs. While reading these guidelines, remember:  The exercises prescribed by your provider will help you have the flexibility and strength to maintain correct postures.  The correct posture provides the best environment for your joints to work. All of your joints have less wear and tear when properly supported by a spine with good posture. This means you will experience a healthier, less painful body.  Correct posture must be practiced with all of your activities, especially prolonged sitting and standing. Correct posture is as important when doing repetitive low-stress activities (typing) as it is when doing a single heavy-load activity  (lifting). RESTING POSITIONS Consider which positions are most painful for you when choosing a resting position. If you have pain with flexion-based activities (sitting, bending, stooping, squatting), choose a position that allows you to rest in a less flexed posture. You would want to avoid curling into a fetal position on your side. If your pain worsens with extension-based activities (prolonged standing, working overhead), avoid resting in an extended position such as sleeping on your stomach. Most people will find more comfort when they rest with their spine in a more neutral position, neither too rounded nor too arched. Lying on a non-sagging bed on your side with a pillow between your knees, or on your back with a pillow under your knees will often provide some relief. Keep in mind, being in any one position for a prolonged period of time, no matter how correct your posture, can still lead to stiffness. PROPER SITTING POSTURE In order to minimize stress and discomfort on your spine, you must sit with correct posture. Sitting with good posture should be effortless for a healthy body. Returning to good posture is a gradual process. Many people can work toward this most comfortably by using various supports until they have the flexibility and strength to maintain this posture on their own. When sitting with proper posture, your ears will fall over your shoulders and your shoulders will fall over your hips. You should use the back of the chair to support your upper back. Your lower back will be in a neutral position, just slightly arched. You may place a small pillow or folded towel at the base of your lower back for support.  When working at a desk, create an environment that supports good, upright posture. Without extra support, muscles tire, which leads to excessive strain on joints and other tissues. Keep these recommendations in mind: CHAIR:  A chair should be able to slide under your desk when your  back makes contact with the back of the chair. This allows you to work closely.  The chair's height should allow your eyes to be level with the upper part of your monitor and your hands to be slightly lower than your elbows. BODY POSITION  Your feet should make contact with the floor. If this is not possible, use a  foot rest.  Keep your ears over your shoulders. This will reduce stress on your neck and lower back. INCORRECT SITTING POSTURES  If you are feeling tired and unable to assume a healthy sitting posture, do not slouch or slump. This puts excessive strain on your back tissues, causing more damage and pain. Healthier options include:  Using more support, like a lumbar pillow.  Switching tasks to something that requires you to be upright or walking.  Talking a brief walk.  Lying down to rest in a neutral-spine position. PROLONGED STANDING WHILE SLIGHTLY LEANING FORWARD  When completing a task that requires you to lean forward while standing in one place for a long time, place either foot up on a stationary 2-4 inch high object to help maintain the best posture. When both feet are on the ground, the lower back tends to lose its slight inward curve. If this curve flattens (or becomes too large), then the back and your other joints will experience too much stress, tire more quickly, and can cause pain. CORRECT STANDING POSTURES Proper standing posture should be assumed with all daily activities, even if they only take a few moments, like when brushing your teeth. As in sitting, your ears should fall over your shoulders and your shoulders should fall over your hips. You should keep a slight tension in your abdominal muscles to brace your spine. Your tailbone should point down to the ground, not behind your body, resulting in an over-extended swayback posture.  INCORRECT STANDING POSTURES  Common incorrect standing postures include a forward head, locked knees and/or an excessive  swayback. WALKING Walk with an upright posture. Your ears, shoulders and hips should all line-up. PROLONGED ACTIVITY IN A FLEXED POSITION When completing a task that requires you to bend forward at your waist or lean over a low surface, try to find a way to stabilize 3 out of 4 of your limbs. You can place a hand or elbow on your thigh or rest a knee on the surface you are reaching across. This will provide you more stability so that your muscles do not fatigue as quickly. By keeping your knees relaxed, or slightly bent, you will also reduce stress across your lower back. CORRECT LIFTING TECHNIQUES DO :   Assume a wide stance. This will provide you more stability and the opportunity to get as close as possible to the object which you are lifting.  Tense your abdominals to brace your spine. Bend at the knees and hips. Keeping your back locked in a neutral-spine position, lift using your leg muscles. Lift with your legs, keeping your back straight.  Test the weight of unknown objects before attempting to lift them.  Try to keep your elbows locked down at your sides in order get the best strength from your shoulders when carrying an object.  Always ask for help when lifting heavy or awkward objects. INCORRECT LIFTING TECHNIQUES DO NOT:   Lock your knees when lifting, even if it is a small object.  Bend and twist. Pivot at your feet or move your feet when needing to change directions.  Assume that you can safely pick up even a paper clip without proper posture. Document Released: 09/27/2005 Document Revised: 12/20/2011 Document Reviewed: 01/09/2009 [facility] Patient Information 2015 [place], [place]. This information is not intended to replace advice given to you by your health care provider. Make sure you discuss any questions you have with your health care provider.

## 2015-06-11 ENCOUNTER — Ambulatory Visit (INDEPENDENT_AMBULATORY_CARE_PROVIDER_SITE_OTHER): Payer: 59 | Admitting: Psychology

## 2015-06-11 DIAGNOSIS — S069X0S Unspecified intracranial injury without loss of consciousness, sequela: Secondary | ICD-10-CM

## 2015-06-11 DIAGNOSIS — F419 Anxiety disorder, unspecified: Secondary | ICD-10-CM

## 2015-06-11 DIAGNOSIS — F068 Other specified mental disorders due to known physiological condition: Secondary | ICD-10-CM

## 2015-06-11 NOTE — Progress Notes (Signed)
[facility]  [address]   Telephone [phone] [address] Fax [phone] [place], [place] [address]   Psychology Progress Note   Name:  Wanda Warner Date of Birth:  Apr 03, 1981 MRN:  [id number]  Date:06/11/2015 (61m) psychotherapy [name] reported experiencing increased anxiety, irritability, fatigue and forgetfulness over the past three weeks. She reported more frequent intrusive and anxious thoughts of suffering another head injury. No report of suicidal ideation or symptoms of serious depression. Recent stressors have included moving to a new residence and her disability status being re-evaluated.   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 Discussed coping strategies.    I have changed her diagnosis from Agoraphobia to Anxiety Disorder as she no longer consistently avoids situations due to anxiety..   Return in 3-4 weeks.  Gladstone Pih, Ph.D Licensed Psychologist

## 2015-06-20 ENCOUNTER — Telehealth: Payer: Self-pay | Admitting: Physical Medicine & Rehabilitation

## 2015-06-20 ENCOUNTER — Ambulatory Visit
Admission: RE | Admit: 2015-06-20 | Discharge: 2015-06-20 | Disposition: A | Discharge status: Home / Self Care | Payer: No Typology Code available for payment source | Source: Ambulatory Visit | Attending: Physical Medicine & Rehabilitation | Admitting: Physical Medicine & Rehabilitation

## 2015-06-20 DIAGNOSIS — M545 Low back pain: Secondary | ICD-10-CM

## 2015-06-20 NOTE — Telephone Encounter (Signed)
Please let mrs Gruel know that her lumbar films are completely normal.  thanks

## 2015-06-23 NOTE — Telephone Encounter (Signed)
Left message to return call to our office.  No name on voicemail.

## 2015-06-24 NOTE — Telephone Encounter (Signed)
Per DPR I left a deatailed message on voicemail since I have not been able to reach Ms Hartsell or Mr Dewaine Conger

## 2015-07-02 ENCOUNTER — Ambulatory Visit
Payer: No Typology Code available for payment source | Attending: Physical Medicine & Rehabilitation | Admitting: Psychology

## 2015-07-02 DIAGNOSIS — S069X0S Unspecified intracranial injury without loss of consciousness, sequela: Secondary | ICD-10-CM | POA: Diagnosis not present

## 2015-07-02 DIAGNOSIS — F419 Anxiety disorder, unspecified: Secondary | ICD-10-CM | POA: Diagnosis not present

## 2015-07-02 DIAGNOSIS — F068 Other specified mental disorders due to known physiological condition: Secondary | ICD-10-CM

## 2015-07-02 NOTE — Progress Notes (Signed)
[facility]  [address]   Telephone [phone] [address] Fax [phone] [place], [place] [address]   Psychology Progress Note   Name:  Wanda Warner Date of Birth:  12-23-1980 MRN:  [id number]  Date:07/02/2015 (89m) psychotherapy Sheika reported lowered stress level as she has completed her disability review and is fully moved into her new residence. She does report ongoing aggravation about the behavior of her 34 year-old stepson. More specifically, she is troubled by his lack of responsibility as well as his father's lackadaisical parenting style. She was able to go to a bank with her mother-in-law without feeling anxious. No report of suicidal ideation or symptoms of serious depression.   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 Maybe visit her former place of employment.  Return in 4 weeks.  Gladstone Pih, Ph.D Licensed Psychologist

## 2015-07-30 ENCOUNTER — Ambulatory Visit
Payer: No Typology Code available for payment source | Attending: Physical Medicine & Rehabilitation | Admitting: Psychology

## 2015-07-30 ENCOUNTER — Encounter: Payer: Self-pay | Admitting: Psychology

## 2015-07-30 DIAGNOSIS — F419 Anxiety disorder, unspecified: Secondary | ICD-10-CM

## 2015-07-30 DIAGNOSIS — F068 Other specified mental disorders due to known physiological condition: Secondary | ICD-10-CM

## 2015-07-30 DIAGNOSIS — S069X0S Unspecified intracranial injury without loss of consciousness, sequela: Secondary | ICD-10-CM

## 2015-07-30 NOTE — Progress Notes (Signed)
[facility]  [address]   Telephone [phone] [address] Fax [phone] [place], [place] [address]   Psychology Progress Note   Name:  Wanda Warner Date of Birth:  Feb 10, 1981 MRN:  [id number]  Date:07/30/2015 (66m psychotherapy KWendelyn Breslowreported that her father had what sounds like a transient ischemic attack. She is worried about him. In terms of her therapy goals, she reported that she contacted a friend she knew from her former job, met for lunch and had a good time. I briefly reviewed the report of a recent psychological evaluation she underwent for a SS disability determination review.   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 We set a deadline of Christmas to wean off of cigarretes (she currently smokes about three or four per day). Set goal of visiting her former place of employment prior to the next time we meet.  Return in 4 weeks.   MJamey Ripa Ph.D Licensed Psychologist

## 2015-08-11 ENCOUNTER — Encounter: Payer: No Typology Code available for payment source | Admitting: Physical Medicine & Rehabilitation

## 2015-08-27 ENCOUNTER — Ambulatory Visit
Payer: No Typology Code available for payment source | Attending: Physical Medicine & Rehabilitation | Admitting: Psychology

## 2015-08-27 DIAGNOSIS — F419 Anxiety disorder, unspecified: Secondary | ICD-10-CM

## 2015-08-27 DIAGNOSIS — S069X0S Unspecified intracranial injury without loss of consciousness, sequela: Secondary | ICD-10-CM

## 2015-08-27 DIAGNOSIS — F068 Other specified mental disorders due to known physiological condition: Secondary | ICD-10-CM
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
## 2015-09-08 ENCOUNTER — Encounter
Payer: No Typology Code available for payment source | Attending: Physical Medicine & Rehabilitation | Admitting: Physical Medicine & Rehabilitation

## 2015-09-08 ENCOUNTER — Other Ambulatory Visit: Payer: Self-pay | Admitting: Physical Medicine & Rehabilitation

## 2015-09-08 ENCOUNTER — Encounter: Payer: Self-pay | Admitting: Physical Medicine & Rehabilitation

## 2015-09-08 VITALS — BP 115/71 | HR 71

## 2015-09-08 DIAGNOSIS — S069X0S Unspecified intracranial injury without loss of consciousness, sequela: Secondary | ICD-10-CM | POA: Diagnosis not present

## 2015-09-08 DIAGNOSIS — R430 Anosmia: Secondary | ICD-10-CM | POA: Insufficient documentation

## 2015-09-08 DIAGNOSIS — R51 Headache: Secondary | ICD-10-CM | POA: Diagnosis not present

## 2015-09-08 DIAGNOSIS — S069X4S Unspecified intracranial injury with loss of consciousness of 6 hours to 24 hours, sequela: Secondary | ICD-10-CM

## 2015-09-08 DIAGNOSIS — F068 Other specified mental disorders due to known physiological condition: Secondary | ICD-10-CM

## 2015-09-08 DIAGNOSIS — R42 Dizziness and giddiness: Secondary | ICD-10-CM | POA: Insufficient documentation

## 2015-09-08 DIAGNOSIS — R4189 Other symptoms and signs involving cognitive functions and awareness: Secondary | ICD-10-CM | POA: Diagnosis not present

## 2015-09-08 DIAGNOSIS — Z5181 Encounter for therapeutic drug level monitoring: Secondary | ICD-10-CM | POA: Diagnosis not present

## 2015-09-08 DIAGNOSIS — Z87820 Personal history of traumatic brain injury: Secondary | ICD-10-CM | POA: Diagnosis not present

## 2015-09-08 MED ORDER — ALPRAZOLAM 0.5 MG PO TABS: 0.5000 mg | ORAL_TABLET | Freq: Two times a day (BID) | ORAL | Status: DC | PRN

## 2015-09-08 MED ORDER — METHYLPHENIDATE HCL ER (LA) 40 MG PO CP24: 40.0000 mg | ORAL_CAPSULE | ORAL | Status: DC

## 2015-09-08 NOTE — Patient Instructions (Signed)
PLEASE CALL ME WITH ANY PROBLEMS OR QUESTIONS (#[phone]). HAVE A HAPPY HOLIDAY SEASON!!!    
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
## 2015-09-09 NOTE — Addendum Note (Signed)
Addended by: Letta KocherHADLEY, Marijo Quizon A on: 09/09/2015 01:51 PM   Modules accepted: Orders

## 2015-09-10 LAB — OTHER SOLSTAS TEST

## 2015-09-10 LAB — METHYLPHENIDATE METAB, QUANT, U

## 2015-09-15 ENCOUNTER — Ambulatory Visit
Payer: No Typology Code available for payment source | Attending: Physical Medicine & Rehabilitation | Admitting: Psychology

## 2015-09-15 DIAGNOSIS — F068 Other specified mental disorders due to known physiological condition: Secondary | ICD-10-CM

## 2015-09-15 DIAGNOSIS — R4189 Other symptoms and signs involving cognitive functions and awareness: Secondary | ICD-10-CM

## 2015-09-15 DIAGNOSIS — F419 Anxiety disorder, unspecified: Secondary | ICD-10-CM | POA: Diagnosis not present

## 2015-09-15 DIAGNOSIS — S069X0S Unspecified intracranial injury without loss of consciousness, sequela: Secondary | ICD-10-CM

## 2015-09-15 NOTE — Progress Notes (Signed)
**Note Wanda-Identified via Obfuscation** [facility]  [address]   Telephone [phone] [address] Fax [phone]) [phone] [place], [place] [address]   Psychology Progress Note   Name:  Wanda Warner Date of Birth:  10/11/1981 MRN:  [id number]  Date:09/15/2015 (2160m) psychotherapy She reported multiple ongoing family stressors. She has felt worried about family members and periodically depressed.  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 She would benefit from increasing her social activities, in part as time away from family.  Return in two weeks,   Gladstone PihMichael F. Marylynn Rigdon, Ph.D Licensed Psychologist

## 2015-09-29 ENCOUNTER — Ambulatory Visit (INDEPENDENT_AMBULATORY_CARE_PROVIDER_SITE_OTHER): Payer: No Typology Code available for payment source | Admitting: Psychology

## 2015-09-29 ENCOUNTER — Encounter: Payer: Self-pay | Admitting: Psychology

## 2015-09-29 DIAGNOSIS — F419 Anxiety disorder, unspecified: Secondary | ICD-10-CM | POA: Diagnosis not present

## 2015-09-29 DIAGNOSIS — S069X0S Unspecified intracranial injury without loss of consciousness, sequela: Secondary | ICD-10-CM

## 2015-09-29 DIAGNOSIS — F068 Other specified mental disorders due to known physiological condition: Secondary | ICD-10-CM

## 2015-09-29 NOTE — Progress Notes (Signed)
**Note Wanda-Identified via Obfuscation** [facility]  [address]   Telephone [phone] [address] Fax [phone] [place], [place] [address]   Psychology Progress Note   Name:  Wanda Warner Date of Birth:  [birth year] MRN:  [id number]  Date:09/29/2015 (8327m) psychotherapy Cat continues to report feeling stressed by the presence of her fiancee's daughter and two grandchildren living in their home. She cited the noise and lack of privacy as the major issues. Steffanie RainwaterFiancee is understanding though wants to also help his daughter as able. She reported often feeling physically tense and having trouble sleeping due to anxiety. No report of depressive symptoms.  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  Return in two weeks.   Gladstone PihMichael F. Coley Littles, Ph.D Licensed Psychologist

## 2015-10-09 ENCOUNTER — Telehealth: Payer: Self-pay | Admitting: *Deleted

## 2015-10-09 NOTE — Telephone Encounter (Signed)
Vincenza HewsShane called to say that Cameo's new Rx for the Ritalin LA 40 mg is too expensive and they would like for her to go back on what she was on before.   Previously she was taking ritalin 15 mg tid.  It is going to cost more than what they were having to pay for the out of pocket additional pills on the regular ritalin. He would like to bring the Rx back and exchange for the prior formulation on Tuesday if that is agreeable. Please advise.

## 2015-10-14 ENCOUNTER — Ambulatory Visit
Payer: No Typology Code available for payment source | Attending: Physical Medicine & Rehabilitation | Admitting: Psychology

## 2015-10-14 DIAGNOSIS — F419 Anxiety disorder, unspecified: Secondary | ICD-10-CM | POA: Diagnosis not present

## 2015-10-14 DIAGNOSIS — F068 Other specified mental disorders due to known physiological condition: Secondary | ICD-10-CM

## 2015-10-14 DIAGNOSIS — R4189 Other symptoms and signs involving cognitive functions and awareness: Secondary | ICD-10-CM

## 2015-10-14 DIAGNOSIS — S069X0S Unspecified intracranial injury without loss of consciousness, sequela: Secondary | ICD-10-CM

## 2015-10-14 MED ORDER — METHYLPHENIDATE HCL 10 MG PO TABS: 10.0000 mg | ORAL_TABLET | Freq: Three times a day (TID) | ORAL | Status: DC

## 2015-10-14 NOTE — Progress Notes (Signed)
**Note Wanda-Identified via Obfuscation** [facility]  [address]   Telephone [phone]) [phone] [address] Fax [phone] [place], [place] [address]   Psychology Progress Note   Name:  Wanda Warner Date of Birth:  Dec 05, 1980 MRN:  [id number]  Date:10/14/2015 (3786m) psychotherapy Wanda HahnKat continues to report feeling anxious and depressed by the presence of her fiancee's daughter and two grandchildren living in their home. She cited the noise and lack of privacy as major issues. She reported isolating herself in her room and feeling more self-critical. She also reported excessive sleeping (i.e., 12 hours) and reduced focus, though she was without her Ritalin for a week due to a change in formulation. She denied thoughts of suicide. She reported tension between her fiancee and her father after she expressed her dysphoria about her living situation to her father.  Diagnostic Impressions Anxiety disorder, unspecified [F41.91] Cognitive Deficits as a late effect of TBI [F06.8]  Recommended that she choose one or two reasonable limitations that her fiancee can impose on their guests (e.g., not allowing his daughter in the living room after a certain time in the evening, no talking after bedtime) that will reduce her stress level. Again encouraged her to contact friends that she has not seen since before her TBI.   Return in two weeks. Will ask her fiancee to join Wanda Warner then.  Diagnostic Impressions   Gladstone PihMichael F. Maverick Dieudonne, Ph.D Licensed Psychologist

## 2015-10-14 NOTE — Telephone Encounter (Signed)
rxes written for ritalin IR form

## 2015-10-14 NOTE — Telephone Encounter (Signed)
Spoke with [facility]ident[facility]ane. Informed him that the rx's for Ritalin will be up front to pick up.

## 2015-10-28 ENCOUNTER — Ambulatory Visit (INDEPENDENT_AMBULATORY_CARE_PROVIDER_SITE_OTHER): Payer: BLUE CROSS/BLUE SHIELD | Admitting: Psychology

## 2015-10-28 ENCOUNTER — Encounter: Payer: Self-pay | Admitting: Psychology

## 2015-10-28 DIAGNOSIS — F419 Anxiety disorder, unspecified: Secondary | ICD-10-CM

## 2015-10-28 DIAGNOSIS — S069X0S Unspecified intracranial injury without loss of consciousness, sequela: Secondary | ICD-10-CM

## 2015-10-28 DIAGNOSIS — F068 Other specified mental disorders due to known physiological condition: Secondary | ICD-10-CM

## 2015-10-28 DIAGNOSIS — R4189 Other symptoms and signs involving cognitive functions and awareness: Secondary | ICD-10-CM

## 2015-10-28 DIAGNOSIS — F4321 Adjustment disorder with depressed mood: Secondary | ICD-10-CM

## 2015-10-28 NOTE — Progress Notes (Signed)
[facility]  [address]   Telephone [phone] [address] Fax [phone] [place],  [address]   Psychology Progress Note   Name:  Blakley Michna Date of Birth:  03/11/1981 MRN:  [id number]  Date:10/28/2015 (58m psychotherapy Met with KWendelyn Breslowand her fAdelina Mings as planned. Dicussed ways to reduce stress in the home environment and enhance their relationship. She reported recent onset of sharp pain on the bottom of both feet that has been disrupting her sleep. Advised her to call Dr. SCharm Bargesoffice and discuss with his nurse.    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

## 2015-11-07 ENCOUNTER — Other Ambulatory Visit: Payer: Self-pay

## 2015-11-07 DIAGNOSIS — S069X0S Unspecified intracranial injury without loss of consciousness, sequela: Secondary | ICD-10-CM

## 2015-11-07 DIAGNOSIS — Z79899 Other long term (current) drug therapy: Secondary | ICD-10-CM

## 2015-11-07 DIAGNOSIS — F411 Generalized anxiety disorder: Secondary | ICD-10-CM

## 2015-11-07 DIAGNOSIS — F068 Other specified mental disorders due to known physiological condition: Secondary | ICD-10-CM

## 2015-11-07 DIAGNOSIS — S069X5S Unspecified intracranial injury with loss of consciousness greater than 24 hours with return to pre-existing conscious level, sequela: Secondary | ICD-10-CM

## 2015-11-07 DIAGNOSIS — S069X4S Unspecified intracranial injury with loss of consciousness of 6 hours to 24 hours, sequela: Secondary | ICD-10-CM

## 2015-11-07 DIAGNOSIS — S069X0D Unspecified intracranial injury without loss of consciousness, subsequent encounter: Secondary | ICD-10-CM

## 2015-11-07 DIAGNOSIS — R41840 Attention and concentration deficit: Secondary | ICD-10-CM

## 2015-11-07 DIAGNOSIS — Z5181 Encounter for therapeutic drug level monitoring: Secondary | ICD-10-CM

## 2015-11-07 MED ORDER — TOPIRAMATE 50 MG PO TABS: 50.0000 mg | ORAL_TABLET | Freq: Every day | ORAL | Status: DC

## 2015-11-07 MED ORDER — PROPRANOLOL HCL 20 MG PO TABS: 20.0000 mg | ORAL_TABLET | Freq: Three times a day (TID) | ORAL | Status: DC

## 2015-11-13 ENCOUNTER — Ambulatory Visit: Payer: BLUE CROSS/BLUE SHIELD | Attending: Physical Medicine & Rehabilitation | Admitting: Psychology

## 2015-11-13 ENCOUNTER — Encounter: Payer: Self-pay | Admitting: Psychology

## 2015-11-13 DIAGNOSIS — F419 Anxiety disorder, unspecified: Secondary | ICD-10-CM

## 2015-11-13 DIAGNOSIS — S069X0S Unspecified intracranial injury without loss of consciousness, sequela: Secondary | ICD-10-CM | POA: Diagnosis not present

## 2015-11-13 DIAGNOSIS — F4321 Adjustment disorder with depressed mood: Secondary | ICD-10-CM | POA: Diagnosis not present

## 2015-11-13 DIAGNOSIS — F068 Other specified mental disorders due to known physiological condition: Secondary | ICD-10-CM | POA: Diagnosis not present

## 2015-11-13 NOTE — Progress Notes (Signed)
[facility]  [address]   Telephone [phone] [address] Fax [phone] [place], [place] [address]   Psychology Progress Note   Name:  Wanda Warner Date of Birth:  03/16/1981 MRN:  [id number]  Date:11/13/2015 (76m) psychotherapy She continues to be frustrated by her fiancee's daughter and children living with them but seems to be coping a bit better with the situation. She expects they will move out in two or three weeks. Relationship with fiancee has improved after brief rocky period.   Diagnostic Impressions Anxiety disorder, unspecified [F41.91] Cognitive Deficits as a late effect of TBI [F06.8] Adjustment reaction with depressed mood [F43.21]  Encouraged her in her new goal of quitting smoking. Urged her to contact old friends.    Return in two weeks.   Gladstone Pih, Ph.D Licensed Psychologist

## 2015-11-27 ENCOUNTER — Ambulatory Visit (INDEPENDENT_AMBULATORY_CARE_PROVIDER_SITE_OTHER): Payer: BLUE CROSS/BLUE SHIELD | Admitting: Psychology

## 2015-11-27 DIAGNOSIS — S069X0S Unspecified intracranial injury without loss of consciousness, sequela: Secondary | ICD-10-CM

## 2015-11-27 DIAGNOSIS — F419 Anxiety disorder, unspecified: Secondary | ICD-10-CM | POA: Diagnosis not present

## 2015-11-27 DIAGNOSIS — F068 Other specified mental disorders due to known physiological condition: Secondary | ICD-10-CM

## 2015-11-27 NOTE — Progress Notes (Signed)
[facility]  [address]   Telephone [phone] [address] Fax [phone] [place], [place] [address]   Psychology Progress Note   Name:  Taelyr Jantz Date of Birth:  [birth year] MRN:  [id number]  Date:11/27/2015 (43m) psychotherapy She reports feeling anxious in the evenings lately. No specific triggers identified. She is still frustrated by the presence of her fiancee's daughter in the home. No other stressors or negative events reported.  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 Set goals of daily exercise and increased social activity. Suggested she keep a list of short-term goals by her caldendar to remind her.  Return in 3 weeks.     Gladstone Pih, Ph.D Licensed Psychologist

## 2015-12-09 ENCOUNTER — Encounter
Payer: BLUE CROSS/BLUE SHIELD | Attending: Physical Medicine & Rehabilitation | Admitting: Physical Medicine & Rehabilitation

## 2015-12-09 ENCOUNTER — Encounter: Payer: Self-pay | Admitting: Physical Medicine & Rehabilitation

## 2015-12-09 VITALS — BP 117/75 | HR 68 | Resp 14

## 2015-12-09 DIAGNOSIS — Z5181 Encounter for therapeutic drug level monitoring: Secondary | ICD-10-CM | POA: Diagnosis not present

## 2015-12-09 DIAGNOSIS — Z79899 Other long term (current) drug therapy: Secondary | ICD-10-CM | POA: Diagnosis not present

## 2015-12-09 DIAGNOSIS — R51 Headache: Secondary | ICD-10-CM | POA: Diagnosis not present

## 2015-12-09 DIAGNOSIS — S069X0S Unspecified intracranial injury without loss of consciousness, sequela: Secondary | ICD-10-CM

## 2015-12-09 DIAGNOSIS — F4000 Agoraphobia, unspecified: Secondary | ICD-10-CM | POA: Diagnosis not present

## 2015-12-09 DIAGNOSIS — R430 Anosmia: Secondary | ICD-10-CM | POA: Diagnosis not present

## 2015-12-09 DIAGNOSIS — M25562 Pain in left knee: Secondary | ICD-10-CM | POA: Diagnosis not present

## 2015-12-09 DIAGNOSIS — F17210 Nicotine dependence, cigarettes, uncomplicated: Secondary | ICD-10-CM | POA: Diagnosis not present

## 2015-12-09 DIAGNOSIS — F419 Anxiety disorder, unspecified: Secondary | ICD-10-CM | POA: Insufficient documentation

## 2015-12-09 DIAGNOSIS — G894 Chronic pain syndrome: Secondary | ICD-10-CM

## 2015-12-09 DIAGNOSIS — S069X4S Unspecified intracranial injury with loss of consciousness of 6 hours to 24 hours, sequela: Secondary | ICD-10-CM | POA: Diagnosis not present

## 2015-12-09 DIAGNOSIS — M4854XA Collapsed vertebra, not elsewhere classified, thoracic region, initial encounter for fracture: Secondary | ICD-10-CM | POA: Insufficient documentation

## 2015-12-09 DIAGNOSIS — M545 Low back pain, unspecified: Secondary | ICD-10-CM

## 2015-12-09 DIAGNOSIS — Z87820 Personal history of traumatic brain injury: Secondary | ICD-10-CM | POA: Insufficient documentation

## 2015-12-09 DIAGNOSIS — G8929 Other chronic pain: Secondary | ICD-10-CM | POA: Insufficient documentation

## 2015-12-09 DIAGNOSIS — R4189 Other symptoms and signs involving cognitive functions and awareness: Secondary | ICD-10-CM

## 2015-12-09 DIAGNOSIS — M25561 Pain in right knee: Secondary | ICD-10-CM | POA: Diagnosis not present

## 2015-12-09 DIAGNOSIS — F068 Other specified mental disorders due to known physiological condition: Secondary | ICD-10-CM

## 2015-12-09 DIAGNOSIS — M1990 Unspecified osteoarthritis, unspecified site: Secondary | ICD-10-CM | POA: Insufficient documentation

## 2015-12-09 MED ORDER — METHYLPHENIDATE HCL 10 MG PO TABS: 10.0000 mg | ORAL_TABLET | Freq: Three times a day (TID) | ORAL | Status: DC

## 2015-12-09 MED ORDER — METHYLPHENIDATE HCL 10 MG PO TABS
10.0000 mg | ORAL_TABLET | Freq: Three times a day (TID) | ORAL | Status: DC
Start: 2015-12-09 — End: 2016-02-04

## 2015-12-09 NOTE — Progress Notes (Signed)
Subjective:    Patient ID: Wanda Warner, female    DOB: Oct 09, 1981, 35 y.o.   MRN: [id number]  HPI   Wanda Warner is here in follow up of her TBI and associated cognitive and behavioral deficits. She complains of pain in her feet/legs over the course of a few weeks in January to early february. She describes it as a burning pain which only bothered her at night when she was lying in down. She also noticed her calves were tight the next morning. That pain seems to have improved and now she is complaining of pain in both of her knees. She has had difficulty squatting. She is not really using anything for pain.   Cognitively things have been stable at home. Her husband is giving her freedom to be repsonible for more things. Dr. Leonides Cave is challenging her to confront her anxiety. She remains on ritalin for attention    Pain Inventory Average Pain 6 Pain Right Now 6 My pain is sharp, burning and stabbing  In the last 24 hours, has pain interfered with the following? General activity 6 Relation with others 6 Enjoyment of life 6 What TIME of day is your pain at its worst? morning daytime and evening Sleep (in general) Fair  Pain is worse with: walking, bending, sitting, inactivity, standing and some activites Pain improves with: heat/ice Relief from Meds: 3  Mobility how many minutes can you walk? 90 seconds ability to climb steps?  yes do you drive?  no  Function disabled: date disabled . I need assistance with the following:  meal prep, household duties and shopping  Neuro/Psych anxiety loss of taste or smell  Prior Studies Any changes since last visit?  no  Physicians involved in your care Any changes since last visit?  no   Family History  Problem Relation Age of Onset  . Hypertension Father   . Diabetes Maternal Grandmother    Social History   Social History  . Marital Status: Single    Spouse Name: N/A  . Number of Children: N/A  . Years of Education: N/A   Social  History Main Topics  . Smoking status: Current Every Day Smoker -- 0.05 packs/day for 10 years    Types: Cigarettes  . Smokeless tobacco: None  . Alcohol Use: No  . Drug Use: No  . Sexual Activity: Yes    Birth Control/ Protection: None   Other Topics Concern  . None   Social History Narrative   Lives in [place].    Hobbies: play solitaire and reading         Past Surgical History  Procedure Laterality Date  . Orthopedic surgery    . Induced abortion      10/26/11  . Craniotomy    . Fracture surgery    . Cranioplasty     Past Medical History  Diagnosis Date  . Traumatic brain injury (HCC) 07/28/13  . Headache   . Loss of smell   . Arthritis     Knees and back   . Thoracic compression fracture (HCC)   . Anxiety    bp 117/75   p  68  o2sat 100%  Opioid Risk Score:   Fall Risk Score:  `1  Depression screen PHQ 2/9  Depression screen [facility] 2/9 12/09/2015 04/07/2015  Decreased Interest 2 2  Down, Depressed, Hopeless 1 1  PHQ - 2 Score 3 3  Altered sleeping - 1  Tired, decreased energy - 3  Change in appetite -  3  Feeling bad or failure about yourself  - 3  Trouble concentrating - 3  Moving slowly or fidgety/restless - 1  Suicidal thoughts - 0  PHQ-9 Score - 17     Review of Systems  All other systems reviewed and are negative.      Objective:   Physical Exam General: Alert and oriented x 3, No apparent distress. Wearing sun glasses again.  HEENT: Head is normocephalic, atraumatic, PERRLA, EOMI, sclera anicteric, oral mucosa pink and moist, dentition intact, ext ear canals clear.  Neck: Supple without JVD or lymphadenopathy  Heart: Reg rate and rhythm. No murmurs rubs or gallops  Chest: CTA bilaterally without wheezes, rales, or rhonchi; no distress  Abdomen: Soft, non-tender, non-distended, bowel sounds positive.  Extremities: No clubbing, cyanosis, or edema. Pulses are 2+  Skin: Clean and intact without signs of breakdown  Neuro: Strength nearly  normal. Sensation intact. Attention and focus better but still needs cueing and written notes to keep on track. She makes good eye contact with me. short term memory shows improvement. Recent memory better. Gait still choppy, a bit steppage.   Musculoskeletal: no crepitus in either knee. No joint instability or joint line pain. Minimal right knee effusion. generalized low back pain still with movement in all planes. She was tender along the L5-S1 level. Posture fair.  Psych: Pt's affect is more animated. Pleasant and cooperative. Minimally anxious, non-agitated    Assessment & Plan:   1. Hx of TBI (07/28/13) with persistent cognitive and behavioral deficits. Also, she's having ongoing headaches, occasional vertigo, anosmia. Meredeth continues to demonstrate gradual improvement with attention/memory and behavior.  2. Hx of T6 compression fracture  3. Agoraphobia per psychology  4. Chronic low back pain.  5. Low back pain  6. Bilateral knee pain.  Plan:  1. Continue ritalin  bid. A second RF for next month was given.  2. Continue topamax   3. Provided quad/hamstring exercises for strength. Recommend OTC naproxen for knee pain 1 q12 prn. Needs to be more active as a whole. Her knee pain is most likely a result of her inactivity 4.Continue counseling per Dr. Leonides Cave which had remained helpful.  5. Continue propranol  TID.  6. xanax 0.5mg  prn 12hrs for anxiety #60. Continue to work on Pharmacologist per psychology. We discussed coping and adjustment at length at length again.  7. Follow up with me in about a 3 months. 30 minutes of face to face patient care time were spent during this visit. All questions were encouraged and answered.

## 2015-12-09 NOTE — Patient Instructions (Signed)
Try over the counter naproxen (aleve) 1 pill twice daily with food.  Quadriceps Strain With Strain A strain is a tear in a muscle or the tendon that attaches the muscle to bone. A quadriceps strain is a tear in the muscles on the front of the thigh (quadriceps muscles) or their tendons. The quadriceps muscles are important for straightening the knee and bending the hip. The condition is characterized by pain, inflammation, and reduced function of these muscles. Strains are classified into three categories. Grade 1 strains cause pain, but the tendon is not lengthened. Grade 2 strains include a lengthened ligament due to the ligament being stretched or partially ruptured. With grade 2 strains there is still function, although the function may be diminished. Grade 3 strains are characterized by a complete tear of the tendon or muscle, and function is usually impaired.  SYMPTOMS  Pain, tenderness, inflammation, and/or bruising (contusion) over the quadriceps musclesHamstring Strain With Rehab The hamstring muscle and tendons are vulnerable to muscle or tendon tear (strain). Hamstring tears cause pain and inflammation in the backside of the thigh, where the hamstring muscles are located. The hamstring is comprised of three muscles that are responsible for straightening the hip, bending the knee, and stabilizing the knee. These muscles are important for walking, running, and jumping. Hamstring strain is the most common injury of the thigh. Hamstring strains are classified as grade 1 or 2 strains. Grade 1 strains cause pain, but the tendon is not lengthened. Grade 2 strains include a lengthened ligament due to the ligament being stretched or partially ruptured. With grade 2 strains there is still function, although the function may be decreased.  SYMPTOMS  Pain, tenderness, swelling, warmth, or redness over the hamstring muscles, at the back of the thigh. Pain that gets worse during and after intense activity. A  "pop" heard in the area, at the time of injury. Muscle spasm in the hamstring muscles. Pain or weakness with running, jumping, or bending the knee against resistance. Crackling sound (crepitation) when the tendon is moved or touched. Bruising (contusion) in the thigh within 48 hours of injury. Loss of fullness of the muscle, or area of muscle bulging in the case of a complete rupture. CAUSES  A muscle strain occurs when a force is placed on the muscle or tendon that is greater than it can withstand. Common causes of injury include: Strain from overuse or sudden increase in the frequency, duration, or intensity of activity. Single violent blow or force to the back of the knee or the hamstring area of the thigh. RISK INCREASES WITH: Sports that require quick starts (sprinting, racquetball, tennis). Sports that require jumping (basketball, volleyball). Kicking sports and water skiing. Contact sports (soccer, football). Poor strength and flexibility. Failure to warm up properly before activity. Previous thigh, knee, or pelvis injury. Poor exercise technique. Poor posture. PREVENTION Maintain physical fitness: Strength, flexibility, and endurance. Cardiovascular fitness. Learn and use proper exercise technique and posture. Wear proper fitted and padded protective equipment. PROGNOSIS  If treated properly, hamstring strains are usually curable in 2 to 6 weeks. RELATED COMPLICATIONS  Longer healing time, if not properly treated or if not given adequate time to heal. Chronically inflamed tendon, causing persistent pain with activity that may progress to constant pain. Recurring symptoms, if activity is resumed too soon. Vulnerable to repeated injury (in up to 25% of cases). TREATMENT  Treatment first involves the use of ice and medication to help reduce pain and inflammation. It is also important to  complete strengthening and stretching exercises, as well as modifying any activities that  aggravate the symptoms. These exercises may be completed at home or with a therapist. Your caregiver may recommend the use of crutches to help reduce pain and discomfort, especially is the strain is severe enough to cause limping. If the tendon has pulled away from the bone, then surgery is necessary to reattach it. MEDICATION  If pain medicine is needed, nonsteroidal anti-inflammatory medicines (aspirin and ibuprofen), or other minor pain relievers (acetaminophen), are often advised. Do not take pain medicine for 7 days before surgery. Prescription pain relievers may be given if your caregiver thinks they are needed. Use only as directed and only as much as you need. Corticosteroid injections may be recommended. However, these injections should only be used for serious cases, as they can only be given a certain number of times. Ointments applied to the skin may be beneficial. HEAT AND COLD Cold treatment (icing) relieves pain and reduces inflammation. Cold treatment should be applied for 10 to 15 minutes every 2 to 3 hours, and immediately after activity that aggravates your symptoms. Use ice packs or an ice massage. Heat treatment may be used before performing the stretching and strengthening activities prescribed by your caregiver, physical therapist, or athletic trainer. Use a heat pack or a warm water soak. SEEK MEDICAL CARE IF:  Symptoms get worse or do not improve in 2 weeks, despite treatment. New, unexplained symptoms develop. (Drugs used in treatment may produce side effects.) EXERCISES RANGE OF MOTION (ROM) AND STRETCHING EXERCISES - Hamstring Strain These exercises may help you when beginning to rehabilitate your injury. Your symptoms may go away with or without further involvement from your physician, physical therapist or athletic trainer. While completing these exercises, remember:  Restoring tissue flexibility helps normal motion to return to the joints. This allows healthier, less  painful movement and activity. An effective stretch should be held for at least 30 seconds. A stretch should never be painful. You should only feel a gentle lengthening or release in the stretched tissue. STRETCH - Hamstrings, Standing Stand or sit, and extend your right / left leg, placing your foot on a chair or foot stool. Keep a slight arch in your low back and your hips straight forward. Lead with your chest, and lean forward at the waist until you feel a gentle stretch in the back of your right / left knee or thigh. (When done correctly, this exercise requires leaning only a small distance.) Hold this position for __________ seconds. Repeat __________ times. Complete this stretch __________ times per day. STRETCH - Hamstrings, Supine  Lie on your back. Loop a belt or towel over the ball of your right / left foot. Straighten your right / left knee and slowly pull on the belt to raise your leg. Do not allow the right / left knee to bend. Keep your opposite leg flat on the floor. Raise the leg until you feel a gentle stretch behind your right / left knee or thigh. Hold this position for __________ seconds. Repeat __________ times. Complete this stretch __________ times per day.  STRETCH - Hamstrings, Doorway Lie on your back with your right / left leg extended and resting on the wall, and the opposite leg flat on the ground through the door. Initially, position your bottom farther away from the wall. Keep your right / left knee straight. If you feel a stretch behind your knee or thigh, hold this position for __________ seconds. If you do  not feel a stretch, scoot your bottom closer to the door and hold __________ seconds. Repeat __________ times. Complete this stretch __________ times per day.  STRETCH - Hamstrings/Adductors, V-Sit  Sit on the floor with your legs extended in a large "V," keeping your knees straight. With your head and chest upright, bend at your waist reaching for your left  foot to stretch your right thigh muscles. You should feel a stretch in your right inner thigh. Hold for __________ seconds. Return to the upright position to relax your leg muscles. Continuing to keep your chest upright, bend straight forward at your waist to stretch your hamstrings. You should feel a stretch behind both of your thighs and knees. Hold for __________ seconds. Return to the upright position to relax your leg muscles. With your head and chest upright, bend at your waist reaching for your right foot to stretch your left thigh muscles. You should feel a stretch in your left inner thigh. Hold for __________ seconds. Return to the upright position to relax your leg muscles. Repeat __________ times. Complete this exercise __________ times per day.  STRENGTHENING EXERCISES - Hamstring Strain These exercises may help you when beginning to rehabilitate your injury. They may resolve your symptoms with or without further involvement from your physician, physical therapist or athletic trainer. While completing these exercises, remember:  Muscles can gain both the endurance and the strength needed for everyday activities through controlled exercises. Complete these exercises as instructed by your physician, physical therapist or athletic trainer. Increase the resistance and repetitions only as guided. You may experience muscle soreness or fatigue, but the pain or discomfort you are trying to eliminate should never get worse during these exercises. If this pain does get worse, stop and make certain you are following the directions exactly. If the pain is still present after adjustments, discontinue the exercise until you can discuss the trouble with your clinician. STRENGTH - Hip Extensors, Straight Leg Raises  Lie on your stomach on a firm surface. Tense the muscles in your buttocks to lift your right / left leg about 4 inches. If you cannot lift your leg this high without arching your back, place  a pillow under your hips. Keep your knee straight. Hold for __________ seconds. Slowly lower your leg to the starting position and allow it to relax completely before starting the next repetition. Repeat __________ times. Complete this exercise __________ times per day.  STRENGTH - Hamstring, Isometrics  Lie on your back on a firm surface. Bend your right / left knee approximately __________ degrees. Dig your heel into the surface, as if you are trying to pull it toward your buttocks. Tighten the muscles in the back of your thighs to "dig" as hard as you can, without increasing any pain. Hold this position for __________ seconds. Release the tension gradually and allow your muscles to completely relax for __________ seconds between each exercise. Repeat __________ times. Complete this exercise __________ times per day.  STRENGTH - Hamstring, Curls  Lay on your stomach with your legs extended. (If you lay on a bed, your feet may hang over the edge.) Tighten the muscles in the back of your thigh to bend your right / left knee up to 90 degrees. Keep your hips flat on the bed or floor. Hold this position for __________ seconds. Slowly lower your leg back to the starting position. Repeat __________ times. Complete this exercise __________ times per day.  OPTIONAL ANKLE WEIGHTS: Begin with ____________________, but DO NOT  exceed ____________________. Increase in 1 pound/0.5 kilogram increments.   This information is not intended to replace advice given to you by your health care provider. Make sure you discuss any questions you have with your health care provider.   Document Released: 09/27/2005 Document Revised: 10/18/2014 Document Reviewed: 01/09/2009 Elsevier Interactive Patient Education 2016 [employer]    Pain that worsens with use of the quadriceps muscles.  Muscle spasm in the thigh.  Difficulty with common tasks that involve the quadriceps muscle, such as walking.  A crackling  sound (crepitation) when the tendon is moved or touched.  Loss of fullness of the muscle or bulging within the area of muscle with complete rupture. CAUSES  A strain occurs when a force is placed on the muscle or tendon that is greater than it can withstand. Common mechanisms of injury include:  Repetitive strenuous use of the quadriceps muscles. This may be due to an increase in the intensity, frequency, or duration of exercise.  Direct trauma to the quadriceps muscles or tendons. RISK INCREASES WITH:  Activities that involve forceful contractions of the quadriceps muscles (jumping or sprinting).  Contact sports (soccer or football).  Poor strength and flexibility.  Failure to warm-up properly before activity.  Previous injury to the thigh or knee. PREVENTION  Warm up and stretch properly before activity.  Allow for adequate recovery between workouts.  Maintain physical fitness:  Strength, flexibility, and endurance.  Cardiovascular fitness.  Wear properly fitted and padded protective equipment. PROGNOSIS  If treated properly, then quadriceps muscles strains are usually curable within 6 weeks.  RELATED COMPLICATIONS   Prolonged healing time, if improperly treated or re-injured.  Recurrent symptoms that result in a chronic problem.  Recurrence of symptoms if activity is resumed too soon. TREATMENT  Treatment initially involves the use of ice and medication to help reduce pain and inflammation. The use of strengthening and stretching exercises may help reduce pain with activity. These exercises may be performed at home or with referral to a therapist. Crutches may be recommended to allow the muscle to rest until walking can be completed without limping. Surgery is rarely necessary for this injury, but may be considered if the injury involves a grade 3 strain, or if symptoms persist for greater than 3 months despite non-surgical (conservative) treatment.  MEDICATION  If  pain medication is necessary, then nonsteroidal anti-inflammatory medications, such as aspirin and ibuprofen, or other minor pain relievers, such as acetaminophen, are often recommended.  Do not take pain medication for 7 days before surgery.  Prescription pain relievers may be given if deemed necessary by your caregiver. Use only as directed and only as much as you need.  Ointments applied to the skin may be helpful.  Corticosteroid injections may be given by your caregiver. These injections should be reserved for the most serious cases, because they may only be given a certain number of times. HEAT AND COLD  Cold treatment (icing) relieves pain and reduces inflammation. Cold treatment should be applied for 10 to 15 minutes every 2 to 3 hours for inflammation and pain and immediately after any activity that aggravates your symptoms. Use ice packs or massage the area with a piece of ice (ice massage).  Heat treatment may be used prior to performing the stretching and strengthening activities prescribed by your caregiver, physical therapist, or athletic trainer. Use a heat pack or soak the injury in warm water. SEEK MEDICAL CARE IF:  Treatment seems to offer no benefit, or the condition worsens.  Any medications produce adverse side effects. EXERCISES  RANGE OF MOTION (ROM) AND STRETCHING EXERCISES - Quadriceps Strain These exercises may help you when beginning to rehabilitate your injury. Your symptoms may resolve with or without further involvement from your physician, physical therapist or athletic trainer. While completing these exercises, remember:   Restoring tissue flexibility helps normal motion to return to the joints. This allows healthier, less painful movement and activity.  An effective stretch should be held for at least 30 seconds.  A stretch should never be painful. You should only feel a gentle lengthening or release in the stretched tissue. RANGE OF MOTION - Knee Flexion,  Active  Lie on your back with both knees straight. (If this causes back discomfort, bend your opposite knee, placing your foot flat on the floor.)  Slowly slide your heel back toward your buttocks until you feel a gentle stretch in the front of your knee or thigh.  Hold for __________ seconds. Slowly slide your heel back to the starting position. Repeat __________ times. Complete this exercise __________ times per day.  STRETCH - Quadriceps, Prone  Lie on your stomach on a firm surface, such as a bed or padded floor.  Bend your right / left knee and grasp your ankle. If you are unable to reach, your ankle or pant leg, use a belt around your foot to lengthen your reach.  Gently pull your heel toward your buttocks. Your knee should not slide out to the side. You should feel a stretch in the front of your thigh and/or knee.  Hold this position for __________ seconds. Repeat __________ times. Complete this stretch __________ times per day.  STRETCHING - Hip Flexors, Lunge  Half kneel with your right / left knee on the floor and your opposite knee bent and directly over your ankle.  Keep good posture with your head over your shoulders. Tighten your buttocks to point your tailbone downward; this will prevent your back from arching too much.  You should feel a gentle stretch in the front of your thigh and/or hip. If you do not feel any resistance, slightly slide your opposite foot forward and then slowly lunge forward so your knee once again lines up over your ankle. Be sure your tailbone remains pointed downward.  Hold this stretch for __________ seconds. Repeat __________ times. Complete this stretch __________ times per day. STRENGTHENING EXERCISES - Quadriceps Strain These exercises may help you when beginning to rehabilitate your injury. They may resolve your symptoms with or without further involvement from your physician, physical therapist or athletic trainer. While completing these  exercises, remember:   Muscles can gain both the endurance and the strength needed for everyday activities through controlled exercises.  Complete these exercises as instructed by your physician, physical therapist or athletic trainer. Progress the resistance and repetitions only as guided. STRENGTH - Quadriceps, Isometrics  Lie on your back with your right / left leg extended and your opposite knee bent.  Gradually tense the muscles in the front of your right / left thigh. You should see either your knee cap slide up toward your hip or increased dimpling just above the knee. This motion will push the back of the knee down toward the floor/mat/bed on which you are lying.  Hold the muscle as tight as you can without increasing your pain for __________ seconds.  Relax the muscles slowly and completely in between each repetition. Repeat __________ times. Complete this exercise __________ times per day.  STRENGTH - Quadriceps, Short  Arcs   Lie on your back. Place a __________ inch towel roll under your knee so that the knee slightly bends.  Raise only your lower leg by tightening the muscles in the front of your thigh. Do not allow your thigh to rise.  Hold this position for __________ seconds. Repeat __________ times. Complete this exercise __________ times per day.  OPTIONAL ANKLE WEIGHTS: Begin with ____________________, but DO NOT exceed ____________________. Increase in1 lb/0.5 kg increments. STRENGTH - Quadriceps, Straight Leg Raises  Quality counts! Watch for signs that the quadriceps muscle is working to insure you are strengthening the correct muscles and not "cheating" by substituting with healthier muscles.  Lay on your back with your right / left leg extended and your opposite knee bent.  Tense the muscles in the front of your right / left thigh. You should see either your knee cap slide up or increased dimpling just above the knee. Your thigh may even quiver.  Tighten these  muscles even more and raise your leg 4 to 6 inches off the floor. Hold for __________ seconds.  Keeping these muscles tense, lower your leg.  Relax the muscles slowly and completely in between each repetition. Repeat __________ times. Complete this exercise __________ times per day.  STRENGTH - Quadriceps, Wall Slides  Follow guidelines for form closely. Increased knee pain often results from poorly placed feet or knees.  Lean against a smooth wall or door and walk your feet out 18-24 inches. Place your feet hip-width apart.  Slowly slide down the wall or door until your knees bend __________ degrees.* Keep your knees over your heels, not your toes, and in line with your hips, not falling to either side.  Hold for __________ seconds. Stand up to rest for __________ seconds in between each repetition. Repeat __________ times. Complete this exercise __________ times per day. * Your physician, physical therapist or athletic trainer will alter this angle based on your symptoms and progress. STRENGTH - Quadriceps, Step-Ups   Use a thick book, step or step stool that is __________ inches tall.  Holding a wall or counter for balance only, not support.  Slowly step-up with your right / left foot, keeping your knee in line with your hip and foot. Do not allow your knee to bend so far that you cannot see your toes.  Slowly unlock your knee and lower yourself to the starting position. Your muscles, not gravity, should lower you. Repeat __________ times. Complete this exercise __________ times per day.   This information is not intended to replace advice given to you by your health care provider. Make sure you discuss any questions you have with your health care provider.   Document Released: 09/27/2005 Document Revised: 02/11/2015 Document Reviewed: 01/09/2009 Elsevier Interactive Patient Education [employer]

## 2015-12-14 LAB — TOXASSURE SELECT,+ANTIDEPR,UR: PDF: 0

## 2015-12-16 ENCOUNTER — Encounter: Payer: Self-pay | Admitting: Physical Medicine & Rehabilitation

## 2015-12-18 ENCOUNTER — Ambulatory Visit: Payer: BLUE CROSS/BLUE SHIELD | Attending: Physical Medicine & Rehabilitation | Admitting: Psychology

## 2015-12-18 DIAGNOSIS — F068 Other specified mental disorders due to known physiological condition: Secondary | ICD-10-CM | POA: Diagnosis not present

## 2015-12-18 DIAGNOSIS — F419 Anxiety disorder, unspecified: Secondary | ICD-10-CM

## 2015-12-18 DIAGNOSIS — S069X0S Unspecified intracranial injury without loss of consciousness, sequela: Secondary | ICD-10-CM

## 2015-12-18 NOTE — Progress Notes (Signed)
**Note Wanda-Identified via Obfuscation** [facility]  [address]   Telephone [phone] [address] Fax [phone] [place], [place] [id number]   Psychology Progress Note   Name:  Wanda Warner Date of Birth:  16-Nov-1980 MRN:  [id number]  Date:12/18/2015 (633m) psychotherapy She reports much reduced anxiety and better spirits now that her fiancee's daughter is actually moving out. She continues to report irritability when in crowds or when tired. No new problems or concerns expressed.  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  Return in one month.   Gladstone PihMichael F. Saanvi Hakala, Ph.D Licensed Psychologist

## 2015-12-23 NOTE — Progress Notes (Signed)
Urine drug screen for this encounter is consistent for prescribed medication 

## 2016-01-15 ENCOUNTER — Ambulatory Visit: Payer: Medicare Other | Admitting: Psychology

## 2016-02-03 ENCOUNTER — Encounter: Payer: Self-pay | Admitting: Psychology

## 2016-02-03 ENCOUNTER — Ambulatory Visit: Payer: Medicare Other | Attending: Physical Medicine & Rehabilitation | Admitting: Psychology

## 2016-02-03 DIAGNOSIS — F419 Anxiety disorder, unspecified: Secondary | ICD-10-CM

## 2016-02-03 DIAGNOSIS — F068 Other specified mental disorders due to known physiological condition: Secondary | ICD-10-CM | POA: Diagnosis not present

## 2016-02-03 DIAGNOSIS — S069X0S Unspecified intracranial injury without loss of consciousness, sequela: Secondary | ICD-10-CM

## 2016-02-03 NOTE — Progress Notes (Signed)
[facility]  [address]   Telephone [phone] [address] Fax [phone] [place], [place] [address]   Psychology Progress Note   Name:  Wanda Warner Date of Birth:  [birth year] MRN:  [id number]  Date:02/03/2016 (103m psychotherapy No new problems or concerns reported. She reported propensity to often worry, lately mostly about financial issues and her fiancee's health. She reached out to a friend whom she used to work with but otherwise she has not contacted friends. She has not started an exercise routine on the treadmill as we discussed. She reported little sucess in reducing her cigarette smoking. She was down on herself (e.g., "I'm lazy") for not having met her goals.   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   Return in one month.   MJamey Ripa Ph.D Licensed Psychologist

## 2016-02-04 ENCOUNTER — Encounter: Payer: Medicare Other | Attending: Physical Medicine & Rehabilitation | Admitting: Physical Medicine & Rehabilitation

## 2016-02-04 ENCOUNTER — Encounter: Payer: Self-pay | Admitting: Physical Medicine & Rehabilitation

## 2016-02-04 VITALS — BP 121/79 | HR 72 | Resp 14

## 2016-02-04 DIAGNOSIS — M1990 Unspecified osteoarthritis, unspecified site: Secondary | ICD-10-CM | POA: Insufficient documentation

## 2016-02-04 DIAGNOSIS — M545 Low back pain, unspecified: Secondary | ICD-10-CM

## 2016-02-04 DIAGNOSIS — G44309 Post-traumatic headache, unspecified, not intractable: Secondary | ICD-10-CM

## 2016-02-04 DIAGNOSIS — S069X0S Unspecified intracranial injury without loss of consciousness, sequela: Secondary | ICD-10-CM

## 2016-02-04 DIAGNOSIS — F419 Anxiety disorder, unspecified: Secondary | ICD-10-CM | POA: Insufficient documentation

## 2016-02-04 DIAGNOSIS — S069X4S Unspecified intracranial injury with loss of consciousness of 6 hours to 24 hours, sequela: Secondary | ICD-10-CM | POA: Diagnosis not present

## 2016-02-04 DIAGNOSIS — F4000 Agoraphobia, unspecified: Secondary | ICD-10-CM | POA: Diagnosis not present

## 2016-02-04 DIAGNOSIS — G8929 Other chronic pain: Secondary | ICD-10-CM | POA: Diagnosis not present

## 2016-02-04 DIAGNOSIS — M4854XA Collapsed vertebra, not elsewhere classified, thoracic region, initial encounter for fracture: Secondary | ICD-10-CM | POA: Diagnosis not present

## 2016-02-04 DIAGNOSIS — R51 Headache: Secondary | ICD-10-CM | POA: Diagnosis not present

## 2016-02-04 DIAGNOSIS — F17210 Nicotine dependence, cigarettes, uncomplicated: Secondary | ICD-10-CM | POA: Diagnosis not present

## 2016-02-04 DIAGNOSIS — R430 Anosmia: Secondary | ICD-10-CM | POA: Diagnosis not present

## 2016-02-04 DIAGNOSIS — M25561 Pain in right knee: Secondary | ICD-10-CM | POA: Insufficient documentation

## 2016-02-04 DIAGNOSIS — Z87820 Personal history of traumatic brain injury: Secondary | ICD-10-CM | POA: Insufficient documentation

## 2016-02-04 DIAGNOSIS — F068 Other specified mental disorders due to known physiological condition: Secondary | ICD-10-CM | POA: Diagnosis not present

## 2016-02-04 DIAGNOSIS — Z79899 Other long term (current) drug therapy: Secondary | ICD-10-CM | POA: Insufficient documentation

## 2016-02-04 DIAGNOSIS — M25562 Pain in left knee: Secondary | ICD-10-CM | POA: Diagnosis not present

## 2016-02-04 MED ORDER — METHYLPHENIDATE HCL 10 MG PO TABS: 10.0000 mg | ORAL_TABLET | Freq: Three times a day (TID) | ORAL | Status: DC

## 2016-02-04 NOTE — Patient Instructions (Addendum)
PLEASE CALL ME WITH ANY PROBLEMS OR QUESTIONS [phone]).     YOU CAN PICK UP THE RITALIN PRESCRIPTION IN 2 MONTHS. CALL A WEEK PRIOR TO IT BEING DUE.

## 2016-02-04 NOTE — Progress Notes (Signed)
Subjective:    Patient ID: Wanda Warner, female    DOB: 02-Oct-1981, 35 y.o.   MRN: [id number]  HPI   Kathrynne is here in follow up of her TBI and associated deficits. She has had a little more pain with the cooler/damp weather, and it seems to have affected her sleep also. She hasn't been walking as much.   She is doing well emotionally for the most part. She finds ongoing counseling with Dr. Leonides Cave very helpful. She uses xanax and propranolol to help with her anxiety. Some days it's worse than others.   Ritalin continues to be effective for concentration and focus.   Pain Inventory Average Pain 5 Pain Right Now 5 My pain is sharp, burning, dull, stabbing and aching  In the last 24 hours, has pain interfered with the following? General activity 6 Relation with others 6 Enjoyment of life 6 What TIME of day is your pain at its worst? morning, daytime, evening  Sleep (in general) Fair  Pain is worse with: bending, sitting, inactivity and standing Pain improves with: heat/ice and medication Relief from Meds: 2  Mobility how many minutes can you walk? 2 ability to climb steps?  yes do you drive?  no Do you have any goals in this area?  yes  Function disabled: date disabled NA I need assistance with the following:  meal prep, household duties and shopping  Neuro/Psych anxiety loss of taste or smell  Prior Studies Any changes since last visit?  no  Physicians involved in your care Any changes since last visit?  no   Family History  Problem Relation Age of Onset  . Hypertension Father   . Diabetes Maternal Grandmother    Social History   Social History  . Marital Status: Single    Spouse Name: N/A  . Number of Children: N/A  . Years of Education: N/A   Social History Main Topics  . Smoking status: Current Every Day Smoker -- 0.05 packs/day for 10 years    Types: Cigarettes  . Smokeless tobacco: None  . Alcohol Use: No  . Drug Use: No  . Sexual Activity: Yes    Birth Control/ Protection: None   Other Topics Concern  . None   Social History Narrative   Lives in [place].    Hobbies: play solitaire and reading         Past Surgical History  Procedure Laterality Date  . Orthopedic surgery    . Induced abortion      10/26/11  . Craniotomy    . Fracture surgery    . Cranioplasty     Past Medical History  Diagnosis Date  . Traumatic brain injury (HCC) 07/28/13  . Headache   . Loss of smell   . Arthritis     Knees and back   . Thoracic compression fracture (HCC)   . Anxiety    BP 121/79 mmHg  Pulse 72  Resp 14  LMP 01/05/2016  Opioid Risk Score:   Fall Risk Score:  `1  Depression screen PHQ 2/9  Depression screen [facility] 2/9 12/09/2015 04/07/2015  Decreased Interest 2 2  Down, Depressed, Hopeless 1 1  PHQ - 2 Score 3 3  Altered sleeping - 1  Tired, decreased energy - 3  Change in appetite - 3  Feeling bad or failure about yourself  - 3  Trouble concentrating - 3  Moving slowly or fidgety/restless - 1  Suicidal thoughts - 0  PHQ-9 Score - 17  Review of Systems  Constitutional:       Loss of taste or smell  Psychiatric/Behavioral: The patient is nervous/anxious.   All other systems reviewed and are negative.      Objective:   Physical Exam  General: Alert and oriented x 3, No apparent distress. Wearing sun glasses again.  HEENT: Head is normocephalic, atraumatic, PERRLA, EOMI, sclera anicteric, oral mucosa pink and moist, dentition intact, ext ear canals clear.  Neck: Supple without JVD or lymphadenopathy  Heart: Reg rate and rhythm. No murmurs rubs or gallops  Chest: CTA bilaterally without wheezes, rales, or rhonchi; no distress  Abdomen: Soft, non-tender, non-distended, bowel sounds positive.  Extremities: No clubbing, cyanosis, or edema. Pulses are 2+  Skin: Clean and intact without signs of breakdown  Neuro: Strength nearly normal. Sensation intact. Attention and focus improving. Needs few cues to stay on  track with conversation.. She makes good eye contact with me. short term memory shows improvement. Recent memory better. .  Musculoskeletal:moving better. Low back still generally a little tender.  Psych: Pt's affect is pleasant and cooperative.    Assessment & Plan:   1. Hx of TBI (07/28/13) with persistent cognitive and behavioral deficits. Also, she's having ongoing headaches, occasional vertigo, anosmia. [place] SinkRita continues to demonstrate gradual improvement with attention/memory and behavior.  2. Hx of T6 compression fracture  3. Agoraphobia per psychology  4. Chronic low back pain.  5. Low back pain  6. Bilateral knee pain.    Plan:  1. Continue ritalin 15mg  bid. A second RF for next month was given.  2. Continue topamax 50mg   3. Reviewed the value of aerobic exercise/walking for her mood/pain. She is going to re-commit herself.  4. Continue counseling per Dr. Leonides CaveZelson which had remained helpful.  5. Continue propranol 20mg  TID---may begin weaning this summer.  6. xanax 0.5mg  prn 12hrs for anxiety #60. Continue to work on Pharmacologistcoping skills per psychology. We discussed coping and adjustment at length at length again.  7. Follow up with me in about 4 months. 30 minutes of face to face patient care time were spent during this visit. All questions were encouraged and answered. Pt may pick up new ritalin rx's in about 2 months time.

## 2016-02-09 ENCOUNTER — Telehealth: Payer: Self-pay | Admitting: Physical Medicine & Rehabilitation

## 2016-02-09 NOTE — Telephone Encounter (Signed)
Patient is needing a refill on Xanax.  Please call Vincenza HewsShane at [phone].

## 2016-02-10 MED ORDER — ALPRAZOLAM 0.5 MG PO TABS: 0.5000 mg | ORAL_TABLET | Freq: Two times a day (BID) | ORAL | Status: DC | PRN

## 2016-02-10 NOTE — Telephone Encounter (Signed)
Medication called to pharmacy and Vincenza HewsShane notified per message on VM per [facility]

## 2016-03-03 ENCOUNTER — Ambulatory Visit: Payer: Medicare Other | Admitting: Psychology

## 2016-03-11 ENCOUNTER — Encounter: Payer: Self-pay | Admitting: Psychology

## 2016-03-11 ENCOUNTER — Ambulatory Visit: Payer: Medicare Other | Attending: Physical Medicine & Rehabilitation | Admitting: Psychology

## 2016-03-11 DIAGNOSIS — F419 Anxiety disorder, unspecified: Secondary | ICD-10-CM

## 2016-03-11 DIAGNOSIS — S069X0S Unspecified intracranial injury without loss of consciousness, sequela: Secondary | ICD-10-CM

## 2016-03-11 DIAGNOSIS — F068 Other specified mental disorders due to known physiological condition: Secondary | ICD-10-CM

## 2016-03-11 NOTE — Progress Notes (Signed)
[facility]  [address]   Telephone [phone] [address] Fax [phone] [place], [place] [address]   Psychology Progress Note   Name:  De BlanchRita Maslow Date of Birth:  1981-04-08 MRN:  [id number]  Date:03/11/2016 (3623m) psychotherapy No new problems or concerns reported. Anxiety reported under good control. She went out socially with a friend and enjoyed herself. She reported that she been exercising more. She reported no success in reducing her cigarette smoking.   Diagnostic Impressions Anxiety disorder, unspecified [F41.91] Cognitive Deficits as a late effect of TBI [F06.8]  Gave her contact information for the [place] Smoking Cessation program. She will follow-up with making more social plans.  Return in one month.    Gladstone PihMichael F. Mahamud Metts, Ph.D Licensed Psychologist

## 2016-03-23 ENCOUNTER — Telehealth: Payer: Self-pay

## 2016-03-23 DIAGNOSIS — S069X0S Unspecified intracranial injury without loss of consciousness, sequela: Principal | ICD-10-CM

## 2016-03-23 DIAGNOSIS — F068 Other specified mental disorders due to known physiological condition: Secondary | ICD-10-CM

## 2016-03-23 NOTE — Telephone Encounter (Signed)
Pt was calling about status on the form that was dropped off. They would also like to pick up the Ritalin rx on Friday as well when the form will be completed. Please advise on refill?

## 2016-03-23 NOTE — Telephone Encounter (Signed)
i have no idea about the form which was dropped off. Does anyone else. Ritalin refill is fine

## 2016-03-24 MED ORDER — METHYLPHENIDATE HCL 10 MG PO TABS: 10.0000 mg | ORAL_TABLET | Freq: Three times a day (TID) | ORAL | Status: DC

## 2016-03-24 NOTE — Telephone Encounter (Signed)
Form has been located and placed on your desk. If refill is ok. We can take care of both today. Thank you.

## 2016-04-08 ENCOUNTER — Ambulatory Visit: Payer: Medicare Other | Admitting: Psychology

## 2016-04-27 ENCOUNTER — Telehealth: Payer: Self-pay | Admitting: Physical Medicine & Rehabilitation

## 2016-04-27 DIAGNOSIS — F068 Other specified mental disorders due to known physiological condition: Secondary | ICD-10-CM

## 2016-04-27 DIAGNOSIS — S069X0S Unspecified intracranial injury without loss of consciousness, sequela: Principal | ICD-10-CM

## 2016-04-27 NOTE — Telephone Encounter (Signed)
Patient needs a refill on Ritalin.  Please call Vincenza HewsShane when this is done.

## 2016-04-27 NOTE — Telephone Encounter (Signed)
Okay to refill? 

## 2016-04-27 NOTE — Telephone Encounter (Signed)
Yes she may have refills. thanks

## 2016-04-28 MED ORDER — METHYLPHENIDATE HCL 10 MG PO TABS: 10.0000 mg | ORAL_TABLET | Freq: Three times a day (TID) | ORAL | Status: DC

## 2016-04-28 NOTE — Telephone Encounter (Signed)
rx's printed, signed.Marland Kitchen.Marland Kitchen.Marland Kitchen.Marland Kitchen.patient called and made aware that scripts are available for p/u

## 2016-04-28 NOTE — Telephone Encounter (Signed)
Vincenza HewsShane is returning a call to this office [phone].

## 2016-04-30 ENCOUNTER — Telehealth: Payer: Self-pay | Admitting: Psychology

## 2016-04-30 NOTE — Telephone Encounter (Signed)
Since she does not have a scheduled appointment, I called Wanda Warner on 04/29/16 to let her know that I will be leaving my position as of 06/25/16. She made an appointment with me for 05/03/16. Will discuss options then.

## 2016-05-03 ENCOUNTER — Ambulatory Visit: Payer: Medicare Other | Attending: Physical Medicine & Rehabilitation | Admitting: Psychology

## 2016-05-03 DIAGNOSIS — F419 Anxiety disorder, unspecified: Secondary | ICD-10-CM

## 2016-05-03 NOTE — Progress Notes (Signed)
[facility]  [address]   Telephone [phone] [address] Fax [phone] [place], [place] [address]   Psychology Progress Note   Name:  Wanda Warner Date of Birth:  02/15/1981 MRN:  [id number]  Date:05/03/2016 (54m) psychotherapy She reported some increased feelings of loss due to her TBI as her 35th birthday approaches.   Diagnostic Impressions Anxiety disorder [F41.9]  Continue to encourage her to increase physical and social activities. She was given information about the local brain injury support group at her request.   I had informed her last week by phone of my plan to leave my position here as of 06/25/16. We discussed her options, which include being referred to another provider, continuing services with me at my new location in [place], [place] or terminating psychological services. She stated that she wishes to see me at my [facility] location.   Return on 05/31/16   Gladstone Pih, Ph.D Licensed Psychologist

## 2016-05-11 ENCOUNTER — Telehealth: Payer: Self-pay

## 2016-05-11 DIAGNOSIS — F068 Other specified mental disorders due to known physiological condition: Secondary | ICD-10-CM

## 2016-05-11 DIAGNOSIS — Z5181 Encounter for therapeutic drug level monitoring: Secondary | ICD-10-CM

## 2016-05-11 DIAGNOSIS — S069X0D Unspecified intracranial injury without loss of consciousness, subsequent encounter: Secondary | ICD-10-CM

## 2016-05-11 DIAGNOSIS — F411 Generalized anxiety disorder: Secondary | ICD-10-CM

## 2016-05-11 DIAGNOSIS — R4189 Other symptoms and signs involving cognitive functions and awareness: Secondary | ICD-10-CM

## 2016-05-11 DIAGNOSIS — S069X5S Unspecified intracranial injury with loss of consciousness greater than 24 hours with return to pre-existing conscious level, sequela: Secondary | ICD-10-CM

## 2016-05-11 DIAGNOSIS — R41840 Attention and concentration deficit: Secondary | ICD-10-CM

## 2016-05-11 DIAGNOSIS — S069X4S Unspecified intracranial injury with loss of consciousness of 6 hours to 24 hours, sequela: Secondary | ICD-10-CM

## 2016-05-11 DIAGNOSIS — S069X0S Unspecified intracranial injury without loss of consciousness, sequela: Secondary | ICD-10-CM

## 2016-05-11 DIAGNOSIS — Z79899 Other long term (current) drug therapy: Secondary | ICD-10-CM

## 2016-05-11 NOTE — Telephone Encounter (Signed)
Wanda Warner called on behalf of pt to refill topamax and propanolol. In the last OV note in April, you stated "Continue propranol 20mg  TID---may begin weaning this summer."   How would you like me to refill this?

## 2016-05-12 MED ORDER — TOPIRAMATE 50 MG PO TABS: 50.0000 mg | ORAL_TABLET | Freq: Every day | ORAL | 5 refills | Status: DC

## 2016-05-12 MED ORDER — PROPRANOLOL HCL 20 MG PO TABS: 20.0000 mg | ORAL_TABLET | Freq: Three times a day (TID) | ORAL | 3 refills | Status: DC

## 2016-05-12 NOTE — Telephone Encounter (Signed)
I resent the rx's to the [facility] on [address] per [facility] request. Vincenza Hews is aware.

## 2016-05-12 NOTE — Telephone Encounter (Signed)
I refilled both. We'll talk about changes at her next visit. thanks

## 2016-05-31 ENCOUNTER — Encounter: Payer: Self-pay | Admitting: Psychology

## 2016-05-31 ENCOUNTER — Ambulatory Visit: Payer: Medicare Other | Attending: Physical Medicine & Rehabilitation | Admitting: Psychology

## 2016-05-31 DIAGNOSIS — F419 Anxiety disorder, unspecified: Secondary | ICD-10-CM

## 2016-05-31 DIAGNOSIS — F068 Other specified mental disorders due to known physiological condition: Secondary | ICD-10-CM

## 2016-05-31 DIAGNOSIS — S069X0S Unspecified intracranial injury without loss of consciousness, sequela: Secondary | ICD-10-CM

## 2016-05-31 NOTE — Progress Notes (Signed)
**Note Wanda-Identified via Obfuscation** [facility]  [address]   Telephone [phone] [address] Fax [phone] [place], [place] [address]   Psychology Progress Note   Name:  Wanda Warner Date of Birth:  [birth year] MRN:  [id number]  Date:05/31/2016 (7121m) psychotherapy Georgiann HahnKat is doing well. She is now actively seeking out opportunities to socialize in contrast to previously needing a push to do this. She has also established a regular exercise routine. She reports increased energy level since she began to exercise regularly.    Diagnostic Impressions Anxiety disorder [F41.9] Cognitive deficit as late effect of TBI [F06.8]  She stated her intention to continue seeing me for counseling at [place] new [place].Advised her to call me in late September to schedule.   Gladstone PihMichael F. Tempestt Silba, Ph.D Licensed Psychologist

## 2016-06-07 ENCOUNTER — Encounter: Payer: Medicare Other | Admitting: Physical Medicine & Rehabilitation

## 2016-06-09 ENCOUNTER — Encounter: Payer: Medicare Other | Attending: Physical Medicine & Rehabilitation | Admitting: Physical Medicine & Rehabilitation

## 2016-06-09 ENCOUNTER — Encounter: Payer: Self-pay | Admitting: Physical Medicine & Rehabilitation

## 2016-06-09 VITALS — BP 115/80 | HR 81 | Resp 14

## 2016-06-09 DIAGNOSIS — Z87820 Personal history of traumatic brain injury: Secondary | ICD-10-CM | POA: Insufficient documentation

## 2016-06-09 DIAGNOSIS — Z79899 Other long term (current) drug therapy: Secondary | ICD-10-CM | POA: Insufficient documentation

## 2016-06-09 DIAGNOSIS — S069X5S Unspecified intracranial injury with loss of consciousness greater than 24 hours with return to pre-existing conscious level, sequela: Secondary | ICD-10-CM | POA: Diagnosis not present

## 2016-06-09 DIAGNOSIS — M25512 Pain in left shoulder: Secondary | ICD-10-CM | POA: Insufficient documentation

## 2016-06-09 DIAGNOSIS — S069X0S Unspecified intracranial injury without loss of consciousness, sequela: Secondary | ICD-10-CM | POA: Diagnosis not present

## 2016-06-09 DIAGNOSIS — Z5181 Encounter for therapeutic drug level monitoring: Secondary | ICD-10-CM | POA: Diagnosis not present

## 2016-06-09 DIAGNOSIS — G8929 Other chronic pain: Secondary | ICD-10-CM | POA: Insufficient documentation

## 2016-06-09 DIAGNOSIS — F068 Other specified mental disorders due to known physiological condition: Secondary | ICD-10-CM

## 2016-06-09 DIAGNOSIS — M545 Low back pain: Secondary | ICD-10-CM | POA: Insufficient documentation

## 2016-06-09 MED ORDER — ALPRAZOLAM 0.5 MG PO TABS: 0.5000 mg | ORAL_TABLET | Freq: Two times a day (BID) | ORAL | 3 refills | Status: DC | PRN

## 2016-06-09 MED ORDER — METHYLPHENIDATE HCL 10 MG PO TABS: 10.0000 mg | ORAL_TABLET | Freq: Three times a day (TID) | ORAL | 0 refills | Status: DC

## 2016-06-09 NOTE — Progress Notes (Signed)
Subjective:    Patient ID: Wanda Warner, female    DOB: 02/12/1981, 35 y.o.   MRN: [id number]  HPI   Wanda Warner is here in follow up of her TBI. She has been doing fairly well. Her left shoulder has been bothering her with overhead activities for the last 2 months. She often sleeps on the left side. She denies any shoulder problems before. She has tried some otc ointments without benefit.   From a cognitive standpoint she's doing well. Anxiety still is an issue at times, but much better overall. She remains on xanax.   Pain Inventory Average Pain 5 Pain Right Now 5 My pain is sharp, burning, dull, stabbing and aching  In the last 24 hours, has pain interfered with the following? General activity 6 Relation with others 3 Enjoyment of life 5 What TIME of day is your pain at its worst? evening Sleep (in general) Fair  Pain is worse with: walking, bending, inactivity, standing and some activites Pain improves with: no pain med Relief from Meds: no pain med  Mobility walk without assistance  Function disabled: date disabled .  Neuro/Psych anxiety loss of taste or smell  Prior Studies Any changes since last visit?  no  Physicians involved in your care Any changes since last visit?  no   Family History  Problem Relation Age of Onset  . Hypertension Father   . Diabetes Maternal Grandmother    Social History   Social History  . Marital status: Single    Spouse name: N/A  . Number of children: N/A  . Years of education: N/A   Social History Main Topics  . Smoking status: Current Every Day Smoker    Packs/day: 0.05    Years: 10.00    Types: Cigarettes  . Smokeless tobacco: Never Used  . Alcohol use No  . Drug use: No  . Sexual activity: Yes    Birth control/ protection: None   Other Topics Concern  . None   Social History Narrative   Lives in [place].    Hobbies: play solitaire and reading         Past Surgical History:  Procedure Laterality Date  .  CRANIOPLASTY    . CRANIOTOMY    . FRACTURE SURGERY    . INDUCED ABORTION     10/26/11  . ORTHOPEDIC SURGERY     Past Medical History:  Diagnosis Date  . Anxiety   . Arthritis    Knees and back   . Headache   . Loss of smell   . Thoracic compression fracture (HCC)   . Traumatic brain injury (HCC) 07/28/13   BP 115/80 (BP Location: Right Arm, Patient Position: Sitting, Cuff Size: Normal)   Pulse 81   Resp 14   SpO2 99%   Opioid Risk Score:   Fall Risk Score:  `1  Depression screen PHQ 2/9  Depression screen [facility] 2/9 06/09/2016 12/09/2015 04/07/2015  Decreased Interest 0 2 2  Down, Depressed, Hopeless 0 1 1  PHQ - 2 Score 0 3 3  Altered sleeping - - 1  Tired, decreased energy - - 3  Change in appetite - - 3  Feeling bad or failure about yourself  - - 3  Trouble concentrating - - 3  Moving slowly or fidgety/restless - - 1  Suicidal thoughts - - 0  PHQ-9 Score - - 17    Review of Systems  All other systems reviewed and are negative.  Objective:   Physical Exam  General: Alert and oriented x 3, No apparent distress. Wearing sun glasses again.  HEENT: Head is normocephalic, atraumatic, PERRLA, EOMI, sclera anicteric, oral mucosa pink and moist, dentition intact, ext ear canals clear.  Neck: Supple without JVD or lymphadenopathy  Heart: Reg rate and rhythm. No murmurs rubs or gallops  Chest: CTA bilaterally without wheezes, rales, or rhonchi; no distress  Abdomen: Soft, non-tender, non-distended, bowel sounds positive.  Extremities: No clubbing, cyanosis, or edema. Pulses are 2+  Skin: Clean and intact without signs of breakdown  Neuro: Strength nearly normal. Sensation intact. Attention and focus improving. Needs few cues to stay on track with conversation.. She makes good eye contact with me. short term memory much improved.   Musculoskeletal: left shoulder maneuvers negative Psych: Pt's affect is pleasant and cooperative.    Assessment & Plan:   1. Hx of TBI  (07/28/13) with persistent cognitive and behavioral deficits. Also, she's having ongoing headaches, occasional vertigo, anosmia. [name] continues to demonstrate gradual improvement with attention/memory and behavior.  2. Hx of T6 compression fracture  3. Agoraphobia per psychology  4. Chronic low back pain.  5. Low back pain  6. 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 Continue ritalin 15mg  bid. A second RF for next month was given.  2. Continue topamax 50mg   3. Reviewed the value of aerobic exercise/walking for her mood/pain. She is going to re-commit herself.  4. Continue counseling per Dr. Leonides CaveZelson which had remained helpful.  5. Continue propranol 20mg  TID---may begin weaning this summer.  6. xanax 0.5mg  prn 12hrs for anxiety #60. Continue to work on Pharmacologistcoping skills per psychology. We discussed coping and adjustment at length at length again.  7. Follow up with me in about 4 months. 30 minutes of face to face patient care time were spent during this visit. All questions were encouraged and answered. Pt may pick up new ritalin rx's in about 2 months time.

## 2016-06-09 NOTE — Patient Instructions (Addendum)
PLEASE CALL ME WITH ANY PROBLEMS OR QUESTIONS ([phone])    Impingement Syndrome, Rotator Cuff, Bursitis With Rehab Impingement syndrome is a condition that involves inflammation of the tendons of the rotator cuff and the subacromial bursa, that causes pain in the shoulder. The rotator cuff consists of four tendons and muscles that control much of the shoulder and upper arm function. The subacromial bursa is a fluid filled sac that helps reduce friction between the rotator cuff and one of the bones of the shoulder (acromion). Impingement syndrome is usually an overuse injury that causes swelling of the bursa (bursitis), swelling of the tendon (tendonitis), and/or a tear of the tendon (strain). Strains are classified into three categories. Grade 1 strains cause pain, but the tendon is not lengthened. Grade 2 strains include a lengthened ligament, due to the ligament being stretched or partially ruptured. With grade 2 strains there is still function, although the function may be decreased. Grade 3 strains include a complete tear of the tendon or muscle, and function is usually impaired. SYMPTOMS   Pain around the shoulder, often at the outer portion of the upper arm.  Pain that gets worse with shoulder function, especially when reaching overhead or lifting.  Sometimes, aching when not using the arm.  Pain that wakes you up at night.  Sometimes, tenderness, swelling, warmth, or redness over the affected area.  Loss of strength.  Limited motion of the shoulder, especially reaching behind the back (to the back pocket or to unhook bra) or across your body.  Crackling sound (crepitation) when moving the arm.  Biceps tendon pain and inflammation (in the front of the shoulder). Worse when bending the elbow or lifting. CAUSES  Impingement syndrome is often an overuse injury, in which chronic (repetitive) motions cause the tendons or bursa to become inflamed. A strain occurs when a force is paced  on the tendon or muscle that is greater than it can withstand. Common mechanisms of injury include: Stress from sudden increase in duration, frequency, or intensity of training.  Direct hit (trauma) to the shoulder.  Aging, erosion of the tendon with normal use.  Bony bump on shoulder (acromial spur). RISK INCREASES WITH:  Contact sports (football, wrestling, boxing).  Throwing sports (baseball, tennis, volleyball).  Weightlifting and bodybuilding.  Heavy labor.  Previous injury to the rotator cuff, including impingement.  Poor shoulder strength and flexibility.  Failure to warm up properly before activity.  Inadequate protective equipment.  Old age.  Bony bump on shoulder (acromial spur). PREVENTION   Warm up and stretch properly before activity.  Allow for adequate recovery between workouts.  Maintain physical fitness:  Strength, flexibility, and endurance.  Cardiovascular fitness.  Learn and use proper exercise technique. PROGNOSIS  If treated properly, impingement syndrome usually goes away within 6 weeks. Sometimes surgery is required.  RELATED COMPLICATIONS   Longer healing time if not properly treated, or if not given enough time to heal.  Recurring symptoms, that result in a chronic condition.  Shoulder stiffness, frozen shoulder, or loss of motion.  Rotator cuff tendon tear.  Recurring symptoms, especially if activity is resumed too soon, with overuse, with a direct blow, or when using poor technique. TREATMENT  Treatment first involves the use of ice and medicine, to reduce pain and inflammation. The use of strengthening and stretching exercises may help reduce pain with activity. These exercises may be performed at home or with a therapist. If non-surgical treatment is unsuccessful after more than 6 months, surgery may be   advised. After surgery and rehabilitation, activity is usually possible in 3 months.  MEDICATION  If pain medicine is needed,  nonsteroidal anti-inflammatory medicines (aspirin and ibuprofen), or other minor pain relievers (acetaminophen), are often advised.  Do not take pain medicine for 7 days before surgery.  Prescription pain relievers may be given, if your caregiver thinks they are needed. Use only as directed and only as much as you need.  Corticosteroid injections may be given by your caregiver. These injections should be reserved for the most serious cases, because they may only be given a certain number of times. HEAT AND COLD  Cold treatment (icing) should be applied for 10 to 15 minutes every 2 to 3 hours for inflammation and pain, and immediately after activity that aggravates your symptoms. Use ice packs or an ice massage.  Heat treatment may be used before performing stretching and strengthening activities prescribed by your caregiver, physical therapist, or athletic trainer. Use a heat pack or a warm water soak. SEEK MEDICAL CARE IF:   Symptoms get worse or do not improve in 4 to 6 weeks, despite treatment.  New, unexplained symptoms develop. (Drugs used in treatment may produce side effects.) EXERCISES  RANGE OF MOTION (ROM) AND STRETCHING EXERCISES - Impingement Syndrome (Rotator Cuff  Tendinitis, Bursitis) These exercises may help you when beginning to rehabilitate your injury. Your symptoms may go away with or without further involvement from your physician, physical therapist or athletic trainer. While completing these exercises, remember:   Restoring tissue flexibility helps normal motion to return to the joints. This allows healthier, less painful movement and activity.  An effective stretch should be held for at least 30 seconds.  A stretch should never be painful. You should only feel a gentle lengthening or release in the stretched tissue. STRETCH - Flexion, Standing  Stand with good posture. With an underhand grip on your right / left hand, and an overhand grip on the opposite hand,  grasp a broomstick or cane so that your hands are a little more than shoulder width apart.  Keeping your right / left elbow straight and shoulder muscles relaxed, push the stick with your opposite hand, to raise your right / left arm in front of your body and then overhead. Raise your arm until you feel a stretch in your right / left shoulder, but before you have increased shoulder pain.  Try to avoid shrugging your right / left shoulder as your arm rises, by keeping your shoulder blade tucked down and toward your mid-back spine. Hold for __________ seconds.  Slowly return to the starting position. Repeat __________ times. Complete this exercise __________ times per day. STRETCH - Abduction, Supine  Lie on your back. With an underhand grip on your right / left hand and an overhand grip on the opposite hand, grasp a broomstick or cane so that your hands are a little more than shoulder width apart.  Keeping your right / left elbow straight and your shoulder muscles relaxed, push the stick with your opposite hand, to raise your right / left arm out to the side of your body and then overhead. Raise your arm until you feel a stretch in your right / left shoulder, but before you have increased shoulder pain.  Try to avoid shrugging your right / left shoulder as your arm rises, by keeping your shoulder blade tucked down and toward your mid-back spine. Hold for __________ seconds.  Slowly return to the starting position. Repeat __________ times. Complete this exercise   __________ times per day. ROM - Flexion, Active-Assisted  Lie on your back. You may bend your knees for comfort.  Grasp a broomstick or cane so your hands are about shoulder width apart. Your right / left hand should grip the end of the stick, so that your hand is positioned "thumbs-up," as if you were about to shake hands.  Using your healthy arm to lead, raise your right / left arm overhead, until you feel a gentle stretch in your  shoulder. Hold for __________ seconds.  Use the stick to assist in returning your right / left arm to its starting position. Repeat __________ times. Complete this exercise __________ times per day.  ROM - Internal Rotation, Supine   Lie on your back on a firm surface. Place your right / left elbow about 60 degrees away from your side. Elevate your elbow with a folded towel, so that the elbow and shoulder are the same height.  Using a broomstick or cane and your strong arm, pull your right / left hand toward your body until you feel a gentle stretch, but no increase in your shoulder pain. Keep your shoulder and elbow in place throughout the exercise.  Hold for __________ seconds. Slowly return to the starting position. Repeat __________ times. Complete this exercise __________ times per day. STRETCH - Internal Rotation  Place your right / left hand behind your back, palm up.  Throw a towel or belt over your opposite shoulder. Grasp the towel with your right / left hand.  While keeping an upright posture, gently pull up on the towel, until you feel a stretch in the front of your right / left shoulder.  Avoid shrugging your right / left shoulder as your arm rises, by keeping your shoulder blade tucked down and toward your mid-back spine.  Hold for __________ seconds. Release the stretch, by lowering your healthy hand. Repeat __________ times. Complete this exercise __________ times per day. ROM - Internal Rotation   Using an underhand grip, grasp a stick behind your back with both hands.  While standing upright with good posture, slide the stick up your back until you feel a mild stretch in the front of your shoulder.  Hold for __________ seconds. Slowly return to your starting position. Repeat __________ times. Complete this exercise __________ times per day.  STRETCH - Posterior Shoulder Capsule   Stand or sit with good posture. Grasp your right / left elbow and draw it across your  chest, keeping it at the same height as your shoulder.  Pull your elbow, so your upper arm comes in closer to your chest. Pull until you feel a gentle stretch in the back of your shoulder.  Hold for __________ seconds. Repeat __________ times. Complete this exercise __________ times per day. STRENGTHENING EXERCISES - Impingement Syndrome (Rotator Cuff Tendinitis, Bursitis) These exercises may help you when beginning to rehabilitate your injury. They may resolve your symptoms with or without further involvement from your physician, physical therapist or athletic trainer. While completing these exercises, remember:  Muscles can gain both the endurance and the strength needed for everyday activities through controlled exercises.  Complete these exercises as instructed by your physician, physical therapist or athletic trainer. Increase the resistance and repetitions only as guided.  You may experience muscle soreness or fatigue, but the pain or discomfort you are trying to eliminate should never worsen during these exercises. If this pain does get worse, stop and make sure you are following the directions exactly. If the   pain is still present after adjustments, discontinue the exercise until you can discuss the trouble with your clinician.  During your recovery, avoid activity or exercises which involve actions that place your injured hand or elbow above your head or behind your back or head. These positions stress the tissues which you are trying to heal. STRENGTH - Scapular Depression and Adduction   With good posture, sit on a firm chair. Support your arms in front of you, with pillows, arm rests, or on a table top. Have your elbows in line with the sides of your body.  Gently draw your shoulder blades down and toward your mid-back spine. Gradually increase the tension, without tensing the muscles along the top of your shoulders and the back of your neck.  Hold for __________ seconds. Slowly  release the tension and relax your muscles completely before starting the next repetition.  After you have practiced this exercise, remove the arm support and complete the exercise in standing as well as sitting position. Repeat __________ times. Complete this exercise __________ times per day.  STRENGTH - Shoulder Abductors, Isometric  With good posture, stand or sit about 4-6 inches from a wall, with your right / left side facing the wall.  Bend your right / left elbow. Gently press your right / left elbow into the wall. Increase the pressure gradually, until you are pressing as hard as you can, without shrugging your shoulder or increasing any shoulder discomfort.  Hold for __________ seconds.  Release the tension slowly. Relax your shoulder muscles completely before you begin the next repetition. Repeat __________ times. Complete this exercise __________ times per day.  STRENGTH - External Rotators, Isometric  Keep your right / left elbow at your side and bend it 90 degrees.  Step into a door frame so that the outside of your right / left wrist can press against the door frame without your upper arm leaving your side.  Gently press your right / left wrist into the door frame, as if you were trying to swing the back of your hand away from your stomach. Gradually increase the tension, until you are pressing as hard as you can, without shrugging your shoulder or increasing any shoulder discomfort.  Hold for __________ seconds.  Release the tension slowly. Relax your shoulder muscles completely before you begin the next repetition. Repeat __________ times. Complete this exercise __________ times per day.  STRENGTH - Supraspinatus   Stand or sit with good posture. Grasp a __________ weight, or an exercise band or tubing, so that your hand is "thumbs-up," like you are shaking hands.  Slowly lift your right / left arm in a "V" away from your thigh, diagonally into the space between your  side and straight ahead. Lift your hand to shoulder height or as far as you can, without increasing any shoulder pain. At first, many people do not lift their hands above shoulder height.  Avoid shrugging your right / left shoulder as your arm rises, by keeping your shoulder blade tucked down and toward your mid-back spine.  Hold for __________ seconds. Control the descent of your hand, as you slowly return to your starting position. Repeat __________ times. Complete this exercise __________ times per day.  STRENGTH - External Rotators  Secure a rubber exercise band or tubing to a fixed object (table, pole) so that it is at the same height as your right / left elbow when you are standing or sitting on a firm surface.  Stand or sit   so that the secured exercise band is at your uninjured side.  Bend your right / left elbow 90 degrees. Place a folded towel or small pillow under your right / left arm, so that your elbow is a few inches away from your side.  Keeping the tension on the exercise band, pull it away from your body, as if pivoting on your elbow. Be sure to keep your body steady, so that the movement is coming only from your rotating shoulder.  Hold for __________ seconds. Release the tension in a controlled manner, as you return to the starting position. Repeat __________ times. Complete this exercise __________ times per day.  STRENGTH - Internal Rotators   Secure a rubber exercise band or tubing to a fixed object (table, pole) so that it is at the same height as your right / left elbow when you are standing or sitting on a firm surface.  Stand or sit so that the secured exercise band is at your right / left side.  Bend your elbow 90 degrees. Place a folded towel or small pillow under your right / left arm so that your elbow is a few inches away from your side.  Keeping the tension on the exercise band, pull it across your body, toward your stomach. Be sure to keep your body steady,  so that the movement is coming only from your rotating shoulder.  Hold for __________ seconds. Release the tension in a controlled manner, as you return to the starting position. Repeat __________ times. Complete this exercise __________ times per day.  STRENGTH - Scapular Protractors, Standing   Stand arms length away from a wall. Place your hands on the wall, keeping your elbows straight.  Begin by dropping your shoulder blades down and toward your mid-back spine.  To strengthen your protractors, keep your shoulder blades down, but slide them forward on your rib cage. It will feel as if you are lifting the back of your rib cage away from the wall. This is a subtle motion and can be challenging to complete. Ask your caregiver for further instruction, if you are not sure you are doing the exercise correctly.  Hold for __________ seconds. Slowly return to the starting position, resting the muscles completely before starting the next repetition. Repeat __________ times. Complete this exercise __________ times per day. STRENGTH - Scapular Protractors, Supine  Lie on your back on a firm surface. Extend your right / left arm straight into the air while holding a __________ weight in your hand.  Keeping your head and back in place, lift your shoulder off the floor.  Hold for __________ seconds. Slowly return to the starting position, and allow your muscles to relax completely before starting the next repetition. Repeat __________ times. Complete this exercise __________ times per day. STRENGTH - Scapular Protractors, Quadruped  Get onto your hands and knees, with your shoulders directly over your hands (or as close as you can be, comfortably).  Keeping your elbows locked, lift the back of your rib cage up into your shoulder blades, so your mid-back rounds out. Keep your neck muscles relaxed.  Hold this position for __________ seconds. Slowly return to the starting position and allow your  muscles to relax completely before starting the next repetition. Repeat __________ times. Complete this exercise __________ times per day.  STRENGTH - Scapular Retractors  Secure a rubber exercise band or tubing to a fixed object (table, pole), so that it is at the height of your shoulders when you   are either standing, or sitting on a firm armless chair.  With a palm down grip, grasp an end of the band in each hand. Straighten your elbows and lift your hands straight in front of you, at shoulder height. Step back, away from the secured end of the band, until it becomes tense.  Squeezing your shoulder blades together, draw your elbows back toward your sides, as you bend them. Keep your upper arms lifted away from your body throughout the exercise.  Hold for __________ seconds. Slowly ease the tension on the band, as you reverse the directions and return to the starting position. Repeat __________ times. Complete this exercise __________ times per day. STRENGTH - Shoulder Extensors   Secure a rubber exercise band or tubing to a fixed object (table, pole) so that it is at the height of your shoulders when you are either standing, or sitting on a firm armless chair.  With a thumbs-up grip, grasp an end of the band in each hand. Straighten your elbows and lift your hands straight in front of you, at shoulder height. Step back, away from the secured end of the band, until it becomes tense.  Squeezing your shoulder blades together, pull your hands down to the sides of your thighs. Do not allow your hands to go behind you.  Hold for __________ seconds. Slowly ease the tension on the band, as you reverse the directions and return to the starting position. Repeat __________ times. Complete this exercise __________ times per day.  STRENGTH - Scapular Retractors and External Rotators   Secure a rubber exercise band or tubing to a fixed object (table, pole) so that it is at the height as your shoulders,  when you are either standing, or sitting on a firm armless chair.  With a palm down grip, grasp an end of the band in each hand. Bend your elbows 90 degrees and lift your elbows to shoulder height, at your sides. Step back, away from the secured end of the band, until it becomes tense.  Squeezing your shoulder blades together, rotate your shoulders so that your upper arms and elbows remain stationary, but your fists travel upward to head height.  Hold for __________ seconds. Slowly ease the tension on the band, as you reverse the directions and return to the starting position. Repeat __________ times. Complete this exercise __________ times per day.  STRENGTH - Scapular Retractors and External Rotators, Rowing   Secure a rubber exercise band or tubing to a fixed object (table, pole) so that it is at the height of your shoulders, when you are either standing, or sitting on a firm armless chair.  With a palm down grip, grasp an end of the band in each hand. Straighten your elbows and lift your hands straight in front of you, at shoulder height. Step back, away from the secured end of the band, until it becomes tense.  Step 1: Squeeze your shoulder blades together. Bending your elbows, draw your hands to your chest, as if you are rowing a boat. At the end of this motion, your hands and elbow should be at shoulder height and your elbows should be out to your sides.  Step 2: Rotate your shoulders, to raise your hands above your head. Your forearms should be vertical and your upper arms should be horizontal.  Hold for __________ seconds. Slowly ease the tension on the band, as you reverse the directions and return to the starting position. Repeat __________ times. Complete this exercise __________   times per day.  STRENGTH - Scapular Depressors  Find a sturdy chair without wheels, such as a dining room chair.  Keeping your feet on the floor, and your hands on the chair arms, lift your bottom up from  the seat, and lock your elbows.  Keeping your elbows straight, allow gravity to pull your body weight down. Your shoulders will rise toward your ears.  Raise your body against gravity by drawing your shoulder blades down your back, shortening the distance between your shoulders and ears. Although your feet should always maintain contact with the floor, your feet should progressively support less body weight, as you get stronger.  Hold for __________ seconds. In a controlled and slow manner, lower your body weight to begin the next repetition. Repeat __________ times. Complete this exercise __________ times per day.    This information is not intended to replace advice given to you by your health care provider. Make sure you discuss any questions you have with your health care provider.   Document Released: 09/27/2005 Document Revised: 10/18/2014 Document Reviewed: 01/09/2009 [employer] Interactive Patient Education 2016 [employer]   

## 2016-06-19 LAB — TOXASSURE SELECT,+ANTIDEPR,UR

## 2016-06-22 NOTE — Progress Notes (Signed)
Urine drug screen for this encounter is consistent for prescribed medications.   

## 2016-08-16 ENCOUNTER — Telehealth: Payer: Self-pay | Admitting: Physical Medicine & Rehabilitation

## 2016-08-16 DIAGNOSIS — F068 Other specified mental disorders due to known physiological condition: Secondary | ICD-10-CM

## 2016-08-16 DIAGNOSIS — S069X0S Unspecified intracranial injury without loss of consciousness, sequela: Principal | ICD-10-CM

## 2016-08-16 MED ORDER — METHYLPHENIDATE HCL 10 MG PO TABS: 10.0000 mg | ORAL_TABLET | Freq: Three times a day (TID) | ORAL | 0 refills | Status: DC

## 2016-08-16 NOTE — Telephone Encounter (Signed)
RXs printed for Dr Riley KillSwartz to sign.  Appt is 10/14/15  Call tomorrow when signed

## 2016-08-16 NOTE — Telephone Encounter (Signed)
Patient needs a refill on Ritalin. °

## 2016-08-17 NOTE — Telephone Encounter (Signed)
Contacted pt and informed that scripts are ready for pick up

## 2016-08-26 DIAGNOSIS — F0280 Dementia in other diseases classified elsewhere without behavioral disturbance: Secondary | ICD-10-CM | POA: Diagnosis not present

## 2016-08-26 DIAGNOSIS — F419 Anxiety disorder, unspecified: Secondary | ICD-10-CM | POA: Diagnosis not present

## 2016-10-13 ENCOUNTER — Encounter: Payer: Medicare Other | Attending: Physical Medicine & Rehabilitation | Admitting: Physical Medicine & Rehabilitation

## 2016-10-13 ENCOUNTER — Encounter: Payer: Self-pay | Admitting: Physical Medicine & Rehabilitation

## 2016-10-13 VITALS — BP 118/80 | HR 67

## 2016-10-13 DIAGNOSIS — Z9889 Other specified postprocedural states: Secondary | ICD-10-CM | POA: Insufficient documentation

## 2016-10-13 DIAGNOSIS — S22059D Unspecified fracture of T5-T6 vertebra, subsequent encounter for fracture with routine healing: Secondary | ICD-10-CM | POA: Diagnosis not present

## 2016-10-13 DIAGNOSIS — Z5181 Encounter for therapeutic drug level monitoring: Secondary | ICD-10-CM

## 2016-10-13 DIAGNOSIS — G8929 Other chronic pain: Secondary | ICD-10-CM | POA: Diagnosis not present

## 2016-10-13 DIAGNOSIS — Z79899 Other long term (current) drug therapy: Secondary | ICD-10-CM | POA: Diagnosis not present

## 2016-10-13 DIAGNOSIS — R51 Headache: Secondary | ICD-10-CM | POA: Diagnosis not present

## 2016-10-13 DIAGNOSIS — S069X0S Unspecified intracranial injury without loss of consciousness, sequela: Secondary | ICD-10-CM | POA: Diagnosis not present

## 2016-10-13 DIAGNOSIS — F17210 Nicotine dependence, cigarettes, uncomplicated: Secondary | ICD-10-CM | POA: Diagnosis not present

## 2016-10-13 DIAGNOSIS — Z833 Family history of diabetes mellitus: Secondary | ICD-10-CM | POA: Insufficient documentation

## 2016-10-13 DIAGNOSIS — R42 Dizziness and giddiness: Secondary | ICD-10-CM | POA: Insufficient documentation

## 2016-10-13 DIAGNOSIS — X58XXXD Exposure to other specified factors, subsequent encounter: Secondary | ICD-10-CM | POA: Insufficient documentation

## 2016-10-13 DIAGNOSIS — M25512 Pain in left shoulder: Secondary | ICD-10-CM | POA: Insufficient documentation

## 2016-10-13 DIAGNOSIS — F068 Other specified mental disorders due to known physiological condition: Secondary | ICD-10-CM

## 2016-10-13 DIAGNOSIS — Z87820 Personal history of traumatic brain injury: Secondary | ICD-10-CM | POA: Insufficient documentation

## 2016-10-13 DIAGNOSIS — M545 Low back pain: Secondary | ICD-10-CM | POA: Diagnosis not present

## 2016-10-13 DIAGNOSIS — F419 Anxiety disorder, unspecified: Secondary | ICD-10-CM | POA: Diagnosis not present

## 2016-10-13 DIAGNOSIS — F4000 Agoraphobia, unspecified: Secondary | ICD-10-CM | POA: Insufficient documentation

## 2016-10-13 DIAGNOSIS — Z8249 Family history of ischemic heart disease and other diseases of the circulatory system: Secondary | ICD-10-CM | POA: Insufficient documentation

## 2016-10-13 DIAGNOSIS — F411 Generalized anxiety disorder: Secondary | ICD-10-CM

## 2016-10-13 DIAGNOSIS — R430 Anosmia: Secondary | ICD-10-CM | POA: Insufficient documentation

## 2016-10-13 DIAGNOSIS — S069XAS Unspecified intracranial injury with loss of consciousness status unknown, sequela: Secondary | ICD-10-CM

## 2016-10-13 DIAGNOSIS — G44329 Chronic post-traumatic headache, not intractable: Secondary | ICD-10-CM

## 2016-10-13 MED ORDER — PROPRANOLOL HCL 20 MG PO TABS: 20.0000 mg | ORAL_TABLET | Freq: Three times a day (TID) | ORAL | 3 refills | Status: DC

## 2016-10-13 MED ORDER — CLONAZEPAM 0.5 MG PO TABS: 0.5000 mg | ORAL_TABLET | Freq: Two times a day (BID) | ORAL | 3 refills | Status: DC

## 2016-10-13 MED ORDER — METHYLPHENIDATE HCL 10 MG PO TABS: 10.0000 mg | ORAL_TABLET | Freq: Three times a day (TID) | ORAL | 0 refills | Status: DC

## 2016-10-13 MED ORDER — ALPRAZOLAM 0.5 MG PO TABS: 0.5000 mg | ORAL_TABLET | Freq: Two times a day (BID) | ORAL | 3 refills | Status: DC | PRN

## 2016-10-13 NOTE — Progress Notes (Signed)
**Note Wanda-Identified via Obfuscation** Subjective:    Patient ID: Wanda Warner, female    DOB: 1980/10/15, 36 y.o.   MRN: [id number]  HPI    Wanda Warner is here in follow up of her TBI. She has been more stressed because her husband has been dealing with cardiac issues. She can't get to sleep at night or settle down. Her xanax doesn't seem to help much and in fact makes her feel a little more wired. She continues to see Dr. Leonides CaveZelson for coping skills which helps. She's trying to be better with her coping skills.   She wants to get regular with her exercise regimen. She has not been able to initiate it on her own mostly because she feels tired.  She asked that I help her start a program.   Her focus and concentration have been better in general. Headaches appear somewhat improved.   Bowel and bladder are functioning normally.    Pain Inventory Average Pain 7 Pain Right Now 5 My pain is sharp, dull, stabbing and aching  In the last 24 hours, has pain interfered with the following? General activity 4 Relation with others 4 Enjoyment of life 5 What TIME of day is your pain at its worst? all Sleep (in general) Poor  Pain is worse with: bending, sitting, inactivity, standing and some activites Pain improves with: heat/ice Relief from Meds: .  Mobility walk without assistance Do you have any goals in this area?  yes  Function disabled: date disabled . I need assistance with the following:  meal prep, household duties and shopping  Neuro/Psych anxiety loss of taste or smell  Prior Studies Any changes since last visit?  no  Physicians involved in your care Any changes since last visit?  no   Family History  Problem Relation Age of Onset  . Hypertension Father   . Diabetes Maternal Grandmother    Social History   Social History  . Marital status: Single    Spouse name: N/A  . Number of children: N/A  . Years of education: N/A   Social History Main Topics  . Smoking status: Current Every Day Smoker   Packs/day: 0.05    Years: 10.00    Types: Cigarettes  . Smokeless tobacco: Never Used  . Alcohol use No  . Drug use: No  . Sexual activity: Yes    Birth control/ protection: None   Other Topics Concern  . Not on file   Social History Narrative   Lives in [place].    Hobbies: play solitaire and reading         Past Surgical History:  Procedure Laterality Date  . CRANIOPLASTY    . CRANIOTOMY    . FRACTURE SURGERY    . INDUCED ABORTION     10/26/11  . ORTHOPEDIC SURGERY     Past Medical History:  Diagnosis Date  . Anxiety   . Arthritis    Knees and back   . Headache   . Loss of smell   . Thoracic compression fracture (HCC)   . Traumatic brain injury (HCC) 07/28/13   There were no vitals taken for this visit.  Opioid Risk Score:   Fall Risk Score:  `1  Depression screen PHQ 2/9  [facility] 06/09/2016 12/09/2015 04/07/2015  Decreased Interest 0 2 2  Down, Depressed, Hopeless 0 1 1  PHQ - 2 Score 0 3 3  Altered sleeping - - 1  Tired, decreased energy - - 3  Change in appetite - -  3  Feeling bad or failure about yourself  - - 3  Trouble concentrating - - 3  Moving slowly or fidgety/restless - - 1  Suicidal thoughts - - 0  PHQ-9 Score - - 17   Review of Systems  Constitutional: Negative.   HENT: Negative.   Eyes: Negative.   Respiratory: Negative.   Cardiovascular: Negative.   Gastrointestinal: Negative.   Endocrine: Negative.   Genitourinary: Negative.   Musculoskeletal: Positive for back pain.  Skin: Negative.   Allergic/Immunologic: Negative.   Neurological: Negative.   Hematological: Negative.   Psychiatric/Behavioral: The patient is nervous/anxious.   All other systems reviewed and are negative.      Objective:   Physical Exam  General: Alert and oriented x 3, No apparent distress. Wearing sun glasses again.  HEENT:NCAT  Neck:Supple without JVD or lymphadenopathy  Heart:RRR  Chest:clear.  Abdomen:Soft, non-tender,  non-distended, bowel sounds positive.  Extremities:No clubbing, cyanosis, or edema. Pulses are 2+  Skin:Clean and intact without signs of breakdown  Neuro:Strength nearly normal. Sensation intact. Attention and focus improved but quite anxious today. Needs few cues to stay on track with conversation.. She makes good eye contact with me. short term memory is functional. Remembered all of refills, questions, etc.    Musculoskeletal: left shoulder maneuvers negative Psych:Pt's affect is pleasant and cooperative but anxious.    Assessment & Plan:  1. Hx of TBI (07/28/13) with persistent cognitive and behavioral deficits. Also, she's having ongoing headaches, occasional vertigo, anosmia. Wanda Warner continues to demonstrate gradual improvement with attention/memory and behavior.  2. Hx of T6 compression fracture  3. Agoraphobia per psychology  4. Chronic low back pain.  5. Low back pain  6. 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 Continue ritalin 15mg  bid. A second RF for next month was given.  2. Continue topamax 50mg   3. Reviewed the value of aerobic exercise/walking for her mood/pain. I wrote her an exercise program today.  4. Continue counseling per Dr. Leonides Cave which had remained helpful.  5. Continue propranol 20mg  TID-- 6. xanax 0.5mg  prn 12hrs for anxiety #60. Continue to work on Pharmacologist per psychology. We discussed coping and adjustment at length at length once again today. Will also use low dose klonopin scheduled to help control anxiety and to assist in sleep..  7. Follow up with me in about 2 months. 30 minutes of face to face patient care time were spent during this visit. All questions were encouraged and answered. Pt may pick up new ritalin rx's in about 2 months time.

## 2016-10-13 NOTE — Addendum Note (Signed)
Addended by: Barbee ShropshireBRIGHT, Janaysia Mcleroy B on: 10/13/2016 02:41 PM   Modules accepted: Orders

## 2016-10-13 NOTE — Patient Instructions (Signed)
  Sharron'S EXERCISE PROGRAM:  DAILY: 5 MINUTES OF STRETCHING 10-15 MINUTES OF WALKING OUTSIDE OR ON THE TREADMILL 5-10 MINUTES USING YOUR EXERCISE BANDS FOR STRENGTHENING EXERCISES 5 MINUTE STRETCH/COOL DOWN  SET GOALS FOR SHORT TERM USING LENGTH OF TIME, SPEED, RESISTANCE, REPS AND SET LONG TERM GOALS AS WELL.

## 2016-10-19 LAB — TOXASSURE SELECT,+ANTIDEPR,UR

## 2016-10-21 NOTE — Progress Notes (Signed)
Urine drug screen for this encounter is consistent for prescribed medication 

## 2016-11-02 ENCOUNTER — Other Ambulatory Visit: Payer: Self-pay | Admitting: Physical Medicine & Rehabilitation

## 2016-11-02 DIAGNOSIS — Z5181 Encounter for therapeutic drug level monitoring: Secondary | ICD-10-CM

## 2016-11-02 DIAGNOSIS — S069X0D Unspecified intracranial injury without loss of consciousness, subsequent encounter: Secondary | ICD-10-CM

## 2016-11-02 DIAGNOSIS — Z79899 Other long term (current) drug therapy: Secondary | ICD-10-CM

## 2016-11-02 DIAGNOSIS — R41840 Attention and concentration deficit: Secondary | ICD-10-CM

## 2016-11-02 DIAGNOSIS — F068 Other specified mental disorders due to known physiological condition: Secondary | ICD-10-CM

## 2016-11-02 DIAGNOSIS — S069X0S Unspecified intracranial injury without loss of consciousness, sequela: Secondary | ICD-10-CM

## 2016-11-02 DIAGNOSIS — S069X5S Unspecified intracranial injury with loss of consciousness greater than 24 hours with return to pre-existing conscious level, sequela: Secondary | ICD-10-CM

## 2016-11-02 DIAGNOSIS — F411 Generalized anxiety disorder: Secondary | ICD-10-CM

## 2016-12-13 ENCOUNTER — Encounter: Payer: Self-pay | Admitting: Physical Medicine & Rehabilitation

## 2016-12-13 ENCOUNTER — Encounter: Payer: Medicare Other | Attending: Physical Medicine & Rehabilitation | Admitting: Physical Medicine & Rehabilitation

## 2016-12-13 VITALS — BP 119/82 | HR 59

## 2016-12-13 DIAGNOSIS — F419 Anxiety disorder, unspecified: Secondary | ICD-10-CM | POA: Insufficient documentation

## 2016-12-13 DIAGNOSIS — R42 Dizziness and giddiness: Secondary | ICD-10-CM | POA: Insufficient documentation

## 2016-12-13 DIAGNOSIS — S069X0S Unspecified intracranial injury without loss of consciousness, sequela: Secondary | ICD-10-CM

## 2016-12-13 DIAGNOSIS — R430 Anosmia: Secondary | ICD-10-CM | POA: Diagnosis not present

## 2016-12-13 DIAGNOSIS — F17210 Nicotine dependence, cigarettes, uncomplicated: Secondary | ICD-10-CM | POA: Diagnosis not present

## 2016-12-13 DIAGNOSIS — F411 Generalized anxiety disorder: Secondary | ICD-10-CM

## 2016-12-13 DIAGNOSIS — R51 Headache: Secondary | ICD-10-CM | POA: Insufficient documentation

## 2016-12-13 DIAGNOSIS — Z833 Family history of diabetes mellitus: Secondary | ICD-10-CM | POA: Insufficient documentation

## 2016-12-13 DIAGNOSIS — M25512 Pain in left shoulder: Secondary | ICD-10-CM | POA: Diagnosis not present

## 2016-12-13 DIAGNOSIS — Z87820 Personal history of traumatic brain injury: Secondary | ICD-10-CM | POA: Diagnosis not present

## 2016-12-13 DIAGNOSIS — M545 Low back pain: Secondary | ICD-10-CM | POA: Insufficient documentation

## 2016-12-13 DIAGNOSIS — Z9889 Other specified postprocedural states: Secondary | ICD-10-CM | POA: Diagnosis not present

## 2016-12-13 DIAGNOSIS — G8929 Other chronic pain: Secondary | ICD-10-CM | POA: Insufficient documentation

## 2016-12-13 DIAGNOSIS — Z8249 Family history of ischemic heart disease and other diseases of the circulatory system: Secondary | ICD-10-CM | POA: Diagnosis not present

## 2016-12-13 DIAGNOSIS — F068 Other specified mental disorders due to known physiological condition: Secondary | ICD-10-CM | POA: Diagnosis not present

## 2016-12-13 DIAGNOSIS — F4000 Agoraphobia, unspecified: Secondary | ICD-10-CM | POA: Insufficient documentation

## 2016-12-13 DIAGNOSIS — G44329 Chronic post-traumatic headache, not intractable: Secondary | ICD-10-CM

## 2016-12-13 DIAGNOSIS — S22059D Unspecified fracture of T5-T6 vertebra, subsequent encounter for fracture with routine healing: Secondary | ICD-10-CM | POA: Insufficient documentation

## 2016-12-13 MED ORDER — ALPRAZOLAM 0.25 MG PO TABS: 0.2500 mg | ORAL_TABLET | Freq: Every day | ORAL | 1 refills | Status: DC | PRN

## 2016-12-13 MED ORDER — METHYLPHENIDATE HCL 10 MG PO TABS: 10.0000 mg | ORAL_TABLET | Freq: Three times a day (TID) | ORAL | 0 refills | Status: DC

## 2016-12-13 NOTE — Patient Instructions (Signed)
BREAK THE PROPRANOLOL IN HALF AND USE TWICE DAILY UNTIL BOTTLE EMPTY  PLEASE FEEL FREE TO CALL OUR OFFICE WITH ANY PROBLEMS OR QUESTIONS [phone])

## 2016-12-13 NOTE — Progress Notes (Signed)
**Note Wanda-Identified via Obfuscation** Subjective:    Patient ID: Wanda Warner, female    DOB: 1981/04/13, 36 y.o.   MRN: [id number]  HPI   Wanda Warner is here in follow up of her TBI and associated deficits. She fell a few weeks ago when getting up from the couch when her feet got stuck in a blanket. She hit the side of her face/cheek and had a headache for a couple days. Otherwise she suffered no problems.   She is anxious about her potential for dementia given her TBI and family history of alzheimer's dementia.   Her sleep has been improved. The klonopin has helped her anxiety in general, so much that she doesn't have to use the xanax any more.   She decresaed her propranolol to 20mg  BID with no issues.    Pain Inventory Average Pain 5 Pain Right Now 5 My pain is sharp, dull, stabbing and aching  In the last 24 hours, has pain interfered with the following? General activity 7 Relation with others 5 Enjoyment of life 5 What TIME of day is your pain at its worst? all times Sleep (in general) Poor  Pain is worse with: walking, bending, sitting, standing and some activites Pain improves with: heat/ice Relief from Meds: 0  Mobility how many minutes can you walk? 5 ability to climb steps?  yes do you drive?  no Do you have any goals in this area?  yes  Function I need assistance with the following:  meal prep, household duties and shopping Do you have any goals in this area?  yes  Neuro/Psych anxiety loss of taste or smell  Prior Studies Any changes since last visit?  no  Physicians involved in your care Any changes since last visit?  no   Family History  Problem Relation Age of Onset  . Hypertension Father   . Diabetes Maternal Grandmother    Social History   Social History  . Marital status: Single    Spouse name: N/A  . Number of children: N/A  . Years of education: N/A   Social History Main Topics  . Smoking status: Current Every Day Smoker    Packs/day: 0.05    Years: 10.00    Types:  Cigarettes  . Smokeless tobacco: Never Used  . Alcohol use No  . Drug use: No  . Sexual activity: Yes    Birth control/ protection: None   Other Topics Concern  . Not on file   Social History Narrative   Lives in [place].    Hobbies: play solitaire and reading         Past Surgical History:  Procedure Laterality Date  . CRANIOPLASTY    . CRANIOTOMY    . FRACTURE SURGERY    . INDUCED ABORTION     10/26/11  . ORTHOPEDIC SURGERY     Past Medical History:  Diagnosis Date  . Anxiety   . Arthritis    Knees and back   . Headache   . Loss of smell   . Thoracic compression fracture (HCC)   . Traumatic brain injury (HCC) 07/28/13   There were no vitals taken for this visit.  Opioid Risk Score:   Fall Risk Score:  `1  Depression screen PHQ 2/9  [facility] 06/09/2016 12/09/2015 04/07/2015  Decreased Interest 0 2 2  Down, Depressed, Hopeless 0 1 1  PHQ - 2 Score 0 3 3  Altered sleeping - - 1  Tired, decreased energy - - 3  Change  in appetite - - 3  Feeling bad or failure about yourself  - - 3  Trouble concentrating - - 3  Moving slowly or fidgety/restless - - 1  Suicidal thoughts - - 0  PHQ-9 Score - - 17    Review of Systems  Constitutional: Negative.   HENT: Negative.   Eyes: Negative.   Respiratory: Negative.   Cardiovascular: Negative.   Gastrointestinal: Negative.   Endocrine: Negative.   Genitourinary: Negative.   Musculoskeletal: Positive for back pain.  Skin: Negative.   Neurological: Negative.   Hematological: Negative.   Psychiatric/Behavioral: The patient is nervous/anxious.        Objective:   Physical Exam  General: Alert and oriented x 3, No apparent distress. Wearing sun glasses again.  HEENT:NCAT  Neck:Supple without JVD or lymphadenopathy  Heart: RRR  Chest:Clear.  Abdomen:Soft, non-tender, non-distended, bowel sounds positive.  Extremities:No clubbing, cyanosis, or edema. Pulses are 2+  Skin:Clean and intact  without signs of breakdown  Neuro:anxious at times. Still a little impulsive. Attention better. .   Musculoskeletal:left shoulder maneuvers negative Psych:Pt's affect is pleasant and cooperative but anxious.    Assessment & Plan:  1. Hx of TBI (07/28/13) with persistent cognitive and behavioral deficits. Also, she's having ongoing headaches, occasional vertigo, anosmia. Wanda Warner continues to demonstrate gradual improvement with attention/memory and behavior.  2. Hx of T6 compression fracture  3. Agoraphobia per psychology  4. Chronic low back pain.  5. Low back pain  6. 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 Continue ritalin 15mg  bid. Was given RF for next month.  2. Continue topamax 50mg  for headache prophlaxis 3. Continue HEP--to build up strength and stamina.  4. Continue counseling per Dr. Leonides Cave which has remained helpful.  5. Continue propranol 20mg  TID--WEAN to off 6. Wean xanax to 0.25mg  qd prn. #30.  Continue  low dose klonopin scheduled to help control anxiety and to assist in sleep. Discussed coping skills and anxiety mgt.  7. Follow up with me in about 4 months. 15 minutes of face to face patient care time were spent during this visit. All questions were encouraged and answered. May pick up ritalin rx'es next season.

## 2017-01-12 ENCOUNTER — Other Ambulatory Visit: Payer: Self-pay

## 2017-01-12 NOTE — Telephone Encounter (Signed)
It's TID per the script. The progress note is a typo

## 2017-01-12 NOTE — Telephone Encounter (Signed)
Recieved faxed prior authorization form from patients pharmacy in [place] to methylphenidate, upon investigation to obtain necessary information for the PA I have ran into several inconsistencies and now need some clarification in order to precede:  Last note stated:  Wanda Warner continues to demonstrate gradual improvement with attention/memory and behavior.  Continue ritalin  bid. Was given RF for next month  Medication list states: Take 1-1.5 tablets (10-15 mg total) by mouth 3 (three) times daily. At 0700, 1200, & 1700 daily, Starting Mon 12/13/2016, Print  Dose, Route, Frequency: 10-15 mg, Oral, 3 times daily  Not sure which dosing instructions are the legitimate instructions.  Please advise

## 2017-01-13 ENCOUNTER — Telehealth: Payer: Self-pay

## 2017-01-13 NOTE — Telephone Encounter (Signed)
ERROR

## 2017-01-13 NOTE — Telephone Encounter (Signed)
Called pharmacy to complete PA, found out patient has used different insurance then what was on file on epic, was already authorizied and paid for by patient, noting on her next appointment to bring in and have [place] scan her MEDICAID PART D card, [facility]

## 2017-02-04 ENCOUNTER — Other Ambulatory Visit: Payer: Self-pay | Admitting: *Deleted

## 2017-02-04 ENCOUNTER — Telehealth: Payer: Self-pay | Admitting: *Deleted

## 2017-02-04 DIAGNOSIS — F411 Generalized anxiety disorder: Secondary | ICD-10-CM

## 2017-02-04 MED ORDER — CLONAZEPAM 0.5 MG PO TABS: 0.5000 mg | ORAL_TABLET | Freq: Two times a day (BID) | ORAL | 3 refills | Status: DC

## 2017-02-04 NOTE — Telephone Encounter (Signed)
Patient's husband called and left a message giving [place] advanced notice that they need to come by next week to get patient's next set of ritalin Rx's.

## 2017-02-04 NOTE — Telephone Encounter (Signed)
Dr. Rosalyn Charters last clinic note indicates that this is okay to do.  Contacted patient.  They will come Friday, May the 4th to pick up the scripts.

## 2017-02-09 ENCOUNTER — Other Ambulatory Visit: Payer: Self-pay | Admitting: *Deleted

## 2017-02-09 DIAGNOSIS — S069X0S Unspecified intracranial injury without loss of consciousness, sequela: Principal | ICD-10-CM

## 2017-02-09 DIAGNOSIS — F068 Other specified mental disorders due to known physiological condition: Secondary | ICD-10-CM

## 2017-02-09 MED ORDER — METHYLPHENIDATE HCL 10 MG PO TABS: 10.0000 mg | ORAL_TABLET | Freq: Three times a day (TID) | ORAL | 0 refills | Status: DC

## 2017-02-09 NOTE — Telephone Encounter (Signed)
Scripts printed and signed and left up front for pick up

## 2017-04-07 ENCOUNTER — Other Ambulatory Visit: Payer: Self-pay

## 2017-04-07 DIAGNOSIS — F068 Other specified mental disorders due to known physiological condition: Secondary | ICD-10-CM

## 2017-04-07 DIAGNOSIS — S069X0S Unspecified intracranial injury without loss of consciousness, sequela: Principal | ICD-10-CM

## 2017-04-07 MED ORDER — METHYLPHENIDATE HCL 10 MG PO TABS: 10.0000 mg | ORAL_TABLET | Freq: Three times a day (TID) | ORAL | 0 refills | Status: DC

## 2017-04-07 NOTE — Telephone Encounter (Signed)
Patients husband called requesting a refill for methylphenidate 10mg , according to last note:  Continue ritalin 15mg  bid. Was given RF for next month.   Noted that patient was increased on her medication to 1.5 tablets 3 X day so increased her amount to 135 tabs dispensed for 1 month supply.  Since Dr. Riley KillSwartz is currently on vacation until 3rd of July, printed prescription and current NCCSR for [place] to Sign.

## 2017-04-08 ENCOUNTER — Other Ambulatory Visit: Payer: Self-pay | Admitting: *Deleted

## 2017-04-08 DIAGNOSIS — Z79899 Other long term (current) drug therapy: Secondary | ICD-10-CM

## 2017-04-08 DIAGNOSIS — F068 Other specified mental disorders due to known physiological condition: Secondary | ICD-10-CM

## 2017-04-08 DIAGNOSIS — F411 Generalized anxiety disorder: Secondary | ICD-10-CM

## 2017-04-08 DIAGNOSIS — S069X0S Unspecified intracranial injury without loss of consciousness, sequela: Secondary | ICD-10-CM

## 2017-04-08 DIAGNOSIS — S069XAS Unspecified intracranial injury with loss of consciousness status unknown, sequela: Secondary | ICD-10-CM

## 2017-04-08 DIAGNOSIS — R41840 Attention and concentration deficit: Secondary | ICD-10-CM

## 2017-04-08 DIAGNOSIS — S069X5S Unspecified intracranial injury with loss of consciousness greater than 24 hours with return to pre-existing conscious level, sequela: Secondary | ICD-10-CM

## 2017-04-08 DIAGNOSIS — S069X0D Unspecified intracranial injury without loss of consciousness, subsequent encounter: Secondary | ICD-10-CM

## 2017-04-08 DIAGNOSIS — Z5181 Encounter for therapeutic drug level monitoring: Secondary | ICD-10-CM

## 2017-04-08 MED ORDER — TOPIRAMATE 50 MG PO TABS: 50.0000 mg | ORAL_TABLET | Freq: Every day | ORAL | 4 refills | Status: DC

## 2017-04-08 MED ORDER — METHYLPHENIDATE HCL 10 MG PO TABS: 15.0000 mg | ORAL_TABLET | Freq: Two times a day (BID) | ORAL | 0 refills | Status: DC

## 2017-04-08 NOTE — Telephone Encounter (Signed)
I explained to Mr Wanda Warner that we can only write Rx for methylphenidate per Dr Wanda KillSwartz note at last visit.  It reads 1.5 tablet (15 mg) twice daily.  So we will write #90 tablets and then when they see Dr Wanda KillSwartz on 04/18/17 they can address if she needs more and the sig and disp needs to be updated to the note.  That is why they have had problems filling the additional number of pills with the pharmacy because insurance will only cover what the directions call for daily.  He agrees.  Wanda Warner will sign the Rx and he will pick up today.

## 2017-04-11 ENCOUNTER — Other Ambulatory Visit: Payer: Self-pay | Admitting: *Deleted

## 2017-04-11 DIAGNOSIS — R41840 Attention and concentration deficit: Secondary | ICD-10-CM

## 2017-04-11 DIAGNOSIS — Z79899 Other long term (current) drug therapy: Secondary | ICD-10-CM

## 2017-04-11 DIAGNOSIS — S069XAS Unspecified intracranial injury with loss of consciousness status unknown, sequela: Secondary | ICD-10-CM

## 2017-04-11 DIAGNOSIS — F068 Other specified mental disorders due to known physiological condition: Secondary | ICD-10-CM

## 2017-04-11 DIAGNOSIS — S069X5S Unspecified intracranial injury with loss of consciousness greater than 24 hours with return to pre-existing conscious level, sequela: Secondary | ICD-10-CM

## 2017-04-11 DIAGNOSIS — S069X0D Unspecified intracranial injury without loss of consciousness, subsequent encounter: Secondary | ICD-10-CM

## 2017-04-11 DIAGNOSIS — F411 Generalized anxiety disorder: Secondary | ICD-10-CM

## 2017-04-11 DIAGNOSIS — S069X0S Unspecified intracranial injury without loss of consciousness, sequela: Secondary | ICD-10-CM

## 2017-04-11 DIAGNOSIS — Z5181 Encounter for therapeutic drug level monitoring: Secondary | ICD-10-CM

## 2017-04-11 DIAGNOSIS — R4189 Other symptoms and signs involving cognitive functions and awareness: Secondary | ICD-10-CM

## 2017-04-11 MED ORDER — TOPIRAMATE 50 MG PO TABS: 50.0000 mg | ORAL_TABLET | Freq: Every day | ORAL | 4 refills | Status: DC

## 2017-04-18 ENCOUNTER — Encounter: Payer: Self-pay | Admitting: Physical Medicine & Rehabilitation

## 2017-04-18 ENCOUNTER — Encounter: Payer: Medicare Other | Attending: Physical Medicine & Rehabilitation | Admitting: Physical Medicine & Rehabilitation

## 2017-04-18 VITALS — BP 121/86 | HR 80

## 2017-04-18 DIAGNOSIS — G8929 Other chronic pain: Secondary | ICD-10-CM | POA: Insufficient documentation

## 2017-04-18 DIAGNOSIS — R430 Anosmia: Secondary | ICD-10-CM | POA: Insufficient documentation

## 2017-04-18 DIAGNOSIS — F068 Other specified mental disorders due to known physiological condition: Secondary | ICD-10-CM

## 2017-04-18 DIAGNOSIS — G44329 Chronic post-traumatic headache, not intractable: Secondary | ICD-10-CM | POA: Diagnosis not present

## 2017-04-18 DIAGNOSIS — M25512 Pain in left shoulder: Secondary | ICD-10-CM | POA: Diagnosis not present

## 2017-04-18 DIAGNOSIS — M545 Low back pain: Secondary | ICD-10-CM | POA: Insufficient documentation

## 2017-04-18 DIAGNOSIS — S069X0S Unspecified intracranial injury without loss of consciousness, sequela: Secondary | ICD-10-CM | POA: Diagnosis not present

## 2017-04-18 DIAGNOSIS — F17210 Nicotine dependence, cigarettes, uncomplicated: Secondary | ICD-10-CM | POA: Insufficient documentation

## 2017-04-18 DIAGNOSIS — R42 Dizziness and giddiness: Secondary | ICD-10-CM | POA: Insufficient documentation

## 2017-04-18 DIAGNOSIS — F411 Generalized anxiety disorder: Secondary | ICD-10-CM | POA: Diagnosis not present

## 2017-04-18 DIAGNOSIS — S069X0A Unspecified intracranial injury without loss of consciousness, initial encounter: Secondary | ICD-10-CM | POA: Diagnosis not present

## 2017-04-18 DIAGNOSIS — F419 Anxiety disorder, unspecified: Secondary | ICD-10-CM | POA: Insufficient documentation

## 2017-04-18 DIAGNOSIS — R51 Headache: Secondary | ICD-10-CM | POA: Insufficient documentation

## 2017-04-18 DIAGNOSIS — F4000 Agoraphobia, unspecified: Secondary | ICD-10-CM | POA: Insufficient documentation

## 2017-04-18 DIAGNOSIS — X58XXXA Exposure to other specified factors, initial encounter: Secondary | ICD-10-CM | POA: Diagnosis not present

## 2017-04-18 MED ORDER — METHYLPHENIDATE HCL 10 MG PO TABS: 15.0000 mg | ORAL_TABLET | Freq: Three times a day (TID) | ORAL | 0 refills | Status: DC

## 2017-04-18 MED ORDER — ALPRAZOLAM 0.25 MG PO TABS: 0.2500 mg | ORAL_TABLET | Freq: Every day | ORAL | 1 refills | Status: DC | PRN

## 2017-04-18 MED ORDER — CLONAZEPAM 0.5 MG PO TABS: 0.5000 mg | ORAL_TABLET | Freq: Two times a day (BID) | ORAL | 3 refills | Status: DC

## 2017-04-18 NOTE — Progress Notes (Signed)
Subjective:    Patient ID: Wanda Warner, female    DOB: [birth year], 36 y.o.   MRN: [id number]  HPI   [place] is here in follow up of her chronic brain injury. She has been fairly well until running out of topamax last month.   She realized that the medication was really helping her headaches. Also her ritalin was written incorrectly and is running out early.   She has made a friend with someone with brain cancer and they have had something with common. She still has anxiety of going out doors. She has friends which she hesitates to go out with. She still doesn' want to drive.   Pain Inventory Average Pain 5 Pain Right Now 5 My pain is burning, dull, stabbing and aching  In the last 24 hours, has pain interfered with the following? General activity 3 Relation with others 3 Enjoyment of life 5 What TIME of day is your pain at its worst? daytime Sleep (in general) Fair  Pain is worse with: bending, sitting, standing and some activites Pain improves with: . Relief from Meds: 0  Mobility ability to climb steps?  yes do you drive?  no  Function disabled: date disabled 2014  Neuro/Psych anxiety loss of taste or smell  Prior Studies Any changes since last visit?  no  Physicians involved in your care Any changes since last visit?  no   Family History  Problem Relation Age of Onset  . Hypertension Father   . Diabetes Maternal Grandmother    Social History   Social History  . Marital status: Single    Spouse name: N/A  . Number of children: N/A  . Years of education: N/A   Social History Main Topics  . Smoking status: Current Every Day Smoker    Packs/day: 0.05    Years: 10.00    Types: Cigarettes  . Smokeless tobacco: Never Used  . Alcohol use No  . Drug use: No  . Sexual activity: Yes    Birth control/ protection: None   Other Topics Concern  . Not on file   Social History Narrative   Lives in [place].    Hobbies: play solitaire and reading          Past Surgical History:  Procedure Laterality Date  . CRANIOPLASTY    . CRANIOTOMY    . FRACTURE SURGERY    . INDUCED ABORTION     10/26/11  . ORTHOPEDIC SURGERY     Past Medical History:  Diagnosis Date  . Anxiety   . Arthritis    Knees and back   . Headache   . Loss of smell   . Thoracic compression fracture (HCC)   . Traumatic brain injury (HCC) 07/28/13   There were no vitals taken for this visit.  Opioid Risk Score:   Fall Risk Score:  `1  Depression screen PHQ 2/9  Depression screen [facility] 2/9 06/09/2016 12/09/2015 04/07/2015  Decreased Interest 0 2 2  Down, Depressed, Hopeless 0 1 1  PHQ - 2 Score 0 3 3  Altered sleeping - - 1  Tired, decreased energy - - 3  Change in appetite - - 3  Feeling bad or failure about yourself  - - 3  Trouble concentrating - - 3  Moving slowly or fidgety/restless - - 1  Suicidal thoughts - - 0  PHQ-9 Score - - 17     Review of Systems  Constitutional: Negative.   HENT: Negative.   Eyes: Negative.  Respiratory: Negative.   Cardiovascular: Negative.   Gastrointestinal: Negative.   Endocrine: Negative.   Genitourinary: Negative.   Musculoskeletal: Negative.   Skin: Negative.   Allergic/Immunologic: Negative.   Neurological: Negative.   Hematological: Negative.   Psychiatric/Behavioral: Negative.   All other systems reviewed and are negative.      Objective:   Physical Exam  General: Alert and oriented x 3, No apparent distress. Wearing sun glasses again.  HEENT:NCAT Neck:Supple without JVD or lymphadenopathy  Heart: RRR Chest:effort.  Abdomen:Soft, non-tender, non-distended, bowel sounds positive.  Extremities:No clubbing, cyanosis, or edema. Pulses are 2+  Skin:Clean and intact without signs of breakdown  Neuro:anxious at times. Attention improves but does need some redirection.. .  Musculoskeletal:left shoulder maneuvers negative Psych:Pt's affect is pleasant and cooperative but anxious.     Assessment & Plan:  1. Hx of TBI (07/28/13) with persistent cognitive and behavioral deficits. Also, she's having ongoing headaches, occasional vertigo, anosmia.   -making gradual gains.   2. Hx of T6 compression fracture  3. Agoraphobia per psychology  4. Chronic low back pain.  5. Low back pain  6. 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 Continue ritalin 15mg  tid  #120. Was given RF for next month.  2. Continue topamax 50mg  for headache prophlaxis 3. Continue HEP--to build up strength and stamina. Encouraged her to get out and socialize.   4. Continue counseling per Dr. Leonides CaveZelson which has remained helpful. She may see Dr. Kieth Brightlyrodenbough if she chooses 5. Off propranolol without issues.  6.   xanax to 0.25mg  qd prn. #30.  Continue  low dose klonopin scheduled to help control anxiety and to assist in sleep. continue with coping skills and anxiety mgt.  7. Follow up with me in about 4months. 15 minutes of face to face patient care time were spent during this visit. All questions were encouraged and answered. May pick up ritalin rx'es next season.

## 2017-04-18 NOTE — Patient Instructions (Signed)
PLEASE FEEL FREE TO CALL OUR OFFICE WITH ANY PROBLEMS OR QUESTIONS [phone])    Work on regular exercise at home

## 2017-06-16 ENCOUNTER — Other Ambulatory Visit: Payer: Self-pay

## 2017-06-16 DIAGNOSIS — F411 Generalized anxiety disorder: Secondary | ICD-10-CM

## 2017-06-16 MED ORDER — ALPRAZOLAM 0.25 MG PO TABS: 0.2500 mg | ORAL_TABLET | Freq: Every day | ORAL | 1 refills | Status: DC | PRN

## 2017-06-16 MED ORDER — ALPRAZOLAM 0.25 MG PO TABS: 0.2500 mg | ORAL_TABLET | Freq: Every day | ORAL | 3 refills | Status: DC | PRN

## 2017-06-16 MED ORDER — CLONAZEPAM 0.5 MG PO TABS: 0.5000 mg | ORAL_TABLET | Freq: Two times a day (BID) | ORAL | 3 refills | Status: DC

## 2017-06-16 NOTE — Telephone Encounter (Signed)
Patient called a refill of klonopin and xanax to [facility], also asking for next 2 prescriptions for ritalin, called in [facility], will print and have dr sign ritalin on monday

## 2017-06-22 ENCOUNTER — Other Ambulatory Visit: Payer: Self-pay

## 2017-06-22 DIAGNOSIS — F068 Other specified mental disorders due to known physiological condition: Secondary | ICD-10-CM

## 2017-06-22 DIAGNOSIS — S069X0S Unspecified intracranial injury without loss of consciousness, sequela: Principal | ICD-10-CM

## 2017-06-22 MED ORDER — METHYLPHENIDATE HCL 10 MG PO TABS: 15.0000 mg | ORAL_TABLET | Freq: Three times a day (TID) | ORAL | 0 refills | Status: DC

## 2017-07-28 DIAGNOSIS — F0280 Dementia in other diseases classified elsewhere without behavioral disturbance: Secondary | ICD-10-CM | POA: Diagnosis not present

## 2017-07-28 DIAGNOSIS — F419 Anxiety disorder, unspecified: Secondary | ICD-10-CM | POA: Diagnosis not present

## 2017-08-10 ENCOUNTER — Other Ambulatory Visit: Payer: Self-pay

## 2017-08-10 DIAGNOSIS — S069X0S Unspecified intracranial injury without loss of consciousness, sequela: Principal | ICD-10-CM

## 2017-08-10 DIAGNOSIS — F068 Other specified mental disorders due to known physiological condition: Secondary | ICD-10-CM

## 2017-08-10 MED ORDER — METHYLPHENIDATE HCL 10 MG PO TABS: 15.0000 mg | ORAL_TABLET | Freq: Three times a day (TID) | ORAL | 0 refills | Status: DC

## 2017-08-10 NOTE — Telephone Encounter (Signed)
Called patient and left voicemail to return call and to inform them medications are ready to be picked up and to keep next appointment

## 2017-08-10 NOTE — Telephone Encounter (Signed)
Wanda LagerShane Warner called on behalf of patient and stated that she needs a refill on her ritalin and that her appointment is scheduled a week after she runs out of medication.

## 2017-08-10 NOTE — Telephone Encounter (Signed)
Prescriptions written and printed

## 2017-08-10 NOTE — Telephone Encounter (Signed)
Double checked NCCSR/pmp aware, patients last refill of medication was on 07/19/2017 and she will be out of medication on 08/18/2017 and her appointment is on 08/24/2017

## 2017-08-12 NOTE — Telephone Encounter (Signed)
Mr Wanda Warner called back and was informed of medications ready to be picked up

## 2017-08-22 ENCOUNTER — Ambulatory Visit: Payer: Medicare Other | Admitting: Physical Medicine & Rehabilitation

## 2017-08-24 ENCOUNTER — Encounter: Payer: Medicare Other | Attending: Physical Medicine & Rehabilitation | Admitting: Physical Medicine & Rehabilitation

## 2017-08-24 ENCOUNTER — Encounter: Payer: Self-pay | Admitting: Physical Medicine & Rehabilitation

## 2017-08-24 ENCOUNTER — Other Ambulatory Visit: Payer: Self-pay

## 2017-08-24 DIAGNOSIS — F17210 Nicotine dependence, cigarettes, uncomplicated: Secondary | ICD-10-CM | POA: Diagnosis not present

## 2017-08-24 DIAGNOSIS — Z79899 Other long term (current) drug therapy: Secondary | ICD-10-CM

## 2017-08-24 DIAGNOSIS — S069X0S Unspecified intracranial injury without loss of consciousness, sequela: Secondary | ICD-10-CM | POA: Diagnosis not present

## 2017-08-24 DIAGNOSIS — Z5189 Encounter for other specified aftercare: Secondary | ICD-10-CM | POA: Insufficient documentation

## 2017-08-24 DIAGNOSIS — F411 Generalized anxiety disorder: Secondary | ICD-10-CM | POA: Diagnosis not present

## 2017-08-24 DIAGNOSIS — F419 Anxiety disorder, unspecified: Secondary | ICD-10-CM | POA: Insufficient documentation

## 2017-08-24 DIAGNOSIS — Z87820 Personal history of traumatic brain injury: Secondary | ICD-10-CM | POA: Diagnosis not present

## 2017-08-24 DIAGNOSIS — R41840 Attention and concentration deficit: Secondary | ICD-10-CM

## 2017-08-24 DIAGNOSIS — F068 Other specified mental disorders due to known physiological condition: Secondary | ICD-10-CM | POA: Diagnosis not present

## 2017-08-24 DIAGNOSIS — R4189 Other symptoms and signs involving cognitive functions and awareness: Secondary | ICD-10-CM

## 2017-08-24 DIAGNOSIS — G8929 Other chronic pain: Secondary | ICD-10-CM | POA: Diagnosis not present

## 2017-08-24 DIAGNOSIS — Z5181 Encounter for therapeutic drug level monitoring: Secondary | ICD-10-CM | POA: Diagnosis not present

## 2017-08-24 DIAGNOSIS — S069X5S Unspecified intracranial injury with loss of consciousness greater than 24 hours with return to pre-existing conscious level, sequela: Secondary | ICD-10-CM

## 2017-08-24 DIAGNOSIS — S069X0D Unspecified intracranial injury without loss of consciousness, subsequent encounter: Secondary | ICD-10-CM | POA: Diagnosis not present

## 2017-08-24 MED ORDER — METHYLPHENIDATE HCL 10 MG PO TABS: 15.0000 mg | ORAL_TABLET | Freq: Three times a day (TID) | ORAL | 0 refills | Status: DC

## 2017-08-24 MED ORDER — TOPIRAMATE 50 MG PO TABS: 50.0000 mg | ORAL_TABLET | Freq: Every day | ORAL | 4 refills | Status: DC

## 2017-08-24 NOTE — Patient Instructions (Signed)
PLEASE FEEL FREE TO CALL OUR OFFICE WITH ANY PROBLEMS OR QUESTIONS ([phone])      

## 2017-08-24 NOTE — Progress Notes (Signed)
**Note Wanda-Identified via Obfuscation** Subjective:    Patient ID: Wanda Warner, female    DOB: [birth year], 36 y.o.   MRN: [id number]  HPI   Wanda Warner is here in follow up of her TBI. She has been generally doing well for the most part. She has some family stresses which she can struggle with from an anxiety standpoint. They have a brother staying at the house who has created a lot of stress.   She remains on ritalin which helps her focus. She is using klonopin and xanax to keep her behavior calm. She has some strategies to control her anxiety but forgets to use them sometimes.      Pain Inventory Average Pain 4 Pain Right Now 4 My pain is burning, dull, stabbing and aching  In the last 24 hours, has pain interfered with the following? General activity 5 Relation with others 5 Enjoyment of life 5 What TIME of day is your pain at its worst? daytime and evening Sleep (in general) Fair  Pain is worse with: . Pain improves with: . Relief from Meds: 5  Mobility walk without assistance ability to climb steps?  yes do you drive?  no Do you have any goals in this area?  yes  Function disabled: date disabled . I need assistance with the following:  meal prep, household duties and shopping Do you have any goals in this area?  yes  Neuro/Psych anxiety loss of taste or smell  Prior Studies Any changes since last visit?  no  Physicians involved in your care Any changes since last visit?  no   Family History  Problem Relation Age of Onset  . Hypertension Father   . Diabetes Maternal Grandmother    Social History   Socioeconomic History  . Marital status: Single    Spouse name: Not on file  . Number of children: Not on file  . Years of education: Not on file  . Highest education level: Not on file  Social Needs  . Financial resource strain: Not on file  . Food insecurity - worry: Not on file  . Food insecurity - inability: Not on file  . Transportation needs - medical: Not on file  . Transportation needs -  non-medical: Not on file  Occupational History  . Not on file  Tobacco Use  . Smoking status: Current Every Day Smoker    Packs/day: 0.05    Years: 10.00    Pack years: 0.50    Types: Cigarettes  . Smokeless tobacco: Never Used  Substance and Sexual Activity  . Alcohol use: No    Alcohol/week: 1.2 oz    Types: 2 Cans of beer per week  . Drug use: No  . Sexual activity: Yes    Birth control/protection: None  Other Topics Concern  . Not on file  Social History Narrative   Lives in [place]    Hobbies: play solitaire and reading         Past Surgical History:  Procedure Laterality Date  . CRANIOPLASTY    . CRANIOTOMY    . FRACTURE SURGERY    . INDUCED ABORTION     10/26/11  . ORTHOPEDIC SURGERY     Past Medical History:  Diagnosis Date  . Anxiety   . Arthritis    Knees and back   . Headache   . Loss of smell   . Thoracic compression fracture (HCC)   . Traumatic brain injury (HCC) 07/28/13   There were no vitals taken for this visit.  Opioid Risk Score:   Fall Risk Score:  `1  Depression screen PHQ 2/9  [facility] 2/9 08/24/2017 06/09/2016 12/09/2015 04/07/2015  Decreased Interest 0 0 2 2  Down, Depressed, Hopeless 0 0 1 1  PHQ - 2 Score 0 0 3 3  Altered sleeping - - - 1  Tired, decreased energy - - - 3  Change in appetite - - - 3  Feeling bad or failure about yourself  - - - 3  Trouble concentrating - - - 3  Moving slowly or fidgety/restless - - - 1  Suicidal thoughts - - - 0  PHQ-9 Score - - - 17      Review of Systems  Constitutional: Negative.   HENT: Negative.   Eyes: Negative.   Respiratory: Negative.   Cardiovascular: Negative.   Gastrointestinal: Negative.   Endocrine: Negative.   Genitourinary: Negative.   Musculoskeletal: Negative.   Skin: Negative.   Allergic/Immunologic: Negative.   Neurological: Negative.   Hematological: Negative.   Psychiatric/Behavioral: Negative.        Objective:   Physical Exam  General:  Alert and oriented x 3, No apparent distress. Wearing sun glasses again.  HEENT:NCAT Neck:Supple without JVD or lymphadenopathy  Heart: RRR Chest:effort Abdomen:Soft, non-tender, non-distended, bowel sounds positive.  Extremities:No clubbing, cyanosis, or edema. Pulses are 2+  Skin:Clean and intact without signs of breakdown  Neuro:anxious per baseline but focusing better. Improved insight and awareness.  .  Musculoskeletal:normal cervical and shoulder ROM Psych:Pt's affect is pleasant and cooperative but anxious.    Assessment & Plan:  1. Hx of TBI (07/28/13) with persistent cognitive and behavioral deficits. Also, she's having ongoing headaches, occasional vertigo, anosmia.              -making gradual gains.   2. Hx of T6 compression fracture  3. Agoraphobia per psychology  4. Chronic low back pain.  5. Low back pain  6. 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 Continue ritalin 15mg  tid  #120. Was given RF for next month.  2. Continue topamax 50mg  for headache prophlaxis 3. Continue HEP--to build up strength and stamina. Encouraged her to get out and socialize.   4. Continue counseling per Dr. Leonides CaveZelson which has continued to be very helpful.  5. Xanax 0.25mg  qd prn. #30. Continue low dose klonopin scheduled to help control anxiety and to assist in sleep. continue with coping skills and anxiety mgt which re reviewed at length.  7. Follow up with me in about 4months.15minutes of face to face patient care time were spent during this visit. All questions were encouraged and answered. May pick up ritalin rx'es in a month

## 2017-11-05 ENCOUNTER — Other Ambulatory Visit: Payer: Self-pay | Admitting: Physical Medicine & Rehabilitation

## 2017-11-05 DIAGNOSIS — F411 Generalized anxiety disorder: Secondary | ICD-10-CM

## 2017-11-09 ENCOUNTER — Other Ambulatory Visit: Payer: Self-pay

## 2017-11-09 DIAGNOSIS — F411 Generalized anxiety disorder: Secondary | ICD-10-CM

## 2017-11-09 MED ORDER — CLONAZEPAM 0.5 MG PO TABS: 0.5000 mg | ORAL_TABLET | Freq: Two times a day (BID) | ORAL | 2 refills | Status: DC

## 2017-11-29 ENCOUNTER — Telehealth: Payer: Self-pay | Admitting: Physical Medicine & Rehabilitation

## 2017-11-29 DIAGNOSIS — R4189 Other symptoms and signs involving cognitive functions and awareness: Secondary | ICD-10-CM

## 2017-11-29 DIAGNOSIS — F068 Other specified mental disorders due to known physiological condition: Secondary | ICD-10-CM

## 2017-11-29 DIAGNOSIS — S069X0S Unspecified intracranial injury without loss of consciousness, sequela: Principal | ICD-10-CM

## 2017-11-29 NOTE — Telephone Encounter (Signed)
Last fill date was 11/05/17 by PMP aware.  Next appt is 12/13/17 with Riley KillSwartz.  Riley Lamunice can send Ritalin to pharmacy.

## 2017-11-29 NOTE — Telephone Encounter (Signed)
Patient is needing a refill on Ritalin.  Please call Vincenza HewsShane.

## 2017-11-30 MED ORDER — METHYLPHENIDATE HCL 10 MG PO TABS: 15.0000 mg | ORAL_TABLET | Freq: Three times a day (TID) | ORAL | 0 refills | Status: DC

## 2017-11-30 NOTE — Telephone Encounter (Signed)
PMP Aware site reviewed. Methylphenidate was picked up on 11/05/2017. Methylphenidate prescription e-scribed

## 2017-12-13 ENCOUNTER — Encounter: Payer: Medicare Other | Attending: Physical Medicine & Rehabilitation | Admitting: Physical Medicine & Rehabilitation

## 2017-12-13 ENCOUNTER — Encounter: Payer: Self-pay | Admitting: Physical Medicine & Rehabilitation

## 2017-12-13 VITALS — BP 122/86 | HR 97

## 2017-12-13 DIAGNOSIS — F17210 Nicotine dependence, cigarettes, uncomplicated: Secondary | ICD-10-CM | POA: Diagnosis not present

## 2017-12-13 DIAGNOSIS — Z79899 Other long term (current) drug therapy: Secondary | ICD-10-CM | POA: Diagnosis not present

## 2017-12-13 DIAGNOSIS — F068 Other specified mental disorders due to known physiological condition: Secondary | ICD-10-CM | POA: Diagnosis not present

## 2017-12-13 DIAGNOSIS — G44329 Chronic post-traumatic headache, not intractable: Secondary | ICD-10-CM | POA: Diagnosis not present

## 2017-12-13 DIAGNOSIS — G8929 Other chronic pain: Secondary | ICD-10-CM | POA: Diagnosis not present

## 2017-12-13 DIAGNOSIS — Z5181 Encounter for therapeutic drug level monitoring: Secondary | ICD-10-CM | POA: Diagnosis not present

## 2017-12-13 DIAGNOSIS — S069X0D Unspecified intracranial injury without loss of consciousness, subsequent encounter: Secondary | ICD-10-CM

## 2017-12-13 DIAGNOSIS — M545 Low back pain: Secondary | ICD-10-CM | POA: Diagnosis not present

## 2017-12-13 DIAGNOSIS — F411 Generalized anxiety disorder: Secondary | ICD-10-CM | POA: Diagnosis not present

## 2017-12-13 DIAGNOSIS — R42 Dizziness and giddiness: Secondary | ICD-10-CM | POA: Insufficient documentation

## 2017-12-13 DIAGNOSIS — F419 Anxiety disorder, unspecified: Secondary | ICD-10-CM | POA: Insufficient documentation

## 2017-12-13 DIAGNOSIS — M25512 Pain in left shoulder: Secondary | ICD-10-CM | POA: Diagnosis not present

## 2017-12-13 DIAGNOSIS — F4000 Agoraphobia, unspecified: Secondary | ICD-10-CM | POA: Insufficient documentation

## 2017-12-13 DIAGNOSIS — R4189 Other symptoms and signs involving cognitive functions and awareness: Secondary | ICD-10-CM

## 2017-12-13 DIAGNOSIS — R430 Anosmia: Secondary | ICD-10-CM | POA: Insufficient documentation

## 2017-12-13 DIAGNOSIS — S069X0S Unspecified intracranial injury without loss of consciousness, sequela: Secondary | ICD-10-CM

## 2017-12-13 DIAGNOSIS — R51 Headache: Secondary | ICD-10-CM | POA: Insufficient documentation

## 2017-12-13 DIAGNOSIS — Z87820 Personal history of traumatic brain injury: Secondary | ICD-10-CM | POA: Diagnosis not present

## 2017-12-13 DIAGNOSIS — S069X5S Unspecified intracranial injury with loss of consciousness greater than 24 hours with return to pre-existing conscious level, sequela: Secondary | ICD-10-CM | POA: Diagnosis not present

## 2017-12-13 DIAGNOSIS — R41840 Attention and concentration deficit: Secondary | ICD-10-CM

## 2017-12-13 DIAGNOSIS — R439 Unspecified disturbances of smell and taste: Secondary | ICD-10-CM | POA: Diagnosis not present

## 2017-12-13 MED ORDER — TOPIRAMATE 50 MG PO TABS: 50.0000 mg | ORAL_TABLET | Freq: Every day | ORAL | 4 refills | Status: DC

## 2017-12-13 MED ORDER — METHYLPHENIDATE HCL 10 MG PO TABS: 15.0000 mg | ORAL_TABLET | Freq: Three times a day (TID) | ORAL | 0 refills | Status: DC

## 2017-12-13 NOTE — Patient Instructions (Signed)
PLEASE FEEL FREE TO CALL OUR OFFICE WITH ANY PROBLEMS OR QUESTIONS ([phone])      

## 2017-12-13 NOTE — Progress Notes (Signed)
Subjective:    Patient ID: Wanda Warner, female    DOB: May 27, 1981, 37 y.o.   MRN: [id number]  HPI   Wanda Warner is here in follow-up of her traumatic brain injury and associated cognitive and behavioral issues.  She continues to deal with a lot of anxiety.  Unfortunately they have had some further stresses at the home including the death of her husband's 72 year old daughter which was unexpected.  While her husband is saddened by her loss, Wanda Warner also has taken up on some of his stress and anxiety as well and had more difficulties with her mood and relations with others.  Her sleep also has been affected.  She remains on the Ritalin as prescribed.  She is also using the Klonopin and as needed Xanax.  Pain fortunately, has not been overly increased.  Wanda Warner admits to exercising less however and wants to get back out and start walking again.  She has a treadmill in the house in fact.  Pain Inventory Average Pain 6 Pain Right Now 6 My pain is sharp, dull, stabbing and aching  In the last 24 hours, has pain interfered with the following? General activity 4 Relation with others 5 Enjoyment of life 5 What TIME of day is your pain at its worst? daytime Sleep (in general) Fair  Pain is worse with: sitting, inactivity and standing Pain improves with: . Relief from Meds: .  Mobility walk without assistance ability to climb steps?  yes do you drive?  no  Function disabled: date disabled .  Neuro/Psych anxiety loss of taste or smell  Prior Studies Any changes since last visit?  no  Physicians involved in your care Any changes since last visit?  no   Family History  Problem Relation Age of Onset  . Hypertension Father   . Diabetes Maternal Grandmother    Social History   Socioeconomic History  . Marital status: Single    Spouse name: None  . Number of children: None  . Years of education: None  . Highest education level: None  Social Needs  . Financial resource strain: None  .  Food insecurity - worry: None  . Food insecurity - inability: None  . Transportation needs - medical: None  . Transportation needs - non-medical: None  Occupational History  . None  Tobacco Use  . Smoking status: Current Every Day Smoker    Packs/day: 0.05    Years: 10.00    Pack years: 0.50    Types: Cigarettes  . Smokeless tobacco: Never Used  Substance and Sexual Activity  . Alcohol use: No    Alcohol/week: 1.2 oz    Types: 2 Cans of beer per week  . Drug use: No  . Sexual activity: Yes    Birth control/protection: None  Other Topics Concern  . None  Social History Narrative   Lives in [place].    Hobbies: play solitaire and reading         Past Surgical History:  Procedure Laterality Date  . CRANIOPLASTY    . CRANIOTOMY    . FRACTURE SURGERY    . INDUCED ABORTION     10/26/11  . ORTHOPEDIC SURGERY     Past Medical History:  Diagnosis Date  . Anxiety   . Arthritis    Knees and back   . Headache   . Loss of smell   . Thoracic compression fracture (HCC)   . Traumatic brain injury (HCC) 07/28/13   BP 122/86   Pulse 97  SpO2 98%   Opioid Risk Score:   Fall Risk Score:  `1  Depression screen PHQ 2/9  Dep[facility] 08/24/2017 06/09/2016 12/09/2015 04/07/2015  Decreased Interest 0 0 2 2  Down, Depressed, Hopeless 0 0 1 1  PHQ - 2 Score 0 0 3 3  Altered sleeping - - - 1  Tired, decreased energy - - - 3  Change in appetite - - - 3  Feeling bad or failure about yourself  - - - 3  Trouble concentrating - - - 3  Moving slowly or fidgety/restless - - - 1  Suicidal thoughts - - - 0  PHQ-9 Score - - - 17    Review of Systems  Constitutional: Negative.   HENT: Negative.   Eyes: Negative.   Respiratory: Negative.   Cardiovascular: Negative.   Gastrointestinal: Negative.   Endocrine: Negative.   Genitourinary: Negative.   Musculoskeletal: Negative.   Skin: Negative.   Allergic/Immunologic: Negative.   Neurological: Negative.     Hematological: Negative.   Psychiatric/Behavioral: Negative.   All other systems reviewed and are negative.      Objective:   Physical Exam  General: No distress HEENT:NCAT Neck:Supple without JVD or lymphadenopathy  Heart: RRR Chest:Normal effort Abdomen:Soft and nontender Extremities:No clubbing, cyanosis, or edema. Pulses are 2+  Skin:Clean and intact without signs of breakdown  Neuro:Patient with ongoing attention deficits but displays improved insight and awareness as a whole.  .  Musculoskeletal:normal cervical and shoulder ROM Psych: Patient pleasant but became very anxious when discussing psychosocial issues going on currently..    Assessment & Plan:  1. Hx of TBI (07/28/13) with persistent cognitive and behavioral deficits. Also, she's having ongoing headaches, occasional vertigo, anosmia. -making gradual gains. 2. Hx of T6 compression fracture  3. Agoraphobia per psychology  4. Chronic low back pain.  5. Low back pain  6. 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    Plan:  1. Continue ritalin 10mg  1.5 tabletstid#120. Second RF provided. We will continue the controlled substance monitoring program, this consists of regular clinic visits, examinations, routine drug screening, pill counts as well as use of [place] Cont[place]. [place].   2. Continue topamax 50mg  for headache prophlaxis 3. Continue HEP--not only for physical strength and stamina is less important but also for her mental well-being. 4. Continue counseling per Dr. Leonides CaveZelson which has continued to be very helpful. 5.Xanax 0.25mg  qd prn. #30. Continue low dose klonopin scheduled to help control anxiety and to assist in sleep.   -Spoke with patient and husband today regarding constructive coping strategies and ways to decompress in times of anxiety and stress.  Really her anxiety is her biggest problem at this  point.    7. Follow up with me in about 4months.   15minutes of face to face patient care time were spent during this visit. All questions were encouraged and answered. May pick up ritalin rx'es for months 3-4 in 2 months

## 2017-12-19 ENCOUNTER — Other Ambulatory Visit: Payer: Self-pay | Admitting: Physical Medicine & Rehabilitation

## 2017-12-19 DIAGNOSIS — F411 Generalized anxiety disorder: Secondary | ICD-10-CM

## 2018-02-14 ENCOUNTER — Telehealth: Payer: Self-pay | Admitting: *Deleted

## 2018-02-14 DIAGNOSIS — S069X0S Unspecified intracranial injury without loss of consciousness, sequela: Principal | ICD-10-CM

## 2018-02-14 DIAGNOSIS — F411 Generalized anxiety disorder: Secondary | ICD-10-CM

## 2018-02-14 DIAGNOSIS — F068 Other specified mental disorders due to known physiological condition: Secondary | ICD-10-CM

## 2018-02-14 NOTE — Telephone Encounter (Signed)
Patient needs refill on Ritalin, xanax, and klonopin

## 2018-02-15 MED ORDER — METHYLPHENIDATE HCL 10 MG PO TABS: 15.0000 mg | ORAL_TABLET | Freq: Three times a day (TID) | ORAL | 0 refills | Status: DC

## 2018-02-15 MED ORDER — ALPRAZOLAM 0.25 MG PO TABS: 0.2500 mg | ORAL_TABLET | Freq: Every day | ORAL | 2 refills | Status: DC | PRN

## 2018-02-15 MED ORDER — CLONAZEPAM 0.5 MG PO TABS: 0.5000 mg | ORAL_TABLET | Freq: Two times a day (BID) | ORAL | 2 refills | Status: DC

## 2018-02-15 NOTE — Telephone Encounter (Signed)
Prescriptions sent to the pharmacy.  

## 2018-02-15 NOTE — Telephone Encounter (Signed)
Called patient to inform them of scripts, no answer, left voicemail explaining they have been refilled.

## 2018-03-10 ENCOUNTER — Telehealth: Payer: Self-pay

## 2018-03-10 DIAGNOSIS — F068 Other specified mental disorders due to known physiological condition: Secondary | ICD-10-CM

## 2018-03-10 DIAGNOSIS — S069X0S Unspecified intracranial injury without loss of consciousness, sequela: Principal | ICD-10-CM

## 2018-03-10 NOTE — Telephone Encounter (Signed)
Wanda Warner called to get a refill on patients Ritalin a week ahead as directed. Last filled 02/17/18. Next appt 04/17/18.

## 2018-03-10 NOTE — Telephone Encounter (Signed)
She already has an RX at the pharmacy with a DNF of 03/16/18

## 2018-03-14 NOTE — Telephone Encounter (Signed)
Contacted patient's husband and notified

## 2018-04-17 ENCOUNTER — Encounter: Payer: Self-pay | Admitting: Physical Medicine & Rehabilitation

## 2018-04-17 ENCOUNTER — Encounter: Payer: Medicare Other | Attending: Physical Medicine & Rehabilitation | Admitting: Physical Medicine & Rehabilitation

## 2018-04-17 ENCOUNTER — Other Ambulatory Visit: Payer: Self-pay

## 2018-04-17 VITALS — BP 127/88 | HR 79 | Ht 63.0 in | Wt 160.2 lb

## 2018-04-17 DIAGNOSIS — R42 Dizziness and giddiness: Secondary | ICD-10-CM | POA: Diagnosis not present

## 2018-04-17 DIAGNOSIS — M25512 Pain in left shoulder: Secondary | ICD-10-CM | POA: Diagnosis not present

## 2018-04-17 DIAGNOSIS — F068 Other specified mental disorders due to known physiological condition: Secondary | ICD-10-CM | POA: Diagnosis not present

## 2018-04-17 DIAGNOSIS — F4000 Agoraphobia, unspecified: Secondary | ICD-10-CM | POA: Insufficient documentation

## 2018-04-17 DIAGNOSIS — R51 Headache: Secondary | ICD-10-CM | POA: Diagnosis not present

## 2018-04-17 DIAGNOSIS — Z79899 Other long term (current) drug therapy: Secondary | ICD-10-CM

## 2018-04-17 DIAGNOSIS — Z5181 Encounter for therapeutic drug level monitoring: Secondary | ICD-10-CM | POA: Diagnosis not present

## 2018-04-17 DIAGNOSIS — G8929 Other chronic pain: Secondary | ICD-10-CM | POA: Diagnosis not present

## 2018-04-17 DIAGNOSIS — R4189 Other symptoms and signs involving cognitive functions and awareness: Secondary | ICD-10-CM | POA: Diagnosis not present

## 2018-04-17 DIAGNOSIS — F419 Anxiety disorder, unspecified: Secondary | ICD-10-CM | POA: Insufficient documentation

## 2018-04-17 DIAGNOSIS — G44329 Chronic post-traumatic headache, not intractable: Secondary | ICD-10-CM | POA: Diagnosis not present

## 2018-04-17 DIAGNOSIS — Z8249 Family history of ischemic heart disease and other diseases of the circulatory system: Secondary | ICD-10-CM | POA: Diagnosis not present

## 2018-04-17 DIAGNOSIS — S069X0D Unspecified intracranial injury without loss of consciousness, subsequent encounter: Secondary | ICD-10-CM | POA: Diagnosis not present

## 2018-04-17 DIAGNOSIS — S069X5S Unspecified intracranial injury with loss of consciousness greater than 24 hours with return to pre-existing conscious level, sequela: Secondary | ICD-10-CM | POA: Diagnosis not present

## 2018-04-17 DIAGNOSIS — F411 Generalized anxiety disorder: Secondary | ICD-10-CM

## 2018-04-17 DIAGNOSIS — R41840 Attention and concentration deficit: Secondary | ICD-10-CM

## 2018-04-17 DIAGNOSIS — S069X0S Unspecified intracranial injury without loss of consciousness, sequela: Secondary | ICD-10-CM

## 2018-04-17 DIAGNOSIS — F17210 Nicotine dependence, cigarettes, uncomplicated: Secondary | ICD-10-CM | POA: Diagnosis not present

## 2018-04-17 DIAGNOSIS — Z87820 Personal history of traumatic brain injury: Secondary | ICD-10-CM | POA: Insufficient documentation

## 2018-04-17 DIAGNOSIS — M545 Low back pain: Secondary | ICD-10-CM | POA: Insufficient documentation

## 2018-04-17 DIAGNOSIS — R430 Anosmia: Secondary | ICD-10-CM | POA: Diagnosis not present

## 2018-04-17 MED ORDER — TOPIRAMATE 50 MG PO TABS: 50.0000 mg | ORAL_TABLET | Freq: Every day | ORAL | 4 refills | Status: DC

## 2018-04-17 MED ORDER — METHYLPHENIDATE HCL 10 MG PO TABS: 15.0000 mg | ORAL_TABLET | Freq: Three times a day (TID) | ORAL | 0 refills | Status: DC

## 2018-04-17 MED ORDER — ALPRAZOLAM 0.25 MG PO TABS: 0.2500 mg | ORAL_TABLET | Freq: Every day | ORAL | 2 refills | Status: DC | PRN

## 2018-04-17 MED ORDER — CLONAZEPAM 0.5 MG PO TABS: 0.5000 mg | ORAL_TABLET | Freq: Two times a day (BID) | ORAL | 2 refills | Status: DC

## 2018-04-17 NOTE — Progress Notes (Signed)
Subjective:    Patient ID: Wanda Warner, female    DOB: 01/20/1981, 37 y.o.   MRN: [id number]  HPI   Nivedita is here in follow-up of her traumatic brain injury.  Her biggest complaint has been inconsistent sleep.  She is often napping for 2 hours during the day, sometimes more.  She wakes up in the middle of the night or early morning and then has difficulty falling back asleep.  She feels fatigued frequently during the day.  She ran out of her Ritalin short of this appointment and has not been using it for the last week or so.  That has not helped things.  Anxiety and stress management remain issues for her as well.  Pain Inventory Average Pain 6 Pain Right Now 5 My pain is sharp, dull, stabbing, tingling and aching  In the last 24 hours, has pain interfered with the following? General activity 6 Relation with others 4 Enjoyment of life 5 What TIME of day is your pain at its worst? morning daytime and evening Sleep (in general) NA  Pain is worse with: walking, bending, sitting, inactivity, standing and some activites Pain improves with: na Relief from Meds: 2  Mobility walk without assistance ability to climb steps?  yes do you drive?  no  Function disabled: date disabled . I need assistance with the following:  meal prep, household duties and shopping  Neuro/Psych anxiety loss of taste or smell  Prior Studies Any changes since last visit?  no  Physicians involved in your care Any changes since last visit?  no   Family History  Problem Relation Age of Onset  . Hypertension Father   . Diabetes Maternal Grandmother    Social History   Socioeconomic History  . Marital status: Single    Spouse name: Not on file  . Number of children: Not on file  . Years of education: Not on file  . Highest education level: Not on file  Occupational History  . Not on file  Social Needs  . Financial resource strain: Not on file  . Food insecurity:    Worry: Not on file   Inability: Not on file  . Transportation needs:    Medical: Not on file    Non-medical: Not on file  Tobacco Use  . Smoking status: Current Every Day Smoker    Packs/day: 0.05    Years: 10.00    Pack years: 0.50    Types: Cigarettes  . Smokeless tobacco: Never Used  Substance and Sexual Activity  . Alcohol use: No    Alcohol/week: 1.2 oz    Types: 2 Cans of beer per week  . Drug use: No  . Sexual activity: Yes    Birth control/protection: None  Lifestyle  . Physical activity:    Days per week: Not on file    Minutes per session: Not on file  . Stress: Not on file  Relationships  . Social connections:    Talks on phone: Not on file    Gets together: Not on file    Attends religious service: Not on file    Active member of club or organization: Not on file    Attends meetings of clubs or organizations: Not on file    Relationship status: Not on file  Other Topics Concern  . Not on file  Social History Narrative   Lives in [place].    Hobbies: play solitaire and reading         Past  Surgical History:  Procedure Laterality Date  . CRANIOPLASTY    . CRANIOTOMY    . FRACTURE SURGERY    . INDUCED ABORTION     10/26/11  . ORTHOPEDIC SURGERY     Past Medical History:  Diagnosis Date  . Anxiety   . Arthritis    Knees and back   . Headache   . Loss of smell   . Thoracic compression fracture (HCC)   . Traumatic brain injury (HCC) 07/28/13   BP 127/88   Pulse 79   Ht 5\' 3"  (1.6 m)   Wt 160 lb 3.2 oz (72.7 kg)   SpO2 98%   BMI 28.38 kg/m   Opioid Risk Score:   Fall Risk Score:  `1  Depression screen PHQ 2/9  Depression screen [facility] 2/9 04/17/2018 08/24/2017 06/09/2016 12/09/2015 04/07/2015  Decreased Interest 0 0 0 2 2  Down, Depressed, Hopeless 0 0 0 1 1  PHQ - 2 Score 0 0 0 3 3  Altered sleeping - - - - 1  Tired, decreased energy - - - - 3  Change in appetite - - - - 3  Feeling bad or failure about yourself  - - - - 3  Trouble concentrating - - - - 3    Moving slowly or fidgety/restless - - - - 1  Suicidal thoughts - - - - 0  PHQ-9 Score - - - - 17   Review of Systems  Constitutional: Negative.   HENT: Negative.   Eyes: Negative.   Respiratory: Negative.   Cardiovascular: Negative.   Gastrointestinal: Negative.   Endocrine: Negative.   Genitourinary: Negative.   Musculoskeletal: Negative.   Skin: Negative.   Allergic/Immunologic: Negative.   Neurological: Negative.   Hematological: Negative.   Psychiatric/Behavioral: The patient is nervous/anxious.   All other systems reviewed and are negative.      Objective:   Physical Exam  General: No acute distress HEENT: EOMI, oral membranes moist Cards: reg rate  Chest: normal effort Abdomen: Soft, NT, ND Skin: dry, intact Extremities: no edema Neuro:Ongoing attention issues but improved insight and awareness.  Conversationally she is much improved with language and thought processing. Musculoskeletal:normal cervical and shoulder ROM Psych:  Pleasant as always.  Slightly anxious.    Assessment & Plan:  1. Hx of TBI (07/28/13) with persistent cognitive and behavioral deficits. Also, she's having ongoing headaches, occasional vertigo, anosmia. 2. Hx of T6 compression fracture  3. Agoraphobia per psychology  4. Chronic low back pain.  5. Low back pain  6. 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 Continue ritalin 10mg  1.5 tabletstid#120. second RF provided. We will continue the controlled substance monitoring program, this consists of regular clinic visits, examinations, routine drug screening, pill counts as well as use of [place] Controlled Substance Reporting System. [place].   2. Continue topamax 50mg  for headache prophlaxis 3. Continue HEP--discussed importance of regular exercise for day time energy and metabolism.  4. Discussed importance of improved sleep hygiene. Reviewed strategies to improve 5.Xanax  0.25mg  qd prn. #30. Continue low dose klonopin scheduled to help control anxiety and to assist in sleep.both refilled today            7. Follow up with me in about54months. She may call for ritalin refills for months 3 and 4 in 2 months time.   of face to face patient care time were spent during this visit. All questions were encouraged  and answered.             Assessment & Plan:

## 2018-04-17 NOTE — Patient Instructions (Signed)
1. IMPROVED SLEEP HYGIENE   -GOING TO BED AT CONSISTENT TIMES  -AVOID EXCESSIVE NAPS DURING DAY  -USING MUSIC, READING, QUITE ENVIRONMENT TO HELP WITH SLEEP  -MAINTAIN PHYSICAL ACTIVITY DURING THE DAY

## 2018-06-09 ENCOUNTER — Telehealth: Payer: Self-pay | Admitting: Physical Medicine & Rehabilitation

## 2018-06-09 ENCOUNTER — Telehealth: Payer: Self-pay | Admitting: *Deleted

## 2018-06-09 DIAGNOSIS — F068 Other specified mental disorders due to known physiological condition: Secondary | ICD-10-CM

## 2018-06-09 DIAGNOSIS — S069X0S Unspecified intracranial injury without loss of consciousness, sequela: Principal | ICD-10-CM

## 2018-06-09 MED ORDER — METHYLPHENIDATE HCL 10 MG PO TABS: 15.0000 mg | ORAL_TABLET | Freq: Three times a day (TID) | ORAL | 0 refills | Status: DC

## 2018-06-09 NOTE — Telephone Encounter (Signed)
Ritalin rx'es sent in to pharmacy

## 2018-06-09 NOTE — Telephone Encounter (Signed)
Patients husband called  Requesting next set of refills for ritalin.  I texted Dr. Riley KillSwartz immediately upon receipt of message.  He texted back stating he would take care of it.  I contacted patient's husband and told them Dr. Riley KillSwartz will get it done.  I encouraged them to call us back if problems arise

## 2018-07-10 ENCOUNTER — Telehealth: Payer: Self-pay | Admitting: *Deleted

## 2018-07-10 DIAGNOSIS — F411 Generalized anxiety disorder: Secondary | ICD-10-CM

## 2018-07-10 MED ORDER — CLONAZEPAM 0.5 MG PO TABS: 0.5000 mg | ORAL_TABLET | Freq: Two times a day (BID) | ORAL | 2 refills | Status: DC

## 2018-07-10 NOTE — Telephone Encounter (Signed)
Wanda Warner needs refill on her clonazepam.  Called to pharmacy and [facility] notified.

## 2018-08-01 ENCOUNTER — Encounter: Payer: Self-pay | Admitting: *Deleted

## 2018-08-01 ENCOUNTER — Other Ambulatory Visit: Payer: Self-pay

## 2018-08-01 DIAGNOSIS — F411 Generalized anxiety disorder: Secondary | ICD-10-CM

## 2018-08-01 DIAGNOSIS — S069X0S Unspecified intracranial injury without loss of consciousness, sequela: Secondary | ICD-10-CM

## 2018-08-01 DIAGNOSIS — F068 Other specified mental disorders due to known physiological condition: Secondary | ICD-10-CM

## 2018-08-01 MED ORDER — METHYLPHENIDATE HCL 10 MG PO TABS: 15.0000 mg | ORAL_TABLET | Freq: Three times a day (TID) | ORAL | 0 refills | Status: DC

## 2018-08-01 MED ORDER — ALPRAZOLAM 0.25 MG PO TABS: 0.2500 mg | ORAL_TABLET | Freq: Every day | ORAL | 2 refills | Status: DC | PRN

## 2018-08-01 NOTE — Telephone Encounter (Signed)
Attempted to notify. Mailbox is full and not accepting messages. No other phone number associated with Wanda Warner or Wanda Warner. Email sent through MyChart.

## 2018-08-01 NOTE — Telephone Encounter (Signed)
Done. 2 months sent in

## 2018-08-01 NOTE — Telephone Encounter (Signed)
Wanda Warner called for this patient today, requesting a refill of Xanax and Ritalin for her.  Verified with PMP:  Fill Date               Drug                                              Qty                       Prescriber 07/13/2018          Methylphenidate 10 Mg Tablet      120.00                  Za Swa  06/29/2018          Alprazolam 0.25 Mg Tablet            30.00                   Za Swa

## 2018-08-16 ENCOUNTER — Encounter: Payer: Self-pay | Admitting: Physical Medicine & Rehabilitation

## 2018-08-16 ENCOUNTER — Encounter: Payer: Medicare Other | Attending: Physical Medicine & Rehabilitation | Admitting: Physical Medicine & Rehabilitation

## 2018-08-16 VITALS — BP 135/88 | HR 80 | Ht 63.0 in | Wt 163.0 lb

## 2018-08-16 DIAGNOSIS — F329 Major depressive disorder, single episode, unspecified: Secondary | ICD-10-CM | POA: Insufficient documentation

## 2018-08-16 DIAGNOSIS — Z79899 Other long term (current) drug therapy: Secondary | ICD-10-CM

## 2018-08-16 DIAGNOSIS — F411 Generalized anxiety disorder: Secondary | ICD-10-CM

## 2018-08-16 DIAGNOSIS — Z87820 Personal history of traumatic brain injury: Secondary | ICD-10-CM | POA: Diagnosis not present

## 2018-08-16 DIAGNOSIS — R42 Dizziness and giddiness: Secondary | ICD-10-CM | POA: Diagnosis not present

## 2018-08-16 DIAGNOSIS — M25512 Pain in left shoulder: Secondary | ICD-10-CM | POA: Insufficient documentation

## 2018-08-16 DIAGNOSIS — S069X0S Unspecified intracranial injury without loss of consciousness, sequela: Secondary | ICD-10-CM | POA: Diagnosis not present

## 2018-08-16 DIAGNOSIS — Z8249 Family history of ischemic heart disease and other diseases of the circulatory system: Secondary | ICD-10-CM | POA: Diagnosis not present

## 2018-08-16 DIAGNOSIS — R430 Anosmia: Secondary | ICD-10-CM | POA: Diagnosis not present

## 2018-08-16 DIAGNOSIS — M479 Spondylosis, unspecified: Secondary | ICD-10-CM | POA: Insufficient documentation

## 2018-08-16 DIAGNOSIS — F068 Other specified mental disorders due to known physiological condition: Secondary | ICD-10-CM

## 2018-08-16 DIAGNOSIS — F17210 Nicotine dependence, cigarettes, uncomplicated: Secondary | ICD-10-CM | POA: Diagnosis not present

## 2018-08-16 DIAGNOSIS — R51 Headache: Secondary | ICD-10-CM | POA: Insufficient documentation

## 2018-08-16 DIAGNOSIS — F4000 Agoraphobia, unspecified: Secondary | ICD-10-CM | POA: Diagnosis not present

## 2018-08-16 DIAGNOSIS — M4854XS Collapsed vertebra, not elsewhere classified, thoracic region, sequela of fracture: Secondary | ICD-10-CM | POA: Insufficient documentation

## 2018-08-16 DIAGNOSIS — Z5181 Encounter for therapeutic drug level monitoring: Secondary | ICD-10-CM

## 2018-08-16 DIAGNOSIS — R4189 Other symptoms and signs involving cognitive functions and awareness: Secondary | ICD-10-CM | POA: Insufficient documentation

## 2018-08-16 DIAGNOSIS — M170 Bilateral primary osteoarthritis of knee: Secondary | ICD-10-CM | POA: Insufficient documentation

## 2018-08-16 DIAGNOSIS — M545 Low back pain: Secondary | ICD-10-CM | POA: Insufficient documentation

## 2018-08-16 DIAGNOSIS — G8929 Other chronic pain: Secondary | ICD-10-CM | POA: Insufficient documentation

## 2018-08-16 DIAGNOSIS — R439 Unspecified disturbances of smell and taste: Secondary | ICD-10-CM | POA: Insufficient documentation

## 2018-08-16 DIAGNOSIS — F419 Anxiety disorder, unspecified: Secondary | ICD-10-CM | POA: Insufficient documentation

## 2018-08-16 MED ORDER — ESCITALOPRAM OXALATE 10 MG PO TABS: 10.0000 mg | ORAL_TABLET | Freq: Every day | ORAL | 3 refills | Status: DC

## 2018-08-16 NOTE — Patient Instructions (Signed)
PLEASE FEEL FREE TO CALL OUR OFFICE WITH ANY PROBLEMS OR QUESTIONS [phone])     I WANT YOU TO BE PERFORMING SOME TYPE OF EXERCISE EACH DAY!

## 2018-08-16 NOTE — Progress Notes (Signed)
Subjective:    Patient ID: Wanda Warner, female    DOB: 1981/04/13, 37 y.o.   MRN: [id number]  HPI -  10/ 10/325 [name] is here in follow-up of her traumatic brain injury and associated cognitive and behavioral issues. Stress continues to be a problem for Vercie due to multiple pschosocial issues. Her mother n law has been living with them at home which has raised things to another level.   She is also having pain in his mid back and low back pain.  She remains on klonopin for anxiety as well as prn xanax. The ritalin also helps her from breaking down during periods of physical or social stress. Husband is supportive but says he can only protect her so much.   She is not working out as much as she had been because she is complaining of low back tightness as well as tightness around her shoulders.  She feels all bound up.  Asked her if it felt that her anxiety was contributing to this and she agreed.  She is afraid however to exercise when she hurts.   Pain Inventory Average Pain 6 Pain Right Now 5 My pain is sharp, stabbing and aching  In the last 24 hours, has pain interfered with the following? General activity 5 Relation with others 5 Enjoyment of life 5 What TIME of day is your pain at its worst? daytime Sleep (in general) Fair  Pain is worse with: walking, bending, sitting, inactivity, standing and some activites Pain improves with: . Relief from Meds: .  Mobility walk without assistance ability to climb steps?  yes do you drive?  no  Function disabled: date disabled . I need assistance with the following:  meal prep, household duties and shopping  Neuro/Psych anxiety loss of taste or smell  Prior Studies Any changes since last visit?  no  Physicians involved in your care Any changes since last visit?  no   Family History  Problem Relation Age of Onset  . Hypertension Father   . Diabetes Maternal Grandmother    Social History   Socioeconomic History  .  Marital status: Single    Spouse name: Not on file  . Number of children: Not on file  . Years of education: Not on file  . Highest education level: Not on file  Occupational History  . Not on file  Social Needs  . Financial resource strain: Not on file  . Food insecurity:    Worry: Not on file    Inability: Not on file  . Transportation needs:    Medical: Not on file    Non-medical: Not on file  Tobacco Use  . Smoking status: Current Every Day Smoker    Packs/day: 0.05    Years: 10.00    Pack years: 0.50    Types: Cigarettes  . Smokeless tobacco: Never Used  Substance and Sexual Activity  . Alcohol use: No    Alcohol/week: 2.0 standard drinks    Types: 2 Cans of beer per week  . Drug use: No  . Sexual activity: Yes    Birth control/protection: None  Lifestyle  . Physical activity:    Days per week: Not on file    Minutes per session: Not on file  . Stress: Not on file  Relationships  . Social connections:    Talks on phone: Not on file    Gets together: Not on file    Attends religious service: Not on file  Active member of club or organization: Not on file    Attends meetings of clubs or organizations: Not on file    Relationship status: Not on file  Other Topics Concern  . Not on file  Social History Narrative   Lives in [place].    Hobbies: play solitaire and reading         Past Surgical History:  Procedure Laterality Date  . CRANIOPLASTY    . CRANIOTOMY    . FRACTURE SURGERY    . INDUCED ABORTION     10/26/11  . ORTHOPEDIC SURGERY     Past Medical History:  Diagnosis Date  . Anxiety   . Arthritis    Knees and back   . Headache   . Loss of smell   . Thoracic compression fracture (HCC)   . Traumatic brain injury (HCC) 07/28/13   BP 135/88   Pulse 80   Ht 5\' 3"  (1.6 m)   Wt 163 lb (73.9 kg)   SpO2 98%   BMI 28.87 kg/m    Opioid Risk Score:   Fall Risk Score:  `1  Depression screen PHQ 2/9  Depression screen [facility] 2/9 04/17/2018  08/24/2017 06/09/2016 12/09/2015 04/07/2015  Decreased Interest 0 0 0 2 2  Down, Depressed, Hopeless 0 0 0 1 1  PHQ - 2 Score 0 0 0 3 3  Altered sleeping - - - - 1  Tired, decreased energy - - - - 3  Change in appetite - - - - 3  Feeling bad or failure about yourself  - - - - 3  Trouble concentrating - - - - 3  Moving slowly or fidgety/restless - - - - 1  Suicidal thoughts - - - - 0  PHQ-9 Score - - - - 17     Review of Systems  Constitutional: Negative.   HENT: Negative.   Eyes: Negative.   Respiratory: Negative.   Cardiovascular: Negative.   Gastrointestinal: Negative.   Endocrine: Negative.   Genitourinary: Negative.   Musculoskeletal: Positive for back pain.  Skin: Negative.   Allergic/Immunologic: Negative.   Neurological: Negative.   Hematological: Negative.   Psychiatric/Behavioral: The patient is nervous/anxious.   All other systems reviewed and are negative.      Objective:   Physical Exam  General: No acute distress HEENT: EOMI, oral membranes moist Cards: reg rate  Chest: normal effort Abdomen: Soft, NT, ND Skin: dry, intact Extremities: no edema Neuro:Patient with persistent attention deficits but displays improved insight and awareness as a whole..  Musculoskeletal:Cervical range of motion is functional.  She is able to bend at the lumbar spine to about 90 degrees.  Some pain noted with extension Psych:  anxious, especially describing social isues    Assessment & Plan:  1. Hx of TBI (07/28/13) with persistent cognitive and behavioral deficits. Also, she's having ongoing headaches, occasional vertigo, anosmia. -making gradual gains. 2. Hx of T6 compression fracture  3. Agoraphobia per psychology  4. Chronic low back pain.  5. Low back pain  6. 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    Plan:  1. Continue ritalin 10mg  1.5 tabletstid#120.  Doesn't need RF today.  We will continue the controlled substance monitoring  program, this consists of regular clinic visits, examinations, routine drug screening, pill counts as well as use of [place] Controlled Substance Reporting System. [place] today.   -UDS today 2. Continue topamax 50mg  for headache prophlaxis 3. Continue HEP--not only for physical strength and  stamina is less important but also for her mental well-being. 4. Made a referral to Dr. Kieth Brightly to help with anxiety disorder.  5.Xanax 0.25mg  qd prn. #30. Continue low dose klonopin scheduled to help control anxiety and to assist in sleep---no dose changes today 6. Begin trial of lexapro 10mg  qhs for anxiety/depression  7. Follow up with me in about50months.   15 minutes of face to face patient care time were spent during this visit. All questions were encouraged and answered.

## 2018-08-21 LAB — TOXASSURE SELECT,+ANTIDEPR,UR

## 2018-08-22 ENCOUNTER — Telehealth: Payer: Self-pay | Admitting: *Deleted

## 2018-08-22 NOTE — Telephone Encounter (Signed)
Urine drug screen for this encounter is consistent for prescribed medication 

## 2018-09-22 ENCOUNTER — Telehealth: Payer: Self-pay | Admitting: *Deleted

## 2018-09-22 DIAGNOSIS — S069X5S Unspecified intracranial injury with loss of consciousness greater than 24 hours with return to pre-existing conscious level, sequela: Secondary | ICD-10-CM

## 2018-09-22 DIAGNOSIS — Z5181 Encounter for therapeutic drug level monitoring: Secondary | ICD-10-CM

## 2018-09-22 DIAGNOSIS — S069X0D Unspecified intracranial injury without loss of consciousness, subsequent encounter: Secondary | ICD-10-CM

## 2018-09-22 DIAGNOSIS — F068 Other specified mental disorders due to known physiological condition: Secondary | ICD-10-CM

## 2018-09-22 DIAGNOSIS — Z79899 Other long term (current) drug therapy: Secondary | ICD-10-CM

## 2018-09-22 DIAGNOSIS — R41840 Attention and concentration deficit: Secondary | ICD-10-CM

## 2018-09-22 DIAGNOSIS — S069XAS Unspecified intracranial injury with loss of consciousness status unknown, sequela: Secondary | ICD-10-CM

## 2018-09-22 DIAGNOSIS — F411 Generalized anxiety disorder: Secondary | ICD-10-CM

## 2018-09-22 DIAGNOSIS — S069X0S Unspecified intracranial injury without loss of consciousness, sequela: Principal | ICD-10-CM

## 2018-09-22 MED ORDER — METHYLPHENIDATE HCL 10 MG PO TABS: 15.0000 mg | ORAL_TABLET | Freq: Three times a day (TID) | ORAL | 0 refills | Status: DC

## 2018-09-22 MED ORDER — TOPIRAMATE 50 MG PO TABS: 50.0000 mg | ORAL_TABLET | Freq: Every day | ORAL | 4 refills | Status: DC

## 2018-09-22 MED ORDER — CLONAZEPAM 0.5 MG PO TABS: 0.5000 mg | ORAL_TABLET | Freq: Two times a day (BID) | ORAL | 2 refills | Status: DC

## 2018-09-22 NOTE — Telephone Encounter (Signed)
Patient's husband called asking for refills on ritalin, klonopin, and topamax.  He called a little earlier than normal because he recognizes that Dr. Riley KillSwartz will probably be taking time off for the holidays

## 2018-09-22 NOTE — Telephone Encounter (Signed)
done

## 2018-10-16 ENCOUNTER — Encounter: Payer: Self-pay | Admitting: Physical Medicine & Rehabilitation

## 2018-10-16 ENCOUNTER — Encounter: Payer: Medicare Other | Attending: Physical Medicine & Rehabilitation | Admitting: Physical Medicine & Rehabilitation

## 2018-10-16 VITALS — BP 115/80 | HR 93 | Ht 65.0 in | Wt 164.0 lb

## 2018-10-16 DIAGNOSIS — Z87820 Personal history of traumatic brain injury: Secondary | ICD-10-CM | POA: Diagnosis not present

## 2018-10-16 DIAGNOSIS — F419 Anxiety disorder, unspecified: Secondary | ICD-10-CM | POA: Insufficient documentation

## 2018-10-16 DIAGNOSIS — G44329 Chronic post-traumatic headache, not intractable: Secondary | ICD-10-CM

## 2018-10-16 DIAGNOSIS — M4854XS Collapsed vertebra, not elsewhere classified, thoracic region, sequela of fracture: Secondary | ICD-10-CM | POA: Diagnosis not present

## 2018-10-16 DIAGNOSIS — M25512 Pain in left shoulder: Secondary | ICD-10-CM | POA: Insufficient documentation

## 2018-10-16 DIAGNOSIS — F068 Other specified mental disorders due to known physiological condition: Secondary | ICD-10-CM

## 2018-10-16 DIAGNOSIS — F4000 Agoraphobia, unspecified: Secondary | ICD-10-CM | POA: Insufficient documentation

## 2018-10-16 DIAGNOSIS — G8929 Other chronic pain: Secondary | ICD-10-CM | POA: Insufficient documentation

## 2018-10-16 DIAGNOSIS — R4189 Other symptoms and signs involving cognitive functions and awareness: Secondary | ICD-10-CM

## 2018-10-16 DIAGNOSIS — Z8249 Family history of ischemic heart disease and other diseases of the circulatory system: Secondary | ICD-10-CM | POA: Insufficient documentation

## 2018-10-16 DIAGNOSIS — F411 Generalized anxiety disorder: Secondary | ICD-10-CM | POA: Diagnosis not present

## 2018-10-16 DIAGNOSIS — M545 Low back pain: Secondary | ICD-10-CM | POA: Diagnosis not present

## 2018-10-16 DIAGNOSIS — M479 Spondylosis, unspecified: Secondary | ICD-10-CM | POA: Diagnosis not present

## 2018-10-16 DIAGNOSIS — R51 Headache: Secondary | ICD-10-CM | POA: Diagnosis not present

## 2018-10-16 DIAGNOSIS — R42 Dizziness and giddiness: Secondary | ICD-10-CM | POA: Diagnosis not present

## 2018-10-16 DIAGNOSIS — R439 Unspecified disturbances of smell and taste: Secondary | ICD-10-CM | POA: Diagnosis not present

## 2018-10-16 DIAGNOSIS — S069X0S Unspecified intracranial injury without loss of consciousness, sequela: Secondary | ICD-10-CM

## 2018-10-16 DIAGNOSIS — S069X0D Unspecified intracranial injury without loss of consciousness, subsequent encounter: Secondary | ICD-10-CM

## 2018-10-16 DIAGNOSIS — M170 Bilateral primary osteoarthritis of knee: Secondary | ICD-10-CM | POA: Insufficient documentation

## 2018-10-16 DIAGNOSIS — F329 Major depressive disorder, single episode, unspecified: Secondary | ICD-10-CM | POA: Diagnosis not present

## 2018-10-16 DIAGNOSIS — R430 Anosmia: Secondary | ICD-10-CM | POA: Insufficient documentation

## 2018-10-16 DIAGNOSIS — Z5181 Encounter for therapeutic drug level monitoring: Secondary | ICD-10-CM | POA: Diagnosis not present

## 2018-10-16 DIAGNOSIS — F17210 Nicotine dependence, cigarettes, uncomplicated: Secondary | ICD-10-CM | POA: Insufficient documentation

## 2018-10-16 DIAGNOSIS — S069X5S Unspecified intracranial injury with loss of consciousness greater than 24 hours with return to pre-existing conscious level, sequela: Secondary | ICD-10-CM

## 2018-10-16 DIAGNOSIS — Z79899 Other long term (current) drug therapy: Secondary | ICD-10-CM | POA: Diagnosis not present

## 2018-10-16 DIAGNOSIS — R41840 Attention and concentration deficit: Secondary | ICD-10-CM

## 2018-10-16 MED ORDER — ALPRAZOLAM 0.25 MG PO TABS: 0.2500 mg | ORAL_TABLET | Freq: Every day | ORAL | 2 refills | Status: DC | PRN

## 2018-10-16 MED ORDER — METHYLPHENIDATE HCL 10 MG PO TABS: 15.0000 mg | ORAL_TABLET | Freq: Three times a day (TID) | ORAL | 0 refills | Status: DC

## 2018-10-16 MED ORDER — ESCITALOPRAM OXALATE 5 MG PO TABS: 5.0000 mg | ORAL_TABLET | Freq: Every day | ORAL | 2 refills | Status: DC

## 2018-10-16 NOTE — Patient Instructions (Addendum)
BREAK LEXAPRO IN HALF TO 5MG . TAKE IN THE DAY IF YOU NEED TO.

## 2018-10-16 NOTE — Progress Notes (Signed)
Subjective:    Patient ID: Wanda Warner, female    DOB: 1981-08-02, 38 y.o.   MRN: [id number]  HPI   Azaline is here in follow-up of her traumatic brain injury and associated cognitive and behavioral deficits.  For the most part she has been doing fairly well.  We started her on Lexapro at last visit, 10 mg nightly.  She is not sure that the medication has helped her and she feels that sometimes she is groggy in the morning after taking it.  Also she feels that she has a hard time falling asleep when she first takes it at night.  Anxiety remains an issue to her and she is unsure that the Lexapro has helped at this point.  She still uses Xanax as needed as well as Klonopin scheduled twice daily.  She is on Topamax at night and uses the Ritalin during the day for attention and concentration.  Pain Inventory Average Pain 5 Pain Right Now 5 My pain is sharp, stabbing and aching  In the last 24 hours, has pain interfered with the following? General activity 6 Relation with others 4 Enjoyment of life 5 What TIME of day is your pain at its worst? daytime Sleep (in general) Fair  Pain is worse with: walking, bending, sitting, inactivity, standing and some activites Pain improves with: na Relief from Meds: na  Mobility do you drive?  no  Function disabled: date disabled 2014  Neuro/Psych anxiety loss of taste or smell  Prior Studies Any changes since last visit?  no  Physicians involved in your care Any changes since last visit?  no   Family History  Problem Relation Age of Onset  . Hypertension Father   . Diabetes Maternal Grandmother    Social History   Socioeconomic History  . Marital status: Single    Spouse name: Not on file  . Number of children: Not on file  . Years of education: Not on file  . Highest education level: Not on file  Occupational History  . Not on file  Social Needs  . Financial resource strain: Not on file  . Food insecurity:    Worry: Not on  file    Inability: Not on file  . Transportation needs:    Medical: Not on file    Non-medical: Not on file  Tobacco Use  . Smoking status: Current Every Day Smoker    Packs/day: 0.05    Years: 10.00    Pack years: 0.50    Types: Cigarettes  . Smokeless tobacco: Never Used  Substance and Sexual Activity  . Alcohol use: No    Alcohol/week: 2.0 standard drinks    Types: 2 Cans of beer per week  . Drug use: No  . Sexual activity: Yes    Birth control/protection: None  Lifestyle  . Physical activity:    Days per week: Not on file    Minutes per session: Not on file  . Stress: Not on file  Relationships  . Social connections:    Talks on phone: Not on file    Gets together: Not on file    Attends religious service: Not on file    Active member of club or organization: Not on file    Attends meetings of clubs or organizations: Not on file    Relationship status: Not on file  Other Topics Concern  . Not on file  Social History Narrative   Lives in [place].    Hobbies: play solitaire  and reading         Past Surgical History:  Procedure Laterality Date  . CRANIOPLASTY    . CRANIOTOMY    . FRACTURE SURGERY    . INDUCED ABORTION     10/26/11  . ORTHOPEDIC SURGERY     Past Medical History:  Diagnosis Date  . Anxiety   . Arthritis    Knees and back   . Headache   . Loss of smell   . Thoracic compression fracture (HCC)   . Traumatic brain injury (HCC) 07/28/13   BP 115/80   Pulse 93   Ht 5\' 5"  (1.651 m)   Wt 164 lb (74.4 kg)   LMP 09/21/2018   SpO2 98%   BMI 27.29 kg/m   Opioid Risk Score:   Fall Risk Score:  `1  Depression screen PHQ 2/9  [facility] 04/17/2018 08/24/2017 06/09/2016 12/09/2015 04/07/2015  Decreased Interest 0 0 0 2 2  Down, Depressed, Hopeless 0 0 0 1 1  PHQ - 2 Score 0 0 0 3 3  Altered sleeping - - - - 1  Tired, decreased energy - - - - 3  Change in appetite - - - - 3  Feeling bad or failure about yourself  - - - - 3    Trouble concentrating - - - - 3  Moving slowly or fidgety/restless - - - - 1  Suicidal thoughts - - - - 0  PHQ-9 Score - - - - 17     Review of Systems  Constitutional: Negative.   HENT: Negative.   Eyes: Negative.   Respiratory: Negative.   Cardiovascular: Negative.   Gastrointestinal: Negative.   Endocrine: Negative.   Genitourinary: Negative.   Musculoskeletal: Positive for arthralgias, back pain and myalgias.  Skin: Negative.   Allergic/Immunologic: Negative.   Neurological: Negative.   Hematological: Negative.   Psychiatric/Behavioral: The patient is nervous/anxious.   All other systems reviewed and are negative.      Objective:   Physical Exam  General: No acute distress HEENT: EOMI, oral membranes moist Cards: reg rate  Chest: normal effort Abdomen: Soft, NT, ND Skin: dry, intact Extremities: no edema  Neuro:Patient with persistent attention deficits but displays improved insight and awareness as a whole..  Musculoskeletal:Cervical range of motion is functional.  She is able to bend at the lumbar spine to about 90 degrees.  Knee range of motion functionsl.  Psych:  anxious, especially describing social isues    Assessment & Plan:  1. Hx of TBI (07/28/13) with persistent cognitive and behavioral deficits. Also, she's having ongoing headaches, occasional vertigo, anosmia. -making gradual gains. 2. Hx of T6 compression fracture  3. Agoraphobia per psychology  4. Chronic low back pain.  5. Low back pain  6. 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   Plan:  1. Continue ritalin10mg  1.5 tabletstid#120.  We will continue the controlled substance monitoring program, this consists of regular clinic visits, examinations, routine drug screening, pill counts as well as use [place]. [place] today.   -Medication was refilled for next month was sent to the patient's pharmacy  for next month.   2. Continue topamax 50mg  for headache prophlaxis.  No refills today 3. Continued HEP as we have described. 4.  Pending referral to Dr. Kieth Brightlyodenbough to help with anxiety disorder.  5.Xanax 0.25mg  qd prn. #30. Continue low dose klonopin scheduled to help control anxiety and to assist in sleep---no dose  changes today.  Both medications refilled today 6. Will reduce lexapro to 5mg  qhs to see if this helps with daytime fatigue. She can also try taking it during the day if she feels it's stimulatory  7. Follow up with me in about44months.   15 minutes of face to face patient care time were spent during this visit. All questions were encouraged and answered.

## 2018-10-26 ENCOUNTER — Encounter (HOSPITAL_BASED_OUTPATIENT_CLINIC_OR_DEPARTMENT_OTHER): Payer: Medicare Other | Admitting: Psychology

## 2018-10-26 DIAGNOSIS — S069X0S Unspecified intracranial injury without loss of consciousness, sequela: Secondary | ICD-10-CM | POA: Diagnosis not present

## 2018-10-26 DIAGNOSIS — R42 Dizziness and giddiness: Secondary | ICD-10-CM | POA: Diagnosis not present

## 2018-10-26 DIAGNOSIS — R430 Anosmia: Secondary | ICD-10-CM | POA: Diagnosis not present

## 2018-10-26 DIAGNOSIS — R41840 Attention and concentration deficit: Secondary | ICD-10-CM

## 2018-10-26 DIAGNOSIS — R4189 Other symptoms and signs involving cognitive functions and awareness: Secondary | ICD-10-CM | POA: Diagnosis not present

## 2018-10-26 DIAGNOSIS — F411 Generalized anxiety disorder: Secondary | ICD-10-CM

## 2018-10-26 DIAGNOSIS — R51 Headache: Secondary | ICD-10-CM | POA: Diagnosis not present

## 2018-10-26 DIAGNOSIS — G44329 Chronic post-traumatic headache, not intractable: Secondary | ICD-10-CM

## 2018-10-26 DIAGNOSIS — F068 Other specified mental disorders due to known physiological condition: Secondary | ICD-10-CM | POA: Diagnosis not present

## 2018-10-26 DIAGNOSIS — Z87820 Personal history of traumatic brain injury: Secondary | ICD-10-CM | POA: Diagnosis not present

## 2018-10-26 DIAGNOSIS — M4854XS Collapsed vertebra, not elsewhere classified, thoracic region, sequela of fracture: Secondary | ICD-10-CM | POA: Diagnosis not present

## 2018-12-03 ENCOUNTER — Encounter: Payer: Self-pay | Admitting: Psychology
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
## 2018-12-08 ENCOUNTER — Encounter: Payer: Medicare Other | Attending: Physical Medicine & Rehabilitation | Admitting: Psychology

## 2018-12-08 DIAGNOSIS — G44329 Chronic post-traumatic headache, not intractable: Secondary | ICD-10-CM | POA: Diagnosis not present

## 2018-12-08 DIAGNOSIS — Z87820 Personal history of traumatic brain injury: Secondary | ICD-10-CM | POA: Diagnosis not present

## 2018-12-08 DIAGNOSIS — R42 Dizziness and giddiness: Secondary | ICD-10-CM | POA: Diagnosis not present

## 2018-12-08 DIAGNOSIS — S069X0S Unspecified intracranial injury without loss of consciousness, sequela: Secondary | ICD-10-CM

## 2018-12-08 DIAGNOSIS — M170 Bilateral primary osteoarthritis of knee: Secondary | ICD-10-CM | POA: Insufficient documentation

## 2018-12-08 DIAGNOSIS — Z8249 Family history of ischemic heart disease and other diseases of the circulatory system: Secondary | ICD-10-CM | POA: Diagnosis not present

## 2018-12-08 DIAGNOSIS — F17210 Nicotine dependence, cigarettes, uncomplicated: Secondary | ICD-10-CM | POA: Diagnosis not present

## 2018-12-08 DIAGNOSIS — R41840 Attention and concentration deficit: Secondary | ICD-10-CM

## 2018-12-08 DIAGNOSIS — M4854XS Collapsed vertebra, not elsewhere classified, thoracic region, sequela of fracture: Secondary | ICD-10-CM | POA: Diagnosis not present

## 2018-12-08 DIAGNOSIS — R439 Unspecified disturbances of smell and taste: Secondary | ICD-10-CM | POA: Diagnosis not present

## 2018-12-08 DIAGNOSIS — R430 Anosmia: Secondary | ICD-10-CM | POA: Insufficient documentation

## 2018-12-08 DIAGNOSIS — R51 Headache: Secondary | ICD-10-CM | POA: Diagnosis not present

## 2018-12-08 DIAGNOSIS — R4189 Other symptoms and signs involving cognitive functions and awareness: Secondary | ICD-10-CM | POA: Diagnosis not present

## 2018-12-08 DIAGNOSIS — F329 Major depressive disorder, single episode, unspecified: Secondary | ICD-10-CM | POA: Insufficient documentation

## 2018-12-08 DIAGNOSIS — M479 Spondylosis, unspecified: Secondary | ICD-10-CM | POA: Diagnosis not present

## 2018-12-08 DIAGNOSIS — G8929 Other chronic pain: Secondary | ICD-10-CM | POA: Diagnosis not present

## 2018-12-08 DIAGNOSIS — F419 Anxiety disorder, unspecified: Secondary | ICD-10-CM | POA: Insufficient documentation

## 2018-12-08 DIAGNOSIS — M25512 Pain in left shoulder: Secondary | ICD-10-CM | POA: Insufficient documentation

## 2018-12-08 DIAGNOSIS — F068 Other specified mental disorders due to known physiological condition: Secondary | ICD-10-CM

## 2018-12-08 DIAGNOSIS — F4000 Agoraphobia, unspecified: Secondary | ICD-10-CM | POA: Diagnosis not present

## 2018-12-08 DIAGNOSIS — M545 Low back pain: Secondary | ICD-10-CM | POA: Insufficient documentation

## 2018-12-08 DIAGNOSIS — F411 Generalized anxiety disorder: Secondary | ICD-10-CM

## 2018-12-10 ENCOUNTER — Encounter: Payer: Self-pay | Admitting: Psychology
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
## 2018-12-19 ENCOUNTER — Telehealth: Payer: Self-pay | Admitting: *Deleted

## 2018-12-19 DIAGNOSIS — F068 Other specified mental disorders due to known physiological condition: Secondary | ICD-10-CM

## 2018-12-19 DIAGNOSIS — S069X0S Unspecified intracranial injury without loss of consciousness, sequela: Principal | ICD-10-CM

## 2018-12-19 NOTE — Telephone Encounter (Signed)
Wanda Warner needs a refill on her Ritalin.

## 2018-12-20 MED ORDER — METHYLPHENIDATE HCL 10 MG PO TABS: 15.0000 mg | ORAL_TABLET | Freq: Three times a day (TID) | ORAL | 0 refills | Status: DC

## 2018-12-20 NOTE — Telephone Encounter (Signed)
2 mos rx written

## 2018-12-20 NOTE — Telephone Encounter (Signed)
Wanda Warner notified. 

## 2019-01-01 ENCOUNTER — Encounter: Payer: Medicare Other | Admitting: Psychology

## 2019-01-15 ENCOUNTER — Encounter: Payer: Medicare Other | Attending: Physical Medicine & Rehabilitation | Admitting: Psychology

## 2019-01-15 ENCOUNTER — Other Ambulatory Visit: Payer: Self-pay | Admitting: Physical Medicine & Rehabilitation

## 2019-01-15 DIAGNOSIS — S069X0S Unspecified intracranial injury without loss of consciousness, sequela: Secondary | ICD-10-CM

## 2019-01-15 DIAGNOSIS — F329 Major depressive disorder, single episode, unspecified: Secondary | ICD-10-CM | POA: Insufficient documentation

## 2019-01-15 DIAGNOSIS — R4189 Other symptoms and signs involving cognitive functions and awareness: Secondary | ICD-10-CM | POA: Insufficient documentation

## 2019-01-15 DIAGNOSIS — Z8249 Family history of ischemic heart disease and other diseases of the circulatory system: Secondary | ICD-10-CM | POA: Insufficient documentation

## 2019-01-15 DIAGNOSIS — F411 Generalized anxiety disorder: Secondary | ICD-10-CM

## 2019-01-15 DIAGNOSIS — F419 Anxiety disorder, unspecified: Secondary | ICD-10-CM | POA: Insufficient documentation

## 2019-01-15 DIAGNOSIS — M479 Spondylosis, unspecified: Secondary | ICD-10-CM | POA: Insufficient documentation

## 2019-01-15 DIAGNOSIS — R439 Unspecified disturbances of smell and taste: Secondary | ICD-10-CM | POA: Insufficient documentation

## 2019-01-15 DIAGNOSIS — R430 Anosmia: Secondary | ICD-10-CM | POA: Insufficient documentation

## 2019-01-15 DIAGNOSIS — R51 Headache: Secondary | ICD-10-CM | POA: Insufficient documentation

## 2019-01-15 DIAGNOSIS — R42 Dizziness and giddiness: Secondary | ICD-10-CM | POA: Insufficient documentation

## 2019-01-15 DIAGNOSIS — M545 Low back pain: Secondary | ICD-10-CM | POA: Insufficient documentation

## 2019-01-15 DIAGNOSIS — F068 Other specified mental disorders due to known physiological condition: Secondary | ICD-10-CM

## 2019-01-15 DIAGNOSIS — F4000 Agoraphobia, unspecified: Secondary | ICD-10-CM | POA: Insufficient documentation

## 2019-01-15 DIAGNOSIS — M25512 Pain in left shoulder: Secondary | ICD-10-CM | POA: Insufficient documentation

## 2019-01-15 DIAGNOSIS — M170 Bilateral primary osteoarthritis of knee: Secondary | ICD-10-CM | POA: Insufficient documentation

## 2019-01-15 DIAGNOSIS — F17210 Nicotine dependence, cigarettes, uncomplicated: Secondary | ICD-10-CM | POA: Insufficient documentation

## 2019-01-15 DIAGNOSIS — M4854XS Collapsed vertebra, not elsewhere classified, thoracic region, sequela of fracture: Secondary | ICD-10-CM | POA: Insufficient documentation

## 2019-01-15 DIAGNOSIS — Z87820 Personal history of traumatic brain injury: Secondary | ICD-10-CM | POA: Insufficient documentation

## 2019-01-15 DIAGNOSIS — G8929 Other chronic pain: Secondary | ICD-10-CM | POA: Insufficient documentation

## 2019-02-14 ENCOUNTER — Encounter: Payer: Medicare Other | Attending: Physical Medicine & Rehabilitation | Admitting: Physical Medicine & Rehabilitation

## 2019-02-14 ENCOUNTER — Other Ambulatory Visit: Payer: Self-pay

## 2019-02-14 ENCOUNTER — Encounter: Payer: Self-pay | Admitting: Physical Medicine & Rehabilitation

## 2019-02-14 VITALS — Ht 63.0 in

## 2019-02-14 DIAGNOSIS — G44329 Chronic post-traumatic headache, not intractable: Secondary | ICD-10-CM | POA: Diagnosis not present

## 2019-02-14 DIAGNOSIS — R42 Dizziness and giddiness: Secondary | ICD-10-CM | POA: Insufficient documentation

## 2019-02-14 DIAGNOSIS — M4854XS Collapsed vertebra, not elsewhere classified, thoracic region, sequela of fracture: Secondary | ICD-10-CM | POA: Insufficient documentation

## 2019-02-14 DIAGNOSIS — F411 Generalized anxiety disorder: Secondary | ICD-10-CM

## 2019-02-14 DIAGNOSIS — Z8249 Family history of ischemic heart disease and other diseases of the circulatory system: Secondary | ICD-10-CM | POA: Insufficient documentation

## 2019-02-14 DIAGNOSIS — M545 Low back pain: Secondary | ICD-10-CM | POA: Insufficient documentation

## 2019-02-14 DIAGNOSIS — S069X0S Unspecified intracranial injury without loss of consciousness, sequela: Secondary | ICD-10-CM

## 2019-02-14 DIAGNOSIS — M170 Bilateral primary osteoarthritis of knee: Secondary | ICD-10-CM | POA: Insufficient documentation

## 2019-02-14 DIAGNOSIS — F329 Major depressive disorder, single episode, unspecified: Secondary | ICD-10-CM | POA: Insufficient documentation

## 2019-02-14 DIAGNOSIS — F419 Anxiety disorder, unspecified: Secondary | ICD-10-CM | POA: Insufficient documentation

## 2019-02-14 DIAGNOSIS — F4000 Agoraphobia, unspecified: Secondary | ICD-10-CM | POA: Insufficient documentation

## 2019-02-14 DIAGNOSIS — F068 Other specified mental disorders due to known physiological condition: Secondary | ICD-10-CM | POA: Diagnosis not present

## 2019-02-14 DIAGNOSIS — R4189 Other symptoms and signs involving cognitive functions and awareness: Secondary | ICD-10-CM | POA: Insufficient documentation

## 2019-02-14 DIAGNOSIS — R430 Anosmia: Secondary | ICD-10-CM | POA: Insufficient documentation

## 2019-02-14 DIAGNOSIS — M479 Spondylosis, unspecified: Secondary | ICD-10-CM | POA: Insufficient documentation

## 2019-02-14 DIAGNOSIS — G8929 Other chronic pain: Secondary | ICD-10-CM | POA: Insufficient documentation

## 2019-02-14 DIAGNOSIS — Z87820 Personal history of traumatic brain injury: Secondary | ICD-10-CM | POA: Insufficient documentation

## 2019-02-14 DIAGNOSIS — R51 Headache: Secondary | ICD-10-CM | POA: Insufficient documentation

## 2019-02-14 DIAGNOSIS — M25512 Pain in left shoulder: Secondary | ICD-10-CM | POA: Insufficient documentation

## 2019-02-14 DIAGNOSIS — R439 Unspecified disturbances of smell and taste: Secondary | ICD-10-CM | POA: Insufficient documentation

## 2019-02-14 DIAGNOSIS — F17210 Nicotine dependence, cigarettes, uncomplicated: Secondary | ICD-10-CM | POA: Insufficient documentation

## 2019-02-14 MED ORDER — METHYLPHENIDATE HCL 10 MG PO TABS: 15.0000 mg | ORAL_TABLET | Freq: Three times a day (TID) | ORAL | 0 refills | Status: DC

## 2019-02-14 MED ORDER — CLONAZEPAM 0.5 MG PO TABS: 0.5000 mg | ORAL_TABLET | Freq: Two times a day (BID) | ORAL | 3 refills | Status: DC

## 2019-02-14 NOTE — Progress Notes (Signed)
Subjective:    Patient ID: Wanda Warner, female    DOB: May 10, 1981, 38 y.o.   MRN: [id number]  HPI   Due to national recommendations of social distancing because of COVID 16, an audio/video tele-health visit is felt to be the most appropriate encounter for this patient at this time. See MyChart message from today for the patient's consent to a tele-health encounter with [facility]. This is a follow up telephone visit for the patient who is at home. MD is at office.    I am meeting with the patient today regarding her TBI and associated deficits.  She tells me that she has been trying to do some more exercise at home in the context of what is going on with coronavirus.  She does find that she does more she will tend to be sore the following day.  She has worked on trying to build up her quads and calfs and they typically are quite sore when she is done.  She still feels that she has fatigue and she is concerned about her weight.  She is trying to eat less in order to help her weight but still is not having a lot of success with weight loss yet.  She is on Lexapro 5 mg daily but has not noticed much change with her fatigue levels.  Her mood is been generally stable even with the stresses going on around her.  She also takes Topamax at bedtime for headaches as well as ibuprofen for pain.  She is on Ritalin 15 mg 3 times daily and she uses Klonopin 0.5 mg 2 times daily.  Pain Inventory Average Pain 6 Pain Right Now 5 My pain is constant, sharp and pinching  In the last 24 hours, has pain interfered with the following? General activity 5 Relation with others 5 Enjoyment of life 5 What TIME of day is your pain at its worst? varies Sleep (in general) Good  Pain is worse with: walking, bending, sitting, inactivity, standing, unsure and some activites Pain improves with: none Relief from Meds: 0  Mobility how many minutes can you walk? 15 ability to climb  steps?  yes do you drive?  no  Function disabled: date disabled na I need assistance with the following:  meal prep and shopping  Neuro/Psych depression  Prior Studies Any changes since last visit?  no  Physicians involved in your care Any changes since last visit?  no   Family History  Problem Relation Age of Onset  . Hypertension Father   . Diabetes Maternal Grandmother    Social History   Socioeconomic History  . Marital status: Single    Spouse name: Not on file  . Number of children: Not on file  . Years of education: Not on file  . Highest education level: Not on file  Occupational History  . Not on file  Social Needs  . Financial resource strain: Not on file  . Food insecurity:    Worry: Not on file    Inability: Not on file  . Transportation needs:    Medical: Not on file    Non-medical: Not on file  Tobacco Use  . Smoking status: Current Every Day Smoker    Packs/day: 0.05    Years: 10.00    Pack years: 0.50    Types: Cigarettes  . Smokeless tobacco: Never Used  Substance and Sexual Activity  . Alcohol use: No    Alcohol/week: 2.0 standard drinks  Types: 2 Cans of beer per week  . Drug use: No  . Sexual activity: Yes    Birth control/protection: None  Lifestyle  . Physical activity:    Days per week: Not on file    Minutes per session: Not on file  . Stress: Not on file  Relationships  . Social connections:    Talks on phone: Not on file    Gets together: Not on file    Attends religious service: Not on file    Active member of club or organization: Not on file    Attends meetings of clubs or organizations: Not on file    Relationship status: Not on file  Other Topics Concern  . Not on file  Social History Narrative   Lives in [place].    Hobbies: play solitaire and reading         Past Surgical History:  Procedure Laterality Date  . CRANIOPLASTY    . CRANIOTOMY    . FRACTURE SURGERY    . INDUCED ABORTION     10/26/11  .  ORTHOPEDIC SURGERY     Past Medical History:  Diagnosis Date  . Anxiety   . Arthritis    Knees and back   . Headache   . Loss of smell   . Thoracic compression fracture (HCC)   . Traumatic brain injury (HCC) 07/28/13   Ht 5\' 3"  (1.6 m)   BMI 29.05 kg/m   Opioid Risk Score:   Fall Risk Score:  `1  Depression screen PHQ 2/9  [facility] 02/14/2019 04/17/2018 08/24/2017 06/09/2016 12/09/2015 04/07/2015  Decreased Interest 1 0 0 0 2 2  Down, Depressed, Hopeless 1 0 0 0 1 1  PHQ - 2 Score 2 0 0 0 3 3  Altered sleeping - - - - - 1  Tired, decreased energy - - - - - 3  Change in appetite - - - - - 3  Feeling bad or failure about yourself  - - - - - 3  Trouble concentrating - - - - - 3  Moving slowly or fidgety/restless - - - - - 1  Suicidal thoughts - - - - - 0  PHQ-9 Score - - - - - 17    Review of Systems  Constitutional: Negative.   HENT: Negative.   Eyes: Negative.   Respiratory: Negative.   Cardiovascular: Negative.   Gastrointestinal: Negative.   Endocrine: Negative.   Genitourinary: Negative.   Musculoskeletal: Positive for back pain.  Skin: Negative.   Allergic/Immunologic: Negative.   Neurological: Negative.   Hematological: Negative.   Psychiatric/Behavioral: Positive for dysphoric mood. The patient is nervous/anxious.   All other systems reviewed and are negative.           Assessment & Plan:  1. Hx of TBI (07/28/13) with persistent cognitive and behavioral deficits. Also, she's having ongoing headaches, occasional vertigo, anosmia. -making gradual gains. 2. Hx of T6 compression fracture  3. Agoraphobia per psychology  4. Chronic low back pain.  5. Low back pain  6. 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   Plan:  1. Continue ritalin10mg  1.5 tabletstid#120.  We will continue the controlled substance monitoring program, this consists of regular clinic visits, examinations, routine drug screening, pill counts  as well as use of [place] Controlled Substance Reporting System. [place] today.  .  -Medication was refilled and a second prescription was sent to the patient's pharmacy for next month.  2. Continue topamax 50mg  for headache prophlaxis.  No refills today 3. Continued HEP as we have described.previously. I would like her to work through some of the physical and emotional barriers which tend to limit her physical activity. 4.  Pending televisit with Dr. Kieth Brightly to help with anxiety .  5.Xanax 0.25mg  qd prn. #30.  Continue low dose klonopin scheduled to help control anxiety and to assist in sleep---no dose changes today.   6. Will dc lexapro d/t weight gain and fatigue 7. Reviewed the role of dietary choices in fatigue/weight as well.    11 minutes of tele-visit time was spent with this patient today. Follow up in 4 months

## 2019-03-21 ENCOUNTER — Other Ambulatory Visit: Payer: Self-pay | Admitting: Physical Medicine & Rehabilitation

## 2019-03-21 DIAGNOSIS — S069X5S Unspecified intracranial injury with loss of consciousness greater than 24 hours with return to pre-existing conscious level, sequela: Secondary | ICD-10-CM

## 2019-03-21 DIAGNOSIS — S069X0S Unspecified intracranial injury without loss of consciousness, sequela: Secondary | ICD-10-CM

## 2019-03-21 DIAGNOSIS — S069X0D Unspecified intracranial injury without loss of consciousness, subsequent encounter: Secondary | ICD-10-CM

## 2019-03-21 DIAGNOSIS — Z5181 Encounter for therapeutic drug level monitoring: Secondary | ICD-10-CM

## 2019-03-21 DIAGNOSIS — R41840 Attention and concentration deficit: Secondary | ICD-10-CM

## 2019-03-21 DIAGNOSIS — Z79899 Other long term (current) drug therapy: Secondary | ICD-10-CM

## 2019-03-21 DIAGNOSIS — F411 Generalized anxiety disorder: Secondary | ICD-10-CM

## 2019-03-21 DIAGNOSIS — F068 Other specified mental disorders due to known physiological condition: Secondary | ICD-10-CM

## 2019-04-03 ENCOUNTER — Telehealth: Payer: Self-pay | Admitting: *Deleted

## 2019-04-03 DIAGNOSIS — F068 Other specified mental disorders due to known physiological condition: Secondary | ICD-10-CM

## 2019-04-03 NOTE — Telephone Encounter (Signed)
Wanda Warner is calling for her Ritalin to be refilled.  Her next appointment is not until September 2.

## 2019-04-04 MED ORDER — METHYLPHENIDATE HCL 10 MG PO TABS: 15.0000 mg | ORAL_TABLET | Freq: Three times a day (TID) | ORAL | 0 refills | Status: DC

## 2019-04-04 NOTE — Telephone Encounter (Signed)
2 rx'es sent

## 2019-06-13 ENCOUNTER — Telehealth: Payer: Self-pay

## 2019-06-13 ENCOUNTER — Encounter: Payer: Medicare HMO | Admitting: Physical Medicine & Rehabilitation

## 2019-06-13 DIAGNOSIS — F068 Other specified mental disorders due to known physiological condition: Secondary | ICD-10-CM

## 2019-06-13 NOTE — Telephone Encounter (Signed)
Spoke to [employer] Velva Harman gave permission) explained office visit for med type /specialist etc.  He said that's fine if you have to dc--- I spoke with mD --then called Shane back.  He said he will let her have over night to calm down and call back AM.  Please call in 14 days RX - as she is out - and you gave 2 weeks to present. She ran out yesterday.He will call back in AM

## 2019-06-14 ENCOUNTER — Telehealth: Payer: Self-pay | Admitting: *Deleted

## 2019-06-14 DIAGNOSIS — F411 Generalized anxiety disorder: Secondary | ICD-10-CM

## 2019-06-14 MED ORDER — METHYLPHENIDATE HCL 10 MG PO TABS: 15.0000 mg | ORAL_TABLET | Freq: Three times a day (TID) | ORAL | 0 refills | Status: DC

## 2019-06-14 MED ORDER — CLONAZEPAM 0.5 MG PO TABS: 0.5000 mg | ORAL_TABLET | Freq: Two times a day (BID) | ORAL | 0 refills | Status: DC

## 2019-06-14 NOTE — Telephone Encounter (Signed)
Requesting the bridge for Wanda Warner's clonazepam. 2 week supply called to the pharmacy (#28)

## 2019-06-14 NOTE — Telephone Encounter (Signed)
PMP was reviewed, Methylphenidate e-scribed, two week supply.

## 2019-06-14 NOTE — Telephone Encounter (Signed)
Thanks for follow up. She's not to receive any further medication without a visit and UDS.  thanks

## 2019-06-25 ENCOUNTER — Encounter: Payer: Medicare HMO | Attending: Registered Nurse | Admitting: Registered Nurse

## 2019-06-26 ENCOUNTER — Telehealth: Payer: Self-pay | Admitting: Physical Medicine & Rehabilitation

## 2019-06-26 NOTE — Telephone Encounter (Signed)
Pt Fiance called 06/26/19. Patient did not show up for her appointment on 06/25/19 with Zella Ball. She was supposed to come to office for a visit within two weeks. We offered her an appointment on 06/27/19 with you, they declined due to transportation.This is her two week period. Lattie Haw has details as to why she did not show up for her appointment if you would like details. Do you want her to be rescheduled or discharged? Please advise

## 2019-06-27 ENCOUNTER — Encounter: Payer: Self-pay | Admitting: Physical Medicine & Rehabilitation

## 2019-06-27 ENCOUNTER — Other Ambulatory Visit: Payer: Self-pay

## 2019-06-27 ENCOUNTER — Encounter (HOSPITAL_BASED_OUTPATIENT_CLINIC_OR_DEPARTMENT_OTHER): Payer: Medicare HMO | Admitting: Physical Medicine & Rehabilitation

## 2019-06-27 DIAGNOSIS — R4189 Other symptoms and signs involving cognitive functions and awareness: Secondary | ICD-10-CM

## 2019-06-27 DIAGNOSIS — F068 Other specified mental disorders due to known physiological condition: Secondary | ICD-10-CM | POA: Diagnosis not present

## 2019-06-27 DIAGNOSIS — F411 Generalized anxiety disorder: Secondary | ICD-10-CM | POA: Diagnosis not present

## 2019-06-27 DIAGNOSIS — S069X0S Unspecified intracranial injury without loss of consciousness, sequela: Secondary | ICD-10-CM

## 2019-06-27 MED ORDER — ALPRAZOLAM 0.25 MG PO TABS: 0.2500 mg | ORAL_TABLET | Freq: Every day | ORAL | 0 refills | Status: DC | PRN

## 2019-06-27 MED ORDER — CLONAZEPAM 0.5 MG PO TABS: 0.5000 mg | ORAL_TABLET | Freq: Two times a day (BID) | ORAL | 0 refills | Status: DC

## 2019-06-27 MED ORDER — METHYLPHENIDATE HCL 10 MG PO TABS: 15.0000 mg | ORAL_TABLET | Freq: Three times a day (TID) | ORAL | 0 refills | Status: DC

## 2019-06-27 NOTE — Progress Notes (Signed)
**Note Wanda-Identified via Obfuscation** Subjective:    Patient ID: Wanda Warner, female    DOB: 1981/01/19, 38 y.o.   MRN: [id number]  HPI   Due to national recommendations of social distancing because of COVID 3719, an audio/video tele-health visit is felt to be the most appropriate encounter for this patient at this time. See MyChart message from today for the patient's consent to a tele-health encounter with [facility]. This is a follow up telephone visit for the patient who is at home. MD is at office.    I am meeting with [place] SinkRita today regarding her traumatic brain injury and associated issues.  It has been since January that we last saw her in person due to COVID.  There is some issues with her making recent appointments as well.  Patient states that there have been issues with transportation which prevented her from coming this week and previously.  She remains a bit stir crazy related to COVID and social distancing. Her biggest complaint is her low back. It has been painful with standing and activities in general where she's on her feet. The pain is usually central with occasional pain into the buttocks. The pains is intermittent.   Pain Inventory Average Pain 6 Pain Right Now 6 My pain is intermittent, sharp, stabbing, aching and pinching  In the last 24 hours, has pain interfered with the following? General activity 5 Relation with others 5 Enjoyment of life 5 What TIME of day is your pain at its worst? daytime Sleep (in general) Poor  Pain is worse with: bending, standing and some activites Pain improves with: na Relief from Meds: na  Mobility walk without assistance ability to climb steps?  yes do you drive?  no  Function disabled: date disabled 2014 I need assistance with the following:  meal prep, household duties and shopping  Neuro/Psych anxiety  Prior Studies Any changes since last visit?  no  Physicians involved in your care Any changes since last visit?  no    Family History  Problem Relation Age of Onset  . Hypertension Father   . Diabetes Maternal Grandmother    Social History   Socioeconomic History  . Marital status: Single    Spouse name: Not on file  . Number of children: Not on file  . Years of education: Not on file  . Highest education level: Not on file  Occupational History  . Not on file  Social Needs  . Financial resource strain: Not on file  . Food insecurity    Worry: Not on file    Inability: Not on file  . Transportation needs    Medical: Not on file    Non-medical: Not on file  Tobacco Use  . Smoking status: Current Every Day Smoker    Packs/day: 0.05    Years: 10.00    Pack years: 0.50    Types: Cigarettes  . Smokeless tobacco: Never Used  Substance and Sexual Activity  . Alcohol use: No    Alcohol/week: 2.0 standard drinks    Types: 2 Cans of beer per week  . Drug use: No  . Sexual activity: Yes    Birth control/protection: None  Lifestyle  . Physical activity    Days per week: Not on file    Minutes per session: Not on file  . Stress: Not on file  Relationships  . Social Musicianconnections    Talks on phone: Not on file    Gets together: Not on file  Attends religious service: Not on file    Active member of club or organization: Not on file    Attends meetings of clubs or organizations: Not on file    Relationship status: Not on file  Other Topics Concern  . Not on file  Social History Narrative   Lives in [place].    Hobbies: play solitaire and reading         Past Surgical History:  Procedure Laterality Date  . CRANIOPLASTY    . CRANIOTOMY    . FRACTURE SURGERY    . INDUCED ABORTION     10/26/11  . ORTHOPEDIC SURGERY     Past Medical History:  Diagnosis Date  . Anxiety   . Arthritis    Knees and back   . Headache   . Loss of smell   . Thoracic compression fracture ([place])   . Traumatic brain injury ([place]) 07/28/13   There were no vitals taken for this visit.  Opioid Risk  Score:   Fall Risk Score:  `1  Depression screen PHQ 2/9  Depression screen [facility] 2/9 02/14/2019 04/17/2018 08/24/2017 06/09/2016 12/09/2015 04/07/2015  Decreased Interest 1 0 0 0 2 2  Down, Depressed, Hopeless 1 0 0 0 1 1  PHQ - 2 Score 2 0 0 0 3 3  Altered sleeping - - - - - 1  Tired, decreased energy - - - - - 3  Change in appetite - - - - - 3  Feeling bad or failure about yourself  - - - - - 3  Trouble concentrating - - - - - 3  Moving slowly or fidgety/restless - - - - - 1  Suicidal thoughts - - - - - 0  PHQ-9 Score - - - - - 17     Review of Systems  Constitutional: Negative.   HENT: Negative.   Eyes: Negative.   Respiratory: Negative.   Cardiovascular: Negative.   Gastrointestinal: Negative.   Endocrine: Negative.   Genitourinary: Negative.   Musculoskeletal: Positive for arthralgias, back pain and myalgias.  Skin: Negative.   Allergic/Immunologic: Negative.   Neurological: Negative.   Hematological: Negative.   Psychiatric/Behavioral: The patient is nervous/anxious.   All other systems reviewed and are negative.      Objective:   Physical Exam        Assessment & Plan:  1. Hx of TBI (07/28/13) with persistent cognitive and behavioral deficits. Also, she's having ongoing headaches, occasional vertigo, anosmia. -making gradual gains. 2. Hx of T6 compression fracture  3. Agoraphobia per psychology  4. Chronic low back pain.  5. 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   Plan:  1. Continue ritalin10mg  1.5 tabletstid#120.  We will continue the controlled substance monitoring program, this consists of regular clinic visits, examinations, routine drug screening, pill counts as well as use of [place] Controlled Substance Reporting System. [place] was reviewed today.       2. Continue topamax 50mg  for headache prophlaxis.No refills today 3.Continued HEP including hamstring stretches and flexion exercises. ?MRI 4.Needs follow  up Dr. Sima Matas for ongoinganxiety .  5.Xanax 0.25mg  qd prn. #30.  Continue low dose klonopin scheduled to help control anxiety and to assist in sleep---no dose changes today.  6.off lexapro d/t weight gain .   12 minutes of tele-visit time was spent with this patient today. Follow up in 1 months with me

## 2019-07-25 ENCOUNTER — Encounter: Payer: Medicare HMO | Attending: Registered Nurse | Admitting: Physical Medicine & Rehabilitation

## 2019-07-25 ENCOUNTER — Other Ambulatory Visit: Payer: Self-pay

## 2019-07-25 ENCOUNTER — Encounter: Payer: Self-pay | Admitting: Physical Medicine & Rehabilitation

## 2019-07-25 VITALS — BP 116/76 | HR 86 | Temp 97.5°F | Ht 64.0 in | Wt 179.0 lb

## 2019-07-25 DIAGNOSIS — Z79899 Other long term (current) drug therapy: Secondary | ICD-10-CM

## 2019-07-25 DIAGNOSIS — S069X5S Unspecified intracranial injury with loss of consciousness greater than 24 hours with return to pre-existing conscious level, sequela: Secondary | ICD-10-CM

## 2019-07-25 DIAGNOSIS — F068 Other specified mental disorders due to known physiological condition: Secondary | ICD-10-CM | POA: Diagnosis not present

## 2019-07-25 DIAGNOSIS — Z79891 Long term (current) use of opiate analgesic: Secondary | ICD-10-CM

## 2019-07-25 DIAGNOSIS — Z5181 Encounter for therapeutic drug level monitoring: Secondary | ICD-10-CM

## 2019-07-25 DIAGNOSIS — S069X0S Unspecified intracranial injury without loss of consciousness, sequela: Secondary | ICD-10-CM

## 2019-07-25 DIAGNOSIS — R4189 Other symptoms and signs involving cognitive functions and awareness: Secondary | ICD-10-CM

## 2019-07-25 DIAGNOSIS — G894 Chronic pain syndrome: Secondary | ICD-10-CM | POA: Diagnosis not present

## 2019-07-25 DIAGNOSIS — S069X0D Unspecified intracranial injury without loss of consciousness, subsequent encounter: Secondary | ICD-10-CM

## 2019-07-25 DIAGNOSIS — R41840 Attention and concentration deficit: Secondary | ICD-10-CM

## 2019-07-25 DIAGNOSIS — F411 Generalized anxiety disorder: Secondary | ICD-10-CM

## 2019-07-25 MED ORDER — METHYLPHENIDATE HCL 10 MG PO TABS: 15.0000 mg | ORAL_TABLET | Freq: Three times a day (TID) | ORAL | 0 refills | Status: DC

## 2019-07-25 MED ORDER — CLONAZEPAM 0.5 MG PO TABS: 0.5000 mg | ORAL_TABLET | Freq: Two times a day (BID) | ORAL | 0 refills | Status: DC

## 2019-07-25 MED ORDER — TOPIRAMATE 50 MG PO TABS: ORAL_TABLET | ORAL | 4 refills | Status: DC

## 2019-07-25 MED ORDER — ALPRAZOLAM 0.25 MG PO TABS: 0.2500 mg | ORAL_TABLET | Freq: Every day | ORAL | 0 refills | Status: DC | PRN

## 2019-07-25 NOTE — Progress Notes (Signed)
**Note Wanda-Identified via Obfuscation** Subjective:    Patient ID: Wanda Warner, female    DOB: 1981-08-02, 38 y.o.   MRN: [id number]  HPI   [place] SinkRita is here in follow up of her TBI and chronic associated deficits.  She has ongoing issues related to her anxiety which have been made worse with everything going on with COVID.  Additionally she lost her dog of 11 years which was a rate of sunshine for her typically.  Her husband is having some health issues of her own.  She feels that the anxiety has affected her sleep and has made her feel tired a lot of the time.  Aside from that she has been fairly stable from a cognitive standpoint.  Pain levels are consistent as well.   Pain Inventory Average Pain 5 Pain Right Now 5 My pain is sharp, burning, stabbing, tingling and aching  In the last 24 hours, has pain interfered with the following? General activity 3 Relation with others 4 Enjoyment of life 4 What TIME of day is your pain at its worst? morning, daytime, evening Sleep (in general) Good  Pain is worse with: walking, bending, sitting and inactivity Pain improves with: medication Relief from Meds: 3  Mobility walk without assistance Do you have any goals in this area?  yes  Function I need assistance with the following:  meal prep, household duties and shopping Do you have any goals in this area?  yes  Neuro/Psych anxiety loss of taste or smell  Prior Studies Any changes since last visit?  no  Physicians involved in your care Any changes since last visit?  no   Family History  Problem Relation Age of Onset  . Hypertension Father   . Diabetes Maternal Grandmother    Social History   Socioeconomic History  . Marital status: Single    Spouse name: Not on file  . Number of children: Not on file  . Years of education: Not on file  . Highest education level: Not on file  Occupational History  . Not on file  Social Needs  . Financial resource strain: Not on file  . Food insecurity    Worry: Not on file     Inability: Not on file  . Transportation needs    Medical: Not on file    Non-medical: Not on file  Tobacco Use  . Smoking status: Current Every Day Smoker    Packs/day: 0.05    Years: 10.00    Pack years: 0.50    Types: Cigarettes  . Smokeless tobacco: Never Used  Substance and Sexual Activity  . Alcohol use: No    Alcohol/week: 2.0 standard drinks    Types: 2 Cans of beer per week  . Drug use: No  . Sexual activity: Yes    Birth control/protection: None  Lifestyle  . Physical activity    Days per week: Not on file    Minutes per session: Not on file  . Stress: Not on file  Relationships  . Social Musicianconnections    Talks on phone: Not on file    Gets together: Not on file    Attends religious service: Not on file    Active member of club or organization: Not on file    Attends meetings of clubs or organizations: Not on file    Relationship status: Not on file  Other Topics Concern  . Not on file  Social History Narrative   Lives in [place].    Hobbies: play solitaire and reading  Past Surgical History:  Procedure Laterality Date  . CRANIOPLASTY    . CRANIOTOMY    . FRACTURE SURGERY    . INDUCED ABORTION     10/26/11  . ORTHOPEDIC SURGERY     Past Medical History:  Diagnosis Date  . Anxiety   . Arthritis    Knees and back   . Headache   . Loss of smell   . Thoracic compression fracture (HCC)   . Traumatic brain injury (HCC) 07/28/13   BP 116/76   Pulse 86   Temp (!) 97.5 F (36.4 C)   Ht 5\' 4"  (1.626 m)   Wt 179 lb (81.2 kg)   SpO2 98%   BMI 30.73 kg/m   Opioid Risk Score:   Fall Risk Score:  `1  Depression screen PHQ 2/9  Depression screen [facility] 2/9 02/14/2019 04/17/2018 08/24/2017 06/09/2016 12/09/2015 04/07/2015  Decreased Interest 1 0 0 0 2 2  Down, Depressed, Hopeless 1 0 0 0 1 1  PHQ - 2 Score 2 0 0 0 3 3  Altered sleeping - - - - - 1  Tired, decreased energy - - - - - 3  Change in appetite - - - - - 3  Feeling bad or failure about  yourself  - - - - - 3  Trouble concentrating - - - - - 3  Moving slowly or fidgety/restless - - - - - 1  Suicidal thoughts - - - - - 0  PHQ-9 Score - - - - - 17    Review of Systems  Constitutional: Negative.   HENT: Negative.   Eyes: Negative.   Respiratory: Negative.   Cardiovascular: Negative.   Gastrointestinal: Negative.   Endocrine: Negative.   Genitourinary: Negative.   Musculoskeletal: Negative.   Skin: Negative.   Allergic/Immunologic: Negative.   Neurological: Negative.   Hematological: Negative.   Psychiatric/Behavioral: Positive for decreased concentration. The patient is nervous/anxious.   All other systems reviewed and are negative.      Objective:   Physical Exam Gen: no distress, normal appearing HEENT: oral mucosa pink and moist, NCAT Cardio: Reg rate Chest: normal effort, normal rate of breathing Abd: soft, non-distended Ext: no edema Skin: intact Neuro: functional attention. Still distracted. Reasonable balance Musculoskeletal: funcitonal ROM Psych: pleasant, normal affect       Assessment & Plan:  1. Hx of TBI (07/28/13) with persistent cognitive and behavioral deficits. Also, she's having ongoing headaches, occasional vertigo, anosmia. -making gradual gains. 2. Hx of T6 compression fracture  3. Agoraphobia per psychology  4. Chronic low back pain.  5. 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   Plan:  1. Continue ritalin10mg  1.5 tabletstid#120.  We will continue the controlled substance monitoring program, this consists of regular clinic visits, examinations, routine drug screening, pill counts as well as use of 07/30/13 Controlled Substance Reporting System. [place] was [place] today.      -UDS today  2. Continue topamax 50mg  for headache prophlaxis.No refills today 3.Continued HEP including hamstring stretches and flexion exercises. ?MRI 4.Needs follow upDr.Rodenbough for ongoinganxiety . I  made another referral today.  Encouraged her and her husband to look at another pet for companionship and emotional release.  They both seem to enjoy their last dog quite a bit. 5.Xanax 0.25mg  qd prn. #30.  Continue low dose klonopin scheduled to assist with anxiety and to assist in sleep---no dose changes today.     Fifteen minutes of face to face patient care time were  spent during this visit. All questions were encouraged and answered.  Follow up with me in 4 months .

## 2019-07-25 NOTE — Patient Instructions (Signed)
PLEASE FEEL FREE TO CALL OUR OFFICE WITH ANY PROBLEMS OR QUESTIONS ([phone])      

## 2019-07-29 LAB — TOXASSURE SELECT,+ANTIDEPR,UR

## 2019-07-30 ENCOUNTER — Telehealth: Payer: Self-pay | Admitting: *Deleted

## 2019-07-30 NOTE — Telephone Encounter (Signed)
Urine drug screen for this encounter is consistent for prescribed medication 

## 2019-08-21 ENCOUNTER — Other Ambulatory Visit: Payer: Self-pay

## 2019-08-21 DIAGNOSIS — S069X0S Unspecified intracranial injury without loss of consciousness, sequela: Secondary | ICD-10-CM

## 2019-08-21 DIAGNOSIS — F068 Other specified mental disorders due to known physiological condition: Secondary | ICD-10-CM

## 2019-08-21 DIAGNOSIS — F411 Generalized anxiety disorder: Secondary | ICD-10-CM

## 2019-08-21 NOTE — Telephone Encounter (Signed)
Patients fiance called and requested refills for the following medications, Xanax, Ritalin, and Klonopin.

## 2019-08-22 MED ORDER — CLONAZEPAM 0.5 MG PO TABS: 0.5000 mg | ORAL_TABLET | Freq: Two times a day (BID) | ORAL | 1 refills | Status: DC

## 2019-08-22 MED ORDER — ALPRAZOLAM 0.25 MG PO TABS: 0.2500 mg | ORAL_TABLET | Freq: Every day | ORAL | 1 refills | Status: DC | PRN

## 2019-08-22 MED ORDER — METHYLPHENIDATE HCL 10 MG PO TABS: 15.0000 mg | ORAL_TABLET | Freq: Three times a day (TID) | ORAL | 0 refills | Status: DC

## 2019-08-22 NOTE — Telephone Encounter (Signed)
Meds RF'ed. Second RX for December written

## 2019-09-14 ENCOUNTER — Telehealth: Payer: Self-pay

## 2019-09-14 DIAGNOSIS — S069X0S Unspecified intracranial injury without loss of consciousness, sequela: Secondary | ICD-10-CM

## 2019-09-14 DIAGNOSIS — F068 Other specified mental disorders due to known physiological condition: Secondary | ICD-10-CM

## 2019-09-14 NOTE — Telephone Encounter (Signed)
Wanda Warner called for patient stating she will need refill on Methylphenidate soon. He states the last 2 months has only been a 27 day supply. Last filled 08/22/2019 #108. Next appt 11/28/2019

## 2019-09-14 NOTE — Telephone Encounter (Signed)
But the second rx I wrote her said DNF b/f 09/20/2019......Marland Kitchen

## 2019-09-17 NOTE — Telephone Encounter (Addendum)
Noted something troubling in the original message.  Patient stated:  last 2 months has only been a 27 day supply. Last filled 08/22/2019 #108  Verified patients claim with PMP website and noted that they are speaking correctly on the amounts dispensed despite the prescriptions being written for 135 tablets on each prescriptions with the acceptance of the one written for 2 weeks in September by Zella Ball.  08/22/2019 1 08/22/2019 Methylphenidate 10 Mg Tablet  108.00 30 Za Swa 07/25/2019 1 07/25/2019 Methylphenidate 10 Mg Tablet  108.00 30 Za Swa 06/27/2019 1 06/27/2019 Methylphenidate 10 Mg Tablet  108.00 30 Za Swa 06/14/2019 1 06/14/2019 Methylphenidate 10 Mg Tablet  64.00  [facility] at [phone] for clarification on why they are dispensing less then what has been ordered by the provider.  After speaking with the pharmacist, it was stated that the reason that the ammount of the medication was lessened was due to the insurance company not exceeding a quantity limit of the medication and that a quantity limit exception prior authorization will need to be completed.    It is being recorded that the patient could have been saved a large amount of trouble and stress had any form of notification of this need had been recieved by [facility] that this prior authorization was needed.  Conducting prior auth today 09-17-2019

## 2019-09-19 NOTE — Telephone Encounter (Signed)
Checked today status of both prior [facility], clonazepam and methylphenidate,  still pending 09-19-2019 / 815am

## 2019-09-20 NOTE — Telephone Encounter (Signed)
Methylphidate Approved through 10/10/2020  Clonazepam Approved throught 10/10/2020

## 2019-10-18 ENCOUNTER — Telehealth: Payer: Self-pay | Admitting: *Deleted

## 2019-10-18 DIAGNOSIS — F068 Other specified mental disorders due to known physiological condition: Secondary | ICD-10-CM

## 2019-10-18 DIAGNOSIS — F411 Generalized anxiety disorder: Secondary | ICD-10-CM

## 2019-10-18 NOTE — Telephone Encounter (Signed)
Mr Dewaine Conger called for refills on Ms Mcclish's methylphenidate and alprazolam/clonazepam.

## 2019-10-19 MED ORDER — ALPRAZOLAM 0.25 MG PO TABS: 0.2500 mg | ORAL_TABLET | Freq: Every day | ORAL | 2 refills | Status: DC | PRN

## 2019-10-19 MED ORDER — METHYLPHENIDATE HCL 10 MG PO TABS: 15.0000 mg | ORAL_TABLET | Freq: Three times a day (TID) | ORAL | 0 refills | Status: DC

## 2019-10-19 MED ORDER — CLONAZEPAM 0.5 MG PO TABS: 0.5000 mg | ORAL_TABLET | Freq: Two times a day (BID) | ORAL | 2 refills | Status: DC

## 2019-10-19 NOTE — Telephone Encounter (Signed)
Mr Dewaine Conger notified.

## 2019-10-19 NOTE — Telephone Encounter (Signed)
meds refilled including ritalin for Feb

## 2019-11-28 ENCOUNTER — Encounter: Payer: Medicare HMO | Attending: Physical Medicine & Rehabilitation | Admitting: Physical Medicine & Rehabilitation

## 2019-11-28 ENCOUNTER — Encounter: Payer: Self-pay | Admitting: Physical Medicine & Rehabilitation

## 2019-11-28 ENCOUNTER — Other Ambulatory Visit: Payer: Self-pay

## 2019-11-28 VITALS — BP 117/78 | HR 84 | Temp 97.9°F | Ht 64.0 in | Wt 180.0 lb

## 2019-11-28 DIAGNOSIS — F411 Generalized anxiety disorder: Secondary | ICD-10-CM | POA: Diagnosis not present

## 2019-11-28 DIAGNOSIS — F908 Attention-deficit hyperactivity disorder, other type: Secondary | ICD-10-CM | POA: Insufficient documentation

## 2019-11-28 DIAGNOSIS — G44329 Chronic post-traumatic headache, not intractable: Secondary | ICD-10-CM | POA: Diagnosis not present

## 2019-11-28 DIAGNOSIS — F068 Other specified mental disorders due to known physiological condition: Secondary | ICD-10-CM | POA: Insufficient documentation

## 2019-11-28 DIAGNOSIS — S069X0S Unspecified intracranial injury without loss of consciousness, sequela: Secondary | ICD-10-CM | POA: Diagnosis not present

## 2019-11-28 DIAGNOSIS — Z5181 Encounter for therapeutic drug level monitoring: Secondary | ICD-10-CM | POA: Diagnosis not present

## 2019-11-28 DIAGNOSIS — Z79891 Long term (current) use of opiate analgesic: Secondary | ICD-10-CM | POA: Insufficient documentation

## 2019-11-28 MED ORDER — METHYLPHENIDATE HCL 10 MG PO TABS: 15.0000 mg | ORAL_TABLET | Freq: Three times a day (TID) | ORAL | 0 refills | Status: DC

## 2019-11-28 NOTE — Addendum Note (Signed)
Addended by: Angela Nevin D on: 11/28/2019 09:38 AM   Modules accepted: Orders

## 2019-11-28 NOTE — Progress Notes (Signed)
Subjective:    Patient ID: Wanda Warner, female    DOB: 02-14-1981, 39 y.o.   MRN: [id number]  HPI  Wanda Warner is here in follow up of her TBI. She has been maintaining as best as she can at home. She is doing better emotionally but anxiety is an ongoing issue. They are getting a new dog which she's really happy about. She plans to go on walks with it and is happy to have for emotional support.   She remains on ritalin for attention. She uses klonopin and xanax for anxiety. She wants to see Dr. Kieth Brightly again but has been hesitant d/t covid.     Pain Inventory Average Pain 5 Pain Right Now 5 My pain is sharp, burning, dull, stabbing, tingling and aching  In the last 24 hours, has pain interfered with the following? General activity 7 Relation with others 6 Enjoyment of life 6 What TIME of day is your pain at its worst? all Sleep (in general) Poor  Pain is worse with: unsure Pain improves with: medication Relief from Meds: 3  Mobility walk without assistance how many minutes can you walk? 5 ability to climb steps?  yes do you drive?  yes Do you have any goals in this area?  yes  Function I need assistance with the following:  meal prep, household duties and shopping Do you have any goals in this area?  yes  Neuro/Psych anxiety  Prior Studies Any changes since last visit?  no  Physicians involved in your care Any changes since last visit?  no   Family History  Problem Relation Age of Onset  . Hypertension Father   . Diabetes Maternal Grandmother    Social History   Socioeconomic History  . Marital status: Single    Spouse name: Not on file  . Number of children: Not on file  . Years of education: Not on file  . Highest education level: Not on file  Occupational History  . Not on file  Tobacco Use  . Smoking status: Current Every Day Smoker    Packs/day: 0.05    Years: 10.00    Pack years: 0.50    Types: Cigarettes  . Smokeless tobacco: Never Used   Substance and Sexual Activity  . Alcohol use: No    Alcohol/week: 2.0 standard drinks    Types: 2 Cans of beer per week  . Drug use: No  . Sexual activity: Yes    Birth control/protection: None  Other Topics Concern  . Not on file  Social History Narrative   Lives in [place].    Hobbies: play solitaire and reading         Social Determinants of Health   Financial Resource Strain:   . Difficulty of Paying Living Expenses: Not on file  Food Insecurity:   . Worried About Programme researcher, broadcasting/film/video in the Last Year: Not on file  . Ran Out of Food in the Last Year: Not on file  Transportation Needs:   . Lack of Transportation (Medical): Not on file  . Lack of Transportation (Non-Medical): Not on file  Physical Activity:   . Days of Exercise per Week: Not on file  . Minutes of Exercise per Session: Not on file  Stress:   . Feeling of Stress : Not on file  Social Connections:   . Frequency of Communication with Friends and Family: Not on file  . Frequency of Social Gatherings with Friends and Family: Not on file  .  Attends Religious Services: Not on file  . Active Member of Clubs or Organizations: Not on file  . Attends Archivist Meetings: Not on file  . Marital Status: Not on file   Past Surgical History:  Procedure Laterality Date  . CRANIOPLASTY    . CRANIOTOMY    . FRACTURE SURGERY    . INDUCED ABORTION     10/26/11  . ORTHOPEDIC SURGERY     Past Medical History:  Diagnosis Date  . Anxiety   . Arthritis    Knees and back   . Headache   . Loss of smell   . Thoracic compression fracture ([place])   . Traumatic brain injury ([place]) 07/28/13   There were no vitals taken for this visit.  Opioid Risk Score:   Fall Risk Score:  `1  Depression screen PHQ 2/9  Depression screen [facility] 2/9 02/14/2019 04/17/2018 08/24/2017 06/09/2016 12/09/2015 04/07/2015  Decreased Interest 1 0 0 0 2 2  Down, Depressed, Hopeless 1 0 0 0 1 1  PHQ - 2 Score 2 0 0 0 3 3  Altered sleeping -  - - - - 1  Tired, decreased energy - - - - - 3  Change in appetite - - - - - 3  Feeling bad or failure about yourself  - - - - - 3  Trouble concentrating - - - - - 3  Moving slowly or fidgety/restless - - - - - 1  Suicidal thoughts - - - - - 0  PHQ-9 Score - - - - - 17    Review of Systems  Constitutional: Negative.   HENT: Negative.   Eyes: Negative.   Respiratory: Negative.   Cardiovascular: Negative.   Endocrine: Negative.   Genitourinary: Negative.   Musculoskeletal: Positive for arthralgias, back pain, neck pain and neck stiffness.  Allergic/Immunologic: Negative.   Neurological: Positive for headaches.  Hematological: Negative.   Psychiatric/Behavioral: The patient is nervous/anxious.   All other systems reviewed and are negative.      Objective:   Physical Exam Physical Exam General: No acute distress HEENT: EOMI, oral membranes moist Cards: reg rate  Chest: normal effort Abdomen: Soft, NT, ND Skin: dry, intact Extremities: no edema Neuro: functional attention. Still distracted. Reasonable balance Musculoskeletal: funcitonal ROM Psych: mood much more up beat       Assessment & Plan:  1. Hx of TBI (07/28/13) with persistent cognitive and behavioral deficits. Also, she's having ongoing headaches, occasional vertigo, anosmia. -making gradual gains. 2. Hx of T6 compression fracture  3. Agoraphobia per psychology  4. Chronic low back pain.  5. 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   Plan:  1. Continue ritalin10mg  1.5 tabletstid#120.  We will continue the controlled substance monitoring program, this consists of regular clinic visits, examinations, routine drug screening, pill counts as well as use of [place] Controlled Substance Reporting System. [place].  -An rx was given for March. -She is to call before April dose is due.  -UDS today  2. Continue topamax 50mg  for headache prophlaxis.No  refills today 3.Continued HEP, encouraged walking 4.Would like her to follow upDr.Rodenbough for ongoinganxiety. she will contact him about a televisit.  Happy that they're getting a new dog.  5.Xanax 0.25mg  qd prn. #30.     Continue low dose klonopin scheduled to assist with anxiety and to assist in sleep---meds were refilled last mo.   Fifteen minutes of face to face patient care time were spent during this  visit. All questions were encouraged and answered.  Follow up with me in 4 mos .

## 2019-11-28 NOTE — Patient Instructions (Signed)
PLEASE FEEL FREE TO CALL OUR OFFICE WITH ANY PROBLEMS OR QUESTIONS ([phone])      

## 2019-12-01 LAB — DRUG TOX MONITOR 1 W/CONF, ORAL FLD
Amphetamines: NEGATIVE ng/mL (ref <10)
Barbiturates: NEGATIVE ng/mL (ref <10)
Benzodiazepines: NEGATIVE ng/mL (ref <0.50)
Buprenorphine: NEGATIVE ng/mL (ref <0.10)
Cocaine: NEGATIVE ng/mL (ref <5.0)
Cotinine: 7.7 ng/mL — ABNORMAL HIGH (ref <5.0)
Fentanyl: NEGATIVE ng/mL (ref <0.10)
Heroin Metabolite: NEGATIVE ng/mL (ref <1.0)
MARIJUANA: NEGATIVE ng/mL (ref <2.5)
MDMA: NEGATIVE ng/mL (ref <10)
Meprobamate: NEGATIVE ng/mL (ref <2.5)
Methadone: NEGATIVE ng/mL (ref <5.0)
Nicotine Metabolite: POSITIVE ng/mL — AB (ref <5.0)
Opiates: NEGATIVE ng/mL (ref <2.5)
Phencyclidine: NEGATIVE ng/mL (ref <10)
Tapentadol: NEGATIVE ng/mL (ref <5.0)
Tramadol: NEGATIVE ng/mL (ref <5.0)
Zolpidem: NEGATIVE ng/mL (ref <5.0)

## 2019-12-01 LAB — DRUG TOX METHYLPHEN W/CONF,ORAL FLD
Methylphenidate: 18.8 ng/mL — ABNORMAL HIGH (ref <1.0)
Methylphenidate: POSITIVE ng/mL — AB (ref <1.0)

## 2019-12-01 LAB — DRUG TOX ALC METAB W/CON, ORAL FLD: Alcohol Metabolite: NEGATIVE ng/mL (ref <25)

## 2019-12-03 ENCOUNTER — Telehealth: Payer: Self-pay

## 2019-12-03 NOTE — Telephone Encounter (Signed)
UDS RESULTS CONSISTENT WITH MEDICATIONS ON FILE  

## 2020-01-11 ENCOUNTER — Other Ambulatory Visit: Payer: Self-pay | Admitting: Physical Medicine & Rehabilitation

## 2020-01-11 DIAGNOSIS — F411 Generalized anxiety disorder: Secondary | ICD-10-CM

## 2020-01-11 DIAGNOSIS — F068 Other specified mental disorders due to known physiological condition: Secondary | ICD-10-CM

## 2020-01-11 DIAGNOSIS — R41840 Attention and concentration deficit: Secondary | ICD-10-CM

## 2020-01-11 DIAGNOSIS — Z79899 Other long term (current) drug therapy: Secondary | ICD-10-CM

## 2020-01-11 DIAGNOSIS — S069X5S Unspecified intracranial injury with loss of consciousness greater than 24 hours with return to pre-existing conscious level, sequela: Secondary | ICD-10-CM

## 2020-01-11 DIAGNOSIS — S069X0D Unspecified intracranial injury without loss of consciousness, subsequent encounter: Secondary | ICD-10-CM

## 2020-01-11 DIAGNOSIS — Z5181 Encounter for therapeutic drug level monitoring: Secondary | ICD-10-CM

## 2020-01-15 ENCOUNTER — Other Ambulatory Visit: Payer: Self-pay | Admitting: Physical Medicine & Rehabilitation

## 2020-01-15 DIAGNOSIS — F068 Other specified mental disorders due to known physiological condition: Secondary | ICD-10-CM

## 2020-01-15 NOTE — Telephone Encounter (Signed)
Patient needs a refill on Ritalin.  Please call Jeri Lager when this is done at [phone].

## 2020-02-11 ENCOUNTER — Other Ambulatory Visit: Payer: Self-pay | Admitting: Physical Medicine & Rehabilitation

## 2020-02-11 DIAGNOSIS — F068 Other specified mental disorders due to known physiological condition: Secondary | ICD-10-CM

## 2020-02-11 DIAGNOSIS — S069X0S Unspecified intracranial injury without loss of consciousness, sequela: Secondary | ICD-10-CM

## 2020-02-20 ENCOUNTER — Other Ambulatory Visit: Payer: Self-pay | Admitting: *Deleted

## 2020-02-20 NOTE — Telephone Encounter (Signed)
Prior authorization submitted via covermymeds for methylphenidate 5mg .  Approved through 10/10/2020

## 2020-03-07 ENCOUNTER — Other Ambulatory Visit: Payer: Self-pay | Admitting: Physical Medicine & Rehabilitation

## 2020-03-07 DIAGNOSIS — S069X0S Unspecified intracranial injury without loss of consciousness, sequela: Secondary | ICD-10-CM

## 2020-03-11 ENCOUNTER — Other Ambulatory Visit (HOSPITAL_COMMUNITY): Payer: Self-pay | Admitting: Physical Medicine & Rehabilitation

## 2020-03-11 NOTE — Telephone Encounter (Signed)
Ritalin rf'ed

## 2020-03-26 ENCOUNTER — Encounter: Payer: Medicare HMO | Attending: Physical Medicine & Rehabilitation | Admitting: Physical Medicine & Rehabilitation

## 2020-03-26 ENCOUNTER — Encounter: Payer: Self-pay | Admitting: Physical Medicine & Rehabilitation

## 2020-03-26 ENCOUNTER — Other Ambulatory Visit: Payer: Self-pay

## 2020-03-26 VITALS — BP 115/83 | HR 81 | Temp 97.5°F | Ht 63.0 in | Wt 175.0 lb

## 2020-03-26 DIAGNOSIS — Z87820 Personal history of traumatic brain injury: Secondary | ICD-10-CM

## 2020-03-26 DIAGNOSIS — F411 Generalized anxiety disorder: Secondary | ICD-10-CM | POA: Diagnosis not present

## 2020-03-26 DIAGNOSIS — S069X0S Unspecified intracranial injury without loss of consciousness, sequela: Secondary | ICD-10-CM

## 2020-03-26 DIAGNOSIS — F068 Other specified mental disorders due to known physiological condition: Secondary | ICD-10-CM | POA: Insufficient documentation

## 2020-03-26 DIAGNOSIS — F419 Anxiety disorder, unspecified: Secondary | ICD-10-CM | POA: Diagnosis not present

## 2020-03-26 MED ORDER — METHYLPHENIDATE HCL 10 MG PO TABS: ORAL_TABLET | ORAL | 0 refills | Status: DC

## 2020-03-26 NOTE — Patient Instructions (Signed)
PLEASE FEEL FREE TO CALL OUR OFFICE WITH ANY PROBLEMS OR QUESTIONS ([phone])      

## 2020-03-26 NOTE — Progress Notes (Signed)
Subjective:    Patient ID: Wanda Warner, female    DOB: 1981/03/18, 39 y.o.   MRN: [id number]  HPI   Tulip is here in f/u of her TBI and associated cognitive behavioral deficits. She got a new dog who's "been a handful" but she has been helpful keeping Noami happy.  The dog is fairly active so she is taking it on walks and providing care for it.  Otherwise she has been feeling fairly good.  Sleep has been intact.  She remains on her Ritalin as well as Klonopin and Xanax as prescribed.  She is using Topamax for headache prophylaxis    Pain Inventory Average Pain 5 Pain Right Now 5 My pain is sharp, burning, dull, stabbing, tingling and aching  In the last 24 hours, has pain interfered with the following? General activity 7 Relation with others 5 Enjoyment of life 5 What TIME of day is your pain at its worst? all Sleep (in general) Fair  Pain is worse with: bending, sitting, inactivity, standing and some activites Pain improves with: medication Relief from Meds: 1  Mobility Do you have any goals in this area?  yes  Function I need assistance with the following:  household duties and shopping Do you have any goals in this area?  yes  Neuro/Psych anxiety  Prior Studies Any changes since last visit?  no  Physicians involved in your care Any changes since last visit?  no   Family History  Problem Relation Age of Onset  . Hypertension Father   . Diabetes Maternal Grandmother    Social History   Socioeconomic History  . Marital status: Single    Spouse name: Not on file  . Number of children: Not on file  . Years of education: Not on file  . Highest education level: Not on file  Occupational History  . Not on file  Tobacco Use  . Smoking status: Current Every Day Smoker    Packs/day: 0.05    Years: 10.00    Pack years: 0.50    Types: Cigarettes  . Smokeless tobacco: Never Used  Substance and Sexual Activity  . Alcohol use: No    Alcohol/week: 2.0 standard  drinks    Types: 2 Cans of beer per week  . Drug use: No  . Sexual activity: Yes    Birth control/protection: None  Other Topics Concern  . Not on file  Social History Narrative   Lives in [place].    Hobbies: play solitaire and reading         Social Determinants of Radio broadcast assistant Strain:   . Difficulty of Paying Living Expenses:   Food Insecurity:   . Worried About Charity fundraiser in the Last Year:   . Arboriculturist in the Last Year:   Transportation Needs:   . Film/video editor (Medical):   Marland Kitchen Lack of Transportation (Non-Medical):   Physical Activity:   . Days of Exercise per Week:   . Minutes of Exercise per Session:   Stress:   . Feeling of Stress :   Social Connections:   . Frequency of Communication with Friends and Family:   . Frequency of Social Gatherings with Friends and Family:   . Attends Religious Services:   . Active Member of Clubs or Organizations:   . Attends Archivist Meetings:   Marland Kitchen Marital Status:    Past Surgical History:  Procedure Laterality Date  . CRANIOPLASTY    .  CRANIOTOMY    . FRACTURE SURGERY    . INDUCED ABORTION     10/26/11  . ORTHOPEDIC SURGERY     Past Medical History:  Diagnosis Date  . Anxiety   . Arthritis    Knees and back   . Headache   . Loss of smell   . Thoracic compression fracture ([facility])   . Traumatic brain injury ([facility]) 07/28/13   BP 115/83   Pulse 81   Temp (!) 97.5 F (36.4 C)   Ht 5\' 3"  (1.6 m)   Wt 175 lb (79.4 kg)   SpO2 98%   BMI 31.00 kg/m   Opioid Risk Score:   Fall Risk Score:  `1  Depression screen PHQ 2/9  Depression screen [facility] 2/9 02/14/2019 04/17/2018 08/24/2017 06/09/2016 12/09/2015 04/07/2015  Decreased Interest 1 0 0 0 2 2  Down, Depressed, Hopeless 1 0 0 0 1 1  PHQ - 2 Score 2 0 0 0 3 3  Altered sleeping - - - - - 1  Tired, decreased energy - - - - - 3  Change in appetite - - - - - 3  Feeling bad or failure about yourself  - - - - - 3  Trouble  concentrating - - - - - 3  Moving slowly or fidgety/restless - - - - - 1  Suicidal thoughts - - - - - 0  PHQ-9 Score - - - - - 17    Review of Systems  Constitutional: Negative.   HENT: Negative.   Eyes: Negative.   Respiratory: Negative.   Cardiovascular: Negative.   Gastrointestinal: Negative.   Endocrine: Negative.   Musculoskeletal: Negative.   Allergic/Immunologic: Negative.   Neurological: Negative.   Hematological: Negative.   Psychiatric/Behavioral: The patient is nervous/anxious.   All other systems reviewed and are negative.      Objective:   Physical Exam General: No acute distress. Weight stable HEENT: EOMI, oral membranes moist Cards: reg rate  Chest: normal effort Abdomen: Soft, NT, ND Skin: dry, intact Extremities: no edema Neuro: improved attention, can lose focus at times Musculoskeletal: funcitonal ROM Psych: mood pleasant and up beat           Assessment & Plan:  1. Hx of TBI (07/28/13) with persistent cognitive and behavioral deficits. Also, she's having ongoing headaches, occasional vertigo, anosmia.              -making gradual gains.   2. Hx of T6 compression fracture  3. Agoraphobia per psychology  4. Chronic low back pain.  5. 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      Plan:  1. Continue ritalin 10mg  1.5 tablets tid  #120.   We will continue the controlled substance monitoring program, this consists of regular clinic visits, examinations, routine drug screening, pill counts as well as use of 07/30/13 Controlled Substance Reporting System. [place].  -An rx was given for  July, August     2. Continue topamax 50mg  for headache prophlaxis. This is working for her.   No refills today 3. Continued HEP, encouraged walking and structured activities with her new dog! 4. she is planning to follow up Dr.Rodenbough for ongoinganxiety. 5. Xanax 0.25mg  qd prn. #30.       Continue  low dose klonopin scheduled to  assist with anxiety and to assist in sleep---no RF needed today      Fifteen minutes of face to face patient care time were spent during this  visit. All questions were encouraged and answered.  Follow up with me in 4 mos .

## 2020-04-08 ENCOUNTER — Other Ambulatory Visit: Payer: Self-pay | Admitting: Physical Medicine & Rehabilitation

## 2020-04-08 DIAGNOSIS — F411 Generalized anxiety disorder: Secondary | ICD-10-CM

## 2020-06-04 ENCOUNTER — Other Ambulatory Visit: Payer: Self-pay | Admitting: Physical Medicine & Rehabilitation

## 2020-06-04 DIAGNOSIS — S069X0D Unspecified intracranial injury without loss of consciousness, subsequent encounter: Secondary | ICD-10-CM

## 2020-06-04 DIAGNOSIS — Z79899 Other long term (current) drug therapy: Secondary | ICD-10-CM

## 2020-06-04 DIAGNOSIS — R41840 Attention and concentration deficit: Secondary | ICD-10-CM

## 2020-06-04 DIAGNOSIS — S069X5S Unspecified intracranial injury with loss of consciousness greater than 24 hours with return to pre-existing conscious level, sequela: Secondary | ICD-10-CM

## 2020-06-04 DIAGNOSIS — S069X0S Unspecified intracranial injury without loss of consciousness, sequela: Secondary | ICD-10-CM

## 2020-06-04 DIAGNOSIS — F411 Generalized anxiety disorder: Secondary | ICD-10-CM

## 2020-06-04 DIAGNOSIS — Z5181 Encounter for therapeutic drug level monitoring: Secondary | ICD-10-CM

## 2020-07-07 ENCOUNTER — Other Ambulatory Visit: Payer: Self-pay | Admitting: Physical Medicine & Rehabilitation

## 2020-07-07 DIAGNOSIS — S069X0S Unspecified intracranial injury without loss of consciousness, sequela: Secondary | ICD-10-CM

## 2020-07-10 ENCOUNTER — Other Ambulatory Visit: Payer: Self-pay | Admitting: Physical Medicine & Rehabilitation

## 2020-07-10 ENCOUNTER — Telehealth: Payer: Self-pay

## 2020-07-10 DIAGNOSIS — F411 Generalized anxiety disorder: Secondary | ICD-10-CM

## 2020-07-10 NOTE — Telephone Encounter (Signed)
Wanda Warner's Ritalin has been sent to pharmacy and ready for pick up.   No voice ID. Generic reply left for Mr. Wanda Warner to call the pharmacy.

## 2020-07-16 ENCOUNTER — Ambulatory Visit: Payer: Medicare HMO | Admitting: Physical Medicine & Rehabilitation

## 2020-07-16 ENCOUNTER — Encounter: Payer: Self-pay | Admitting: Physical Medicine & Rehabilitation

## 2020-07-16 ENCOUNTER — Other Ambulatory Visit: Payer: Self-pay

## 2020-07-16 ENCOUNTER — Encounter: Payer: Medicare HMO | Attending: Physical Medicine & Rehabilitation | Admitting: Physical Medicine & Rehabilitation

## 2020-07-16 VITALS — BP 128/84 | HR 88 | Temp 98.5°F | Ht 63.0 in | Wt 178.2 lb

## 2020-07-16 DIAGNOSIS — M545 Low back pain, unspecified: Secondary | ICD-10-CM | POA: Insufficient documentation

## 2020-07-16 DIAGNOSIS — M25512 Pain in left shoulder: Secondary | ICD-10-CM | POA: Diagnosis not present

## 2020-07-16 DIAGNOSIS — G8929 Other chronic pain: Secondary | ICD-10-CM | POA: Diagnosis not present

## 2020-07-16 DIAGNOSIS — S069X0S Unspecified intracranial injury without loss of consciousness, sequela: Secondary | ICD-10-CM | POA: Diagnosis not present

## 2020-07-16 DIAGNOSIS — M542 Cervicalgia: Secondary | ICD-10-CM | POA: Diagnosis not present

## 2020-07-16 DIAGNOSIS — F068 Other specified mental disorders due to known physiological condition: Secondary | ICD-10-CM | POA: Diagnosis not present

## 2020-07-16 MED ORDER — METHYLPHENIDATE HCL 10 MG PO TABS: ORAL_TABLET | ORAL | 0 refills | Status: DC

## 2020-07-16 NOTE — Patient Instructions (Signed)
PLEASE FEEL FREE TO CALL OUR OFFICE WITH ANY PROBLEMS OR QUESTIONS ([phone])      

## 2020-07-16 NOTE — Progress Notes (Signed)
Subjective:    Patient ID: Wanda Warner, female    DOB: April 26, 1981, 39 y.o.   MRN: [id number]  HPI   Wanda Warner is here in follow up of her TBI and assocaited symptoms. She is having more pain in her low back, worse with bending over, sitting, movement.  Her back will bother her quite a bit if she is working in the kitchen her doing chores around the house.  She often will find it difficult to stand if she has been sitting for a long period of time.  She had a flare a month ago which lasted about a week. She noticed some swelling her feet also.   Wanda Warner also has noticed some pain in her neck which is in the lower portion of her neck into her shoulders.  She is tried some different pillows and has tried to work on her posture but still is experiencing some pain.  Pain is not as significant as the low back pain is however.  She does admit that her anxiety plays a role in increasing her pain levels, particularly in her neck.  She remains on Klonopin for her mood stabilization as well as methylphenidate for her attention and memory. Pain Inventory Average Pain 7 Pain Right Now 5 My pain is sharp, burning, dull, stabbing, tingling and aching  In the last 24 hours, has pain interfered with the following? General activity 6 Relation with others 6 Enjoyment of life 6 What TIME of day is your pain at its worst? morning , daytime, evening and night Sleep (in general) Fair  Pain is worse with: walking, bending, sitting, inactivity, standing and some activites Pain improves with: heat/ice Relief from Meds: 1  Family History  Problem Relation Age of Onset  . Hypertension Father   . Diabetes Maternal Grandmother    Social History   Socioeconomic History  . Marital status: Single    Spouse name: Not on file  . Number of children: Not on file  . Years of education: Not on file  . Highest education level: Not on file  Occupational History  . Not on file  Tobacco Use  . Smoking status: Current  Every Day Smoker    Packs/day: 0.05    Years: 10.00    Pack years: 0.50    Types: Cigarettes  . Smokeless tobacco: Never Used  Substance and Sexual Activity  . Alcohol use: No    Alcohol/week: 2.0 standard drinks    Types: 2 Cans of beer per week  . Drug use: No  . Sexual activity: Yes    Birth control/protection: None  Other Topics Concern  . Not on file  Social History Narrative   Lives in [place].    Hobbies: play solitaire and reading         Social Determinants of Health   Financial Resource Strain:   . Difficulty of Paying Living Expenses: Not on file  Food Insecurity:   . Worried About Programme researcher, broadcasting/film/video in the Last Year: Not on file  . Ran Out of Food in the Last Year: Not on file  Transportation Needs:   . Lack of Transportation (Medical): Not on file  . Lack of Transportation (Non-Medical): Not on file  Physical Activity:   . Days of Exercise per Week: Not on file  . Minutes of Exercise per Session: Not on file  Stress:   . Feeling of Stress : Not on file  Social Connections:   . Frequency of Communication with  Friends and Family: Not on file  . Frequency of Social Gatherings with Friends and Family: Not on file  . Attends Religious Services: Not on file  . Active Member of Clubs or Organizations: Not on file  . Attends Banker Meetings: Not on file  . Marital Status: Not on file   Past Surgical History:  Procedure Laterality Date  . CRANIOPLASTY    . CRANIOTOMY    . FRACTURE SURGERY    . INDUCED ABORTION     10/26/11  . ORTHOPEDIC SURGERY     Past Surgical History:  Procedure Laterality Date  . CRANIOPLASTY    . CRANIOTOMY    . FRACTURE SURGERY    . INDUCED ABORTION     10/26/11  . ORTHOPEDIC SURGERY     Past Medical History:  Diagnosis Date  . Anxiety   . Arthritis    Knees and back   . Headache   . Loss of smell   . Thoracic compression fracture ([facility])   . Traumatic brain injury ([facility]) 07/28/13   BP 128/84   Pulse 88    Temp 98.5 F (36.9 C)   Ht 5\' 3"  (1.6 m)   Wt 178 lb 3.2 oz (80.8 kg)   SpO2 98%   BMI 31.57 kg/m   Opioid Risk Score:   Fall Risk Score:  `1  Depression screen PHQ 2/9  Depression screen [facility] 2/9 07/16/2020 02/14/2019 04/17/2018 08/24/2017 06/09/2016 12/09/2015 04/07/2015  Decreased Interest 1 1 0 0 0 2 2  Down, Depressed, Hopeless 1 1 0 0 0 1 1  PHQ - 2 Score 2 2 0 0 0 3 3  Altered sleeping - - - - - - 1  Tired, decreased energy - - - - - - 3  Change in appetite - - - - - - 3  Feeling bad or failure about yourself  - - - - - - 3  Trouble concentrating - - - - - - 3  Moving slowly or fidgety/restless - - - - - - 1  Suicidal thoughts - - - - - - 0  PHQ-9 Score - - - - - - 17   Review of Systems  Constitutional: Negative.   HENT: Negative.   Eyes: Negative.   Respiratory: Negative.   Cardiovascular: Negative.   Gastrointestinal: Negative.   Endocrine: Negative.   Genitourinary: Negative.   Musculoskeletal: Negative.   Skin: Negative.   Allergic/Immunologic: Negative.   Neurological: Negative.   Hematological: Negative.   Psychiatric/Behavioral: Positive for dysphoric mood.        Recent death in the family  All other systems reviewed and are negative.      Objective:   Physical Exam  General: No acute distress HEENT: EOMI, oral membranes moist Cards: reg rate  Chest: normal effort Abdomen: Soft, NT, ND Skin: dry, intact Extremities: no edema Neuro:  functional attention Musculoskeletal:  Low back notable for pain with palpation over the lower lumbar segments near the belt line.  Slight flattening of lordotic curve.  Extension and facet maneuvers cause pain right more than left.  She has some discomfort as well with forward flexion.  Seated slump test was negative.  She has a slight head forward posture and some pain in the lower cervical segments as well.  There is associated tightness in her rhomboids as well as the lower trap.  Lumbar paraspinals are tight as well  right more than left. Psych: mood pleasant and up beat  Assessment & Plan:  1. Hx of TBI (07/28/13) with persistent cognitive and behavioral deficits. Also, she's having ongoing headaches, occasional vertigo, anosmia.              -making gradual gains.   2. Hx of T6 compression fracture  3. Agoraphobia per psychology  4. Chronic low back pain.  5. 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      Plan:  1. Continue ritalin 10mg  1.5 tablets tid  #120.   We will continue the controlled substance monitoring program, this consists of regular clinic visits, examinations, routine drug screening, pill counts as well as use of Controlled Substance Reporting System. [place] today.      2. Continue topamax 50mg  for headache prophlaxis. This is working for her.   No refills today 3. Continued HEP, encouraged walking and structured activities with her new dog! 4. she is planning to follow up Dr.Rodenbough for ongoinganxiety. 5. Xanax 0.25mg  qd prn. #30.       Continue  low dose klonopin scheduled to assist with anxiety and to assist in sleep---no RF needed today    6.  Low back pain.  Symptoms most consistent with facet arthropathy.  X-rays from 2016 did not demonstrate any obvious disease.  They gets appropriate to pursue an MRI at this time given chronic nature of pain and recent increase.  Discussed importance of posture and stretching.  Depending on MRI results will refer for physical therapy  7.  Cervicalgia.  May have an element of spondylosis given her traumatic injury.  She wore a cervical collar for a period of time after the head trauma.  We will check x-rays of the cervical spine today.  Discussed stress management and range of motion/posture as well   15 minutes of time spent with the patient in discussion and review of her treatment plan with physical examination.  I will see her back in the office in about 6 weeks time.      Assessment & Plan:

## 2020-08-06 ENCOUNTER — Other Ambulatory Visit: Payer: Self-pay

## 2020-08-06 ENCOUNTER — Ambulatory Visit
Admission: RE | Admit: 2020-08-06 | Discharge: 2020-08-06 | Disposition: A | Discharge status: Home / Self Care | Payer: Medicare HMO | Source: Ambulatory Visit | Attending: Physical Medicine & Rehabilitation | Admitting: Physical Medicine & Rehabilitation

## 2020-08-06 DIAGNOSIS — M545 Low back pain, unspecified: Secondary | ICD-10-CM

## 2020-08-20 DIAGNOSIS — S069X0S Unspecified intracranial injury without loss of consciousness, sequela: Secondary | ICD-10-CM

## 2020-08-20 MED ORDER — METHYLPHENIDATE HCL 10 MG PO TABS: ORAL_TABLET | ORAL | 0 refills | Status: DC

## 2020-08-20 NOTE — Telephone Encounter (Signed)
Ritalin refilled

## 2020-08-27 ENCOUNTER — Ambulatory Visit: Payer: Medicare HMO | Admitting: Physical Medicine & Rehabilitation

## 2020-09-03 ENCOUNTER — Ambulatory Visit: Payer: Medicare HMO | Admitting: Physical Medicine & Rehabilitation

## 2020-09-10 ENCOUNTER — Ambulatory Visit: Payer: Medicare HMO | Admitting: Physical Medicine & Rehabilitation

## 2020-09-24 ENCOUNTER — Other Ambulatory Visit: Payer: Self-pay

## 2020-09-24 ENCOUNTER — Encounter: Payer: Medicare HMO | Attending: Physical Medicine & Rehabilitation | Admitting: Physical Medicine & Rehabilitation

## 2020-09-24 ENCOUNTER — Encounter: Payer: Self-pay | Admitting: Physical Medicine & Rehabilitation

## 2020-09-24 VITALS — BP 127/88 | HR 97 | Temp 99.3°F | Ht 63.0 in | Wt 175.0 lb

## 2020-09-24 DIAGNOSIS — Z5181 Encounter for therapeutic drug level monitoring: Secondary | ICD-10-CM | POA: Diagnosis not present

## 2020-09-24 DIAGNOSIS — R41840 Attention and concentration deficit: Secondary | ICD-10-CM | POA: Diagnosis not present

## 2020-09-24 DIAGNOSIS — S069X5S Unspecified intracranial injury with loss of consciousness greater than 24 hours with return to pre-existing conscious level, sequela: Secondary | ICD-10-CM

## 2020-09-24 DIAGNOSIS — M47816 Spondylosis without myelopathy or radiculopathy, lumbar region: Secondary | ICD-10-CM | POA: Insufficient documentation

## 2020-09-24 DIAGNOSIS — F411 Generalized anxiety disorder: Secondary | ICD-10-CM

## 2020-09-24 DIAGNOSIS — G8929 Other chronic pain: Secondary | ICD-10-CM | POA: Insufficient documentation

## 2020-09-24 DIAGNOSIS — M25561 Pain in right knee: Secondary | ICD-10-CM | POA: Diagnosis not present

## 2020-09-24 DIAGNOSIS — S069X0D Unspecified intracranial injury without loss of consciousness, subsequent encounter: Secondary | ICD-10-CM | POA: Insufficient documentation

## 2020-09-24 DIAGNOSIS — F068 Other specified mental disorders due to known physiological condition: Secondary | ICD-10-CM

## 2020-09-24 DIAGNOSIS — Z79899 Other long term (current) drug therapy: Secondary | ICD-10-CM | POA: Diagnosis not present

## 2020-09-24 DIAGNOSIS — S069X0S Unspecified intracranial injury without loss of consciousness, sequela: Secondary | ICD-10-CM | POA: Diagnosis not present

## 2020-09-24 MED ORDER — DICLOFENAC SODIUM 50 MG PO TBEC: 50.0000 mg | DELAYED_RELEASE_TABLET | Freq: Two times a day (BID) | ORAL | 3 refills | Status: DC

## 2020-09-24 MED ORDER — METHYLPHENIDATE HCL 10 MG PO TABS: ORAL_TABLET | ORAL | 0 refills | Status: DC

## 2020-09-24 NOTE — Addendum Note (Signed)
Addended by: Faith Rogue T on: 09/24/2020 03:51 PM   Modules accepted: Level of Service

## 2020-09-24 NOTE — Patient Instructions (Signed)
PLEASE FEEL FREE TO CALL OUR OFFICE WITH ANY PROBLEMS OR QUESTIONS ([phone])                                @                 @@               @@@                        @@@@                      @@@@@         @@@@@@                  @@@@@@@                @@@@@@@@              @@@@@@@@@             @@@@@@@@@@       IIII                  IIII                                                        HAPPY HOLIDAYS!!!!!  

## 2020-09-24 NOTE — Progress Notes (Signed)
Subjective:    Patient ID: Wanda Warner, female    DOB: 1981/02/04, 39 y.o.   MRN: [id number]  HPI   Wanda Warner is here in follow up of her TBI and chronic pain.   She was out shopping on [facility] Friday and walking a lot for 2 days. She developed significant low back pain and spasms as well as pain and swelling in her right knee/leg.  She has used some over-the-counter and prescription ibuprofen with little benefit.  She has noted that when she bends forward the pain is somewhat improved.  We had an MRI ordered after her complaints of increased back pain at her last visit.  MRI was performed on August 06, 2020 and revealed the following   L4-5: Mild desiccation and bulging of the disc. No canal or foraminal stenosis.  L5-S1: Mild desiccation of the disc with minimal bulging. No canal or foraminal stenosis. As noted above, there is minimal endplate edema which could be associated with low back pain. Mild facet osteoarthritis also present this level.  IMPRESSION: No advanced finding. Mild desiccation and bulging of the discs at L4-5 and L5-S1. No disc herniation. No canal or foraminal stenosis. Minimal discogenic endplate edema at W4-O9 on the right could be associated with low back pain. Mild facet osteoarthritis at L5-S1.  She feels that her back tends to bother her as she stands or walks for more than a few minutes or even if she sits too long.  She is not using any ice or heat.  She has used muscle relaxant.   Pain Inventory Average Pain 5 Pain Right Now 5 My pain is sharp, burning, dull, stabbing, tingling and aching  In the last 24 hours, has pain interfered with the following? General activity 8 Relation with others 7 Enjoyment of life 9 What TIME of day is your pain at its worst? daytime and evening Sleep (in general) Poor  Pain is worse with: walking, bending, sitting, inactivity, standing and some activites Pain improves with: na Relief from Meds: 0  Family History   Problem Relation Age of Onset  . Hypertension Father   . Diabetes Maternal Grandmother    Social History   Socioeconomic History  . Marital status: Single    Spouse name: Not on file  . Number of children: Not on file  . Years of education: Not on file  . Highest education level: Not on file  Occupational History  . Not on file  Tobacco Use  . Smoking status: Current Every Day Smoker    Packs/day: 0.05    Years: 10.00    Pack years: 0.50    Types: Cigarettes  . Smokeless tobacco: Never Used  Substance and Sexual Activity  . Alcohol use: No    Alcohol/week: 2.0 standard drinks    Types: 2 Cans of beer per week  . Drug use: No  . Sexual activity: Yes    Birth control/protection: None  Other Topics Concern  . Not on file  Social History Narrative   Lives in [place].    Hobbies: play solitaire and reading         Social Determinants of Corporate investment banker Strain: Not on file  Food Insecurity: Not on file  Transportation Needs: Not on file  Physical Activity: Not on file  Stress: Not on file  Social Connections: Not on file   Past Surgical History:  Procedure Laterality Date  . CRANIOPLASTY    . CRANIOTOMY    .  FRACTURE SURGERY    . INDUCED ABORTION     10/26/11  . ORTHOPEDIC SURGERY     Past Surgical History:  Procedure Laterality Date  . CRANIOPLASTY    . CRANIOTOMY    . FRACTURE SURGERY    . INDUCED ABORTION     10/26/11  . ORTHOPEDIC SURGERY     Past Medical History:  Diagnosis Date  . Anxiety   . Arthritis    Knees and back   . Headache   . Loss of smell   . Thoracic compression fracture (HCC)   . Traumatic brain injury (HCC) 07/28/13   BP 127/88   Pulse 97   Temp 99.3 F (37.4 C)   Ht 5\' 3"  (1.6 m)   Wt 175 lb (79.4 kg)   SpO2 97%   BMI 31.00 kg/m   Opioid Risk Score:   Fall Risk Score:  `1  Depression screen PHQ 2/9  Depression screen [facility] 2/9 07/16/2020 02/14/2019 04/17/2018 08/24/2017 06/09/2016 12/09/2015 04/07/2015   Decreased Interest 1 1 0 0 0 2 2  Down, Depressed, Hopeless 1 1 0 0 0 1 1  PHQ - 2 Score 2 2 0 0 0 3 3  Altered sleeping - - - - - - 1  Tired, decreased energy - - - - - - 3  Change in appetite - - - - - - 3  Feeling bad or failure about yourself  - - - - - - 3  Trouble concentrating - - - - - - 3  Moving slowly or fidgety/restless - - - - - - 1  Suicidal thoughts - - - - - - 0  PHQ-9 Score - - - - - - 17    Review of Systems  Musculoskeletal: Positive for back pain.  All other systems reviewed and are negative.      Objective:   Physical Exam   General: No acute distress HEENT: EOMI, oral membranes moist Cards: reg rate  Chest: normal effort Abdomen: Soft, NT, ND Skin: dry, intact Extremities: no edema  Neuro: functional attention but still can lose function Musculoskeletal:  Patient with continued low back pain with palpation clinically at the L5-S1 level with slight flattening of lordotic curve.  Extension and facet maneuvers cause pain bilaterally.  She may be a bit elevated in the right hemipelvis also.  Thoracic and cervical spine are fairly normal in appearance today.  Hamstrings are tight as well as glutes bilaterally.  Psych: pt anxious         Assessment & Plan:  1. Hx of TBI (07/28/13) with persistent cognitive and behavioral deficits. Also, she's having ongoing headaches, occasional vertigo, anosmia.              -making gradual gains.   2. Hx of T6 compression fracture  3. Agoraphobia per psychology  4.  Chronic but increasing low back pain most consistent with facet arthropathy although MRI does demonstrate some degenerative disc disease at L4-L5 and L5-S1 5. 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      Plan:  1. Continue ritalin 10mg  1.5 tablets tid  #120.   -We will continue the controlled substance monitoring program, this consists of regular clinic visits, examinations, routine drug screening, pill counts as well as use of 07/30/13  Controlled Substance Reporting System. [place].   -Medication was refilled and a second prescription was sent to the patient's pharmacy for next month.    2. Continue topamax 50mg   for headache prophlaxis. This is working for her.   No refills today 3. reviewed HEP 4. she is planning to follow up Dr.Rodenbough for ongoinganxiety. 5. Xanax 0.25mg  qd prn. #30.     Continue  low dose klonopin scheduled to assist with anxiety and to assist in sleep---no RF needed today              6.  I reviewed her MRI and clinical picture at length. Made referral to [facility] outpatient orthopedic rehab to address her low back pain.  I would like the focus to be on lumbar spine strengthening and lengthening exercises with the focus on her facet disease primarily.  She needs to stretch her gluteal muscles as well as hamstrings and have a solid home exercise program.   -Trial of diclofenac 50 mg p.o. twice daily with food             7.  Cervicalgia.  Seems slightly improved as low back seems to be the center of her pain   8.  Right knee pain.  Sent patient for x-rays 4 views   -Diclofenac as above  25 minutes of face to face patient care time were spent during this visit. All questions were encouraged and answered.  Follow up with me in 2 mos .

## 2020-10-09 ENCOUNTER — Other Ambulatory Visit: Payer: Self-pay | Admitting: Physical Medicine & Rehabilitation

## 2020-10-09 DIAGNOSIS — F411 Generalized anxiety disorder: Secondary | ICD-10-CM

## 2020-11-03 ENCOUNTER — Other Ambulatory Visit: Payer: Self-pay | Admitting: Physical Medicine & Rehabilitation

## 2020-11-03 DIAGNOSIS — R41840 Attention and concentration deficit: Secondary | ICD-10-CM

## 2020-11-03 DIAGNOSIS — F411 Generalized anxiety disorder: Secondary | ICD-10-CM

## 2020-11-03 DIAGNOSIS — R4189 Other symptoms and signs involving cognitive functions and awareness: Secondary | ICD-10-CM

## 2020-11-03 DIAGNOSIS — S069X0D Unspecified intracranial injury without loss of consciousness, subsequent encounter: Secondary | ICD-10-CM

## 2020-11-03 DIAGNOSIS — S069X0S Unspecified intracranial injury without loss of consciousness, sequela: Secondary | ICD-10-CM

## 2020-11-03 DIAGNOSIS — Z79899 Other long term (current) drug therapy: Secondary | ICD-10-CM

## 2020-11-03 DIAGNOSIS — Z5181 Encounter for therapeutic drug level monitoring: Secondary | ICD-10-CM

## 2020-11-03 DIAGNOSIS — S069X5S Unspecified intracranial injury with loss of consciousness greater than 24 hours with return to pre-existing conscious level, sequela: Secondary | ICD-10-CM

## 2020-11-19 ENCOUNTER — Telehealth (HOSPITAL_COMMUNITY): Payer: Self-pay | Admitting: Physical Medicine & Rehabilitation

## 2020-11-19 DIAGNOSIS — F068 Other specified mental disorders due to known physiological condition: Secondary | ICD-10-CM

## 2020-11-19 DIAGNOSIS — S069X0S Unspecified intracranial injury without loss of consciousness, sequela: Secondary | ICD-10-CM

## 2020-11-19 DIAGNOSIS — F411 Generalized anxiety disorder: Secondary | ICD-10-CM

## 2020-11-19 MED ORDER — METHYLPHENIDATE HCL 10 MG PO TABS: ORAL_TABLET | ORAL | 0 refills | Status: DC

## 2020-11-19 MED ORDER — CLONAZEPAM 0.5 MG PO TABS: 0.5000 mg | ORAL_TABLET | Freq: Two times a day (BID) | ORAL | 2 refills | Status: DC

## 2020-11-19 NOTE — Telephone Encounter (Signed)
Klonopin and ritatlin refilled

## 2020-12-28 DIAGNOSIS — S069X0S Unspecified intracranial injury without loss of consciousness, sequela: Secondary | ICD-10-CM

## 2020-12-28 DIAGNOSIS — F068 Other specified mental disorders due to known physiological condition: Secondary | ICD-10-CM

## 2020-12-30 MED ORDER — METHYLPHENIDATE HCL 10 MG PO TABS: ORAL_TABLET | ORAL | 0 refills | Status: DC

## 2020-12-30 NOTE — Telephone Encounter (Signed)
Ritalin sent to [facility] per pt request

## 2021-01-21 ENCOUNTER — Encounter: Payer: Medicare HMO | Admitting: Physical Medicine & Rehabilitation

## 2021-01-30 ENCOUNTER — Other Ambulatory Visit: Payer: Self-pay | Admitting: Physical Medicine & Rehabilitation

## 2021-01-30 DIAGNOSIS — S069X0S Unspecified intracranial injury without loss of consciousness, sequela: Secondary | ICD-10-CM

## 2021-01-30 DIAGNOSIS — F068 Other specified mental disorders due to known physiological condition: Secondary | ICD-10-CM

## 2021-01-30 MED ORDER — METHYLPHENIDATE HCL 10 MG PO TABS
ORAL_TABLET | ORAL | 0 refills | Status: DC
Start: 2021-01-30 — End: 2021-03-03

## 2021-01-30 NOTE — Telephone Encounter (Signed)
rx sent to pharmacy

## 2021-02-04 ENCOUNTER — Encounter: Payer: Self-pay | Admitting: Physical Medicine & Rehabilitation

## 2021-02-04 ENCOUNTER — Encounter: Payer: Medicare HMO | Attending: Physical Medicine & Rehabilitation | Admitting: Physical Medicine & Rehabilitation

## 2021-02-04 ENCOUNTER — Other Ambulatory Visit: Payer: Self-pay

## 2021-02-04 VITALS — BP 124/80 | HR 76 | Ht 63.0 in | Wt 174.0 lb

## 2021-02-04 DIAGNOSIS — S069X0S Unspecified intracranial injury without loss of consciousness, sequela: Secondary | ICD-10-CM | POA: Diagnosis not present

## 2021-02-04 DIAGNOSIS — F068 Other specified mental disorders due to known physiological condition: Secondary | ICD-10-CM | POA: Diagnosis not present

## 2021-02-04 DIAGNOSIS — R41841 Cognitive communication deficit: Secondary | ICD-10-CM

## 2021-02-04 DIAGNOSIS — M47816 Spondylosis without myelopathy or radiculopathy, lumbar region: Secondary | ICD-10-CM | POA: Insufficient documentation

## 2021-02-04 DIAGNOSIS — F411 Generalized anxiety disorder: Secondary | ICD-10-CM | POA: Diagnosis not present

## 2021-02-04 MED ORDER — CLONAZEPAM 0.5 MG PO TABS: 0.5000 mg | ORAL_TABLET | Freq: Two times a day (BID) | ORAL | 2 refills | Status: DC

## 2021-02-04 NOTE — Patient Instructions (Signed)
PLEASE FEEL FREE TO CALL OUR OFFICE WITH ANY PROBLEMS OR QUESTIONS ([phone])      

## 2021-02-04 NOTE — Progress Notes (Signed)
Subjective:    Patient ID: Wanda Warner, female    DOB: 10-02-1981, 40 y.o.   MRN: [id number]  HPI   Wanda Warner is here in folllow up of her TBI. She said that the diclofenac didn't help and upset her stomach. "no better than ibuprofen". She did not go to PT as it wouldn't help either. She and husband proceeded to say that they've been telling me about this for years and I haven't done anything for it. I asked her why she didn't call me when the diclofenac didn't help. She implied that I wouldn't do anything else anyway.    Pain Inventory Average Pain 5 Pain Right Now 5 My pain is sharp, burning, stabbing, tingling and aching  In the last 24 hours, has pain interfered with the following? General activity 5 Relation with others 4 Enjoyment of life 4 What TIME of day is your pain at its worst? daytime, evening and night Sleep (in general) Fair  Pain is worse with: walking, bending, sitting, inactivity, standing and some activites Pain improves with: medication Relief from Meds: 2  Family History  Problem Relation Age of Onset  . Hypertension Father   . Diabetes Maternal Grandmother    Social History   Socioeconomic History  . Marital status: Single    Spouse name: Not on file  . Number of children: Not on file  . Years of education: Not on file  . Highest education level: Not on file  Occupational History  . Not on file  Tobacco Use  . Smoking status: Current Every Day Smoker    Packs/day: 0.05    Years: 10.00    Pack years: 0.50    Types: Cigarettes  . Smokeless tobacco: Never Used  Substance and Sexual Activity  . Alcohol use: No    Alcohol/week: 2.0 standard drinks    Types: 2 Cans of beer per week  . Drug use: No  . Sexual activity: Yes    Birth control/protection: None  Other Topics Concern  . Not on file  Social History Narrative   Lives in [place].    Hobbies: play solitaire and reading         Social Determinants of Corporate investment banker  Strain: Not on file  Food Insecurity: Not on file  Transportation Needs: Not on file  Physical Activity: Not on file  Stress: Not on file  Social Connections: Not on file   Past Surgical History:  Procedure Laterality Date  . CRANIOPLASTY    . CRANIOTOMY    . FRACTURE SURGERY    . INDUCED ABORTION     10/26/11  . ORTHOPEDIC SURGERY     Past Surgical History:  Procedure Laterality Date  . CRANIOPLASTY    . CRANIOTOMY    . FRACTURE SURGERY    . INDUCED ABORTION     10/26/11  . ORTHOPEDIC SURGERY     Past Medical History:  Diagnosis Date  . Anxiety   . Arthritis    Knees and back   . Headache   . Loss of smell   . Thoracic compression fracture (HCC)   . Traumatic brain injury (HCC) 07/28/13   BP 124/80   Pulse 76   Ht 5\' 3"  (1.6 m)   Wt 174 lb (78.9 kg)   SpO2 98%   BMI 30.82 kg/m   Opioid Risk Score:   Fall Risk Score:  `1  Depression screen PHQ 2/9  Depression screen [facility] 2/9 07/16/2020 02/14/2019 04/17/2018 08/24/2017  06/09/2016 12/09/2015 04/07/2015  Decreased Interest 1 1 0 0 0 2 2  Down, Depressed, Hopeless 1 1 0 0 0 1 1  PHQ - 2 Score 2 2 0 0 0 3 3  Altered sleeping - - - - - - 1  Tired, decreased energy - - - - - - 3  Change in appetite - - - - - - 3  Feeling bad or failure about yourself  - - - - - - 3  Trouble concentrating - - - - - - 3  Moving slowly or fidgety/restless - - - - - - 1  Suicidal thoughts - - - - - - 0  PHQ-9 Score - - - - - - 17    Review of Systems  Constitutional: Negative.   HENT: Negative.   Eyes: Negative.   Respiratory: Negative.   Gastrointestinal: Negative.   Endocrine: Negative.   Genitourinary: Negative.   Musculoskeletal: Positive for arthralgias and back pain.  Skin: Negative.   Allergic/Immunologic: Negative.   Neurological: Negative.   Hematological: Negative.   Psychiatric/Behavioral: Positive for confusion and decreased concentration.  All other systems reviewed and are negative.      Objective:   Physical  Exam General: No acute distress HEENT: EOMI, oral membranes moist Cards: reg rate  Chest: normal effort Abdomen: Soft, NT, ND Skin: dry, intact Extremities: no edema Psych: anxious  Neuro: functional but impaired attention         Assessment & Plan:  1. Hx of TBI (07/28/13) with persistent cognitive and behavioral deficits. Also, she's having ongoing headaches, occasional vertigo, anosmia.              -making gradual gains.   2. Hx of T6 compression fracture  3. Agoraphobia per psychology  4.  Chronic but increasing low back pain most consistent with facet arthropathy although MRI does demonstrate some degenerative disc disease at L4-L5 and L5-S1 5. 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      Plan:  1. Continue ritalin 10mg  1.5 tablets tid  #120.   -We will continue the controlled substance monitoring program, this consists of regular clinic visits, examinations, routine drug screening, pill counts as well as use of Controlled Substance Reporting System. [place] today.   -Medication was refilled and a second prescription was sent to the patient's pharmacy for next month.    2. Continue topamax 50mg  for headache prophlaxis. This is working for her.   No refills today 3. reviewed HEP 4. she is planning to follow up Dr.Rodenbough for ongoinganxiety. 5. Xanax 0.25mg  qd prn. #30.     Continue  low dose klonopin scheduled to assist with anxiety and to assist in sleep---no RF needed today              6.  Pt does not want therapy or more conservative care for her back. She/husband seemed to indicate they wanted "pain management". I told her that going through therapy for a problem like hers (mild DDD/facet arthropathy) would likely prove quite helpful given the lack of impressive findings on MRI.    They decided to pursue care elsewhere as I was not "doing anything"        7.  Cervicalgia.  Seems slightly improved as low back seems to be the center of her  pain  10 minutes of face to face patient care time were spent during this visit. All questions were encouraged and answered.  Follow up with NP in 4 mos .

## 2021-02-28 ENCOUNTER — Other Ambulatory Visit: Payer: Self-pay | Admitting: Physical Medicine & Rehabilitation

## 2021-02-28 DIAGNOSIS — F411 Generalized anxiety disorder: Secondary | ICD-10-CM

## 2021-03-03 ENCOUNTER — Telehealth: Payer: Self-pay

## 2021-03-03 DIAGNOSIS — F068 Other specified mental disorders due to known physiological condition: Secondary | ICD-10-CM

## 2021-03-03 DIAGNOSIS — S069X0S Unspecified intracranial injury without loss of consciousness, sequela: Secondary | ICD-10-CM

## 2021-03-03 MED ORDER — METHYLPHENIDATE HCL 10 MG PO TABS: ORAL_TABLET | ORAL | 0 refills | Status: AC

## 2021-03-03 NOTE — Telephone Encounter (Signed)
Yet, she is scheduled for a f/u visit on 8/24 with Riley Lam???

## 2021-03-03 NOTE — Telephone Encounter (Signed)
Wanda Warner called for patient stating that Dr. Riley Kill is suppose to refill Methylphenidate until patient can get in with another doctor. [facility]

## 2021-03-05 DIAGNOSIS — Z79899 Other long term (current) drug therapy: Secondary | ICD-10-CM | POA: Diagnosis not present

## 2021-03-05 DIAGNOSIS — M546 Pain in thoracic spine: Secondary | ICD-10-CM | POA: Diagnosis not present

## 2021-03-05 DIAGNOSIS — F419 Anxiety disorder, unspecified: Secondary | ICD-10-CM | POA: Diagnosis not present

## 2021-03-05 DIAGNOSIS — M545 Low back pain, unspecified: Secondary | ICD-10-CM | POA: Diagnosis not present

## 2021-03-05 DIAGNOSIS — F988 Other specified behavioral and emotional disorders with onset usually occurring in childhood and adolescence: Secondary | ICD-10-CM | POA: Diagnosis not present

## 2021-04-01 DIAGNOSIS — Z79899 Other long term (current) drug therapy: Secondary | ICD-10-CM | POA: Diagnosis not present

## 2021-04-01 DIAGNOSIS — F419 Anxiety disorder, unspecified: Secondary | ICD-10-CM | POA: Diagnosis not present

## 2021-04-01 DIAGNOSIS — M546 Pain in thoracic spine: Secondary | ICD-10-CM | POA: Diagnosis not present

## 2021-04-01 DIAGNOSIS — M545 Low back pain, unspecified: Secondary | ICD-10-CM | POA: Diagnosis not present

## 2021-04-01 DIAGNOSIS — F988 Other specified behavioral and emotional disorders with onset usually occurring in childhood and adolescence: Secondary | ICD-10-CM | POA: Diagnosis not present

## 2021-05-01 DIAGNOSIS — M545 Low back pain, unspecified: Secondary | ICD-10-CM | POA: Diagnosis not present

## 2021-05-01 DIAGNOSIS — S062X9S Diffuse traumatic brain injury with loss of consciousness of unspecified duration, sequela: Secondary | ICD-10-CM | POA: Diagnosis not present

## 2021-05-01 DIAGNOSIS — R519 Headache, unspecified: Secondary | ICD-10-CM | POA: Diagnosis not present

## 2021-05-01 DIAGNOSIS — M546 Pain in thoracic spine: Secondary | ICD-10-CM | POA: Diagnosis not present

## 2021-05-01 DIAGNOSIS — Z79899 Other long term (current) drug therapy: Secondary | ICD-10-CM | POA: Diagnosis not present

## 2021-05-06 DIAGNOSIS — Z008 Encounter for other general examination: Secondary | ICD-10-CM | POA: Diagnosis not present

## 2021-05-06 DIAGNOSIS — F320 Major depressive disorder, single episode, mild: Secondary | ICD-10-CM | POA: Diagnosis not present

## 2021-05-06 DIAGNOSIS — R5383 Other fatigue: Secondary | ICD-10-CM | POA: Diagnosis not present

## 2021-05-06 DIAGNOSIS — E559 Vitamin D deficiency, unspecified: Secondary | ICD-10-CM | POA: Diagnosis not present

## 2021-06-01 DIAGNOSIS — Z79899 Other long term (current) drug therapy: Secondary | ICD-10-CM | POA: Diagnosis not present

## 2021-06-01 DIAGNOSIS — M545 Low back pain, unspecified: Secondary | ICD-10-CM | POA: Diagnosis not present

## 2021-06-01 DIAGNOSIS — M546 Pain in thoracic spine: Secondary | ICD-10-CM | POA: Diagnosis not present

## 2021-06-01 DIAGNOSIS — M542 Cervicalgia: Secondary | ICD-10-CM | POA: Diagnosis not present

## 2021-06-01 DIAGNOSIS — G8929 Other chronic pain: Secondary | ICD-10-CM | POA: Diagnosis not present

## 2021-06-01 DIAGNOSIS — F17210 Nicotine dependence, cigarettes, uncomplicated: Secondary | ICD-10-CM | POA: Diagnosis not present

## 2021-06-03 ENCOUNTER — Encounter: Payer: Medicare HMO | Admitting: Registered Nurse

## 2021-06-10 DIAGNOSIS — F902 Attention-deficit hyperactivity disorder, combined type: Secondary | ICD-10-CM | POA: Diagnosis not present

## 2021-06-10 DIAGNOSIS — F320 Major depressive disorder, single episode, mild: Secondary | ICD-10-CM | POA: Diagnosis not present

## 2021-06-10 DIAGNOSIS — F411 Generalized anxiety disorder: Secondary | ICD-10-CM | POA: Diagnosis not present

## 2021-06-10 DIAGNOSIS — Z87820 Personal history of traumatic brain injury: Secondary | ICD-10-CM | POA: Diagnosis not present

## 2021-07-02 DIAGNOSIS — M545 Low back pain, unspecified: Secondary | ICD-10-CM | POA: Diagnosis not present

## 2021-07-02 DIAGNOSIS — F17210 Nicotine dependence, cigarettes, uncomplicated: Secondary | ICD-10-CM | POA: Diagnosis not present

## 2021-07-02 DIAGNOSIS — F419 Anxiety disorder, unspecified: Secondary | ICD-10-CM | POA: Diagnosis not present

## 2021-07-02 DIAGNOSIS — G8929 Other chronic pain: Secondary | ICD-10-CM | POA: Diagnosis not present

## 2021-07-02 DIAGNOSIS — Z79899 Other long term (current) drug therapy: Secondary | ICD-10-CM | POA: Diagnosis not present

## 2021-07-02 DIAGNOSIS — Z0000 Encounter for general adult medical examination without abnormal findings: Secondary | ICD-10-CM | POA: Diagnosis not present

## 2021-07-06 DIAGNOSIS — Z79899 Other long term (current) drug therapy: Secondary | ICD-10-CM | POA: Diagnosis not present

## 2021-10-19 ENCOUNTER — Encounter (HOSPITAL_COMMUNITY): Payer: Self-pay

## 2021-10-19 ENCOUNTER — Other Ambulatory Visit: Payer: Self-pay

## 2021-10-19 ENCOUNTER — Emergency Department (HOSPITAL_COMMUNITY)
Admission: EM | Admit: 2021-10-19 | Discharge: 2021-10-19 | Disposition: A | Discharge status: Home / Self Care | Payer: Medicare HMO | Attending: Emergency Medicine | Admitting: Emergency Medicine

## 2021-10-19 ENCOUNTER — Emergency Department (HOSPITAL_COMMUNITY): Payer: Medicare HMO

## 2021-10-19 DIAGNOSIS — S82044A Nondisplaced comminuted fracture of right patella, initial encounter for closed fracture: Secondary | ICD-10-CM | POA: Insufficient documentation

## 2021-10-19 DIAGNOSIS — F17210 Nicotine dependence, cigarettes, uncomplicated: Secondary | ICD-10-CM | POA: Diagnosis not present

## 2021-10-19 DIAGNOSIS — S8991XA Unspecified injury of right lower leg, initial encounter: Secondary | ICD-10-CM | POA: Diagnosis present

## 2021-10-19 DIAGNOSIS — W01198A Fall on same level from slipping, tripping and stumbling with subsequent striking against other object, initial encounter: Secondary | ICD-10-CM | POA: Diagnosis not present

## 2021-10-19 DIAGNOSIS — W19XXXA Unspecified fall, initial encounter: Secondary | ICD-10-CM

## 2021-10-19 LAB — I-STAT BETA HCG BLOOD, ED (MC, WL, AP ONLY): I-stat hCG, quantitative: <5 m[IU]/mL (ref <5)

## 2021-10-19 MED ORDER — OXYCODONE-ACETAMINOPHEN 5-325 MG PO TABS
1.0000 | ORAL_TABLET | Freq: Once | ORAL | Status: DC
  Filled 2021-10-19: qty 1

## 2021-10-19 MED ORDER — HYDROMORPHONE HCL 1 MG/ML IJ SOLN
1.0000 mg | Freq: Once | INTRAMUSCULAR | Status: AC
  Administered 2021-10-19: 1 mg via INTRAVENOUS
  Filled 2021-10-19: qty 1

## 2021-10-19 NOTE — Discharge Instructions (Addendum)
Wanda Warner  You were recently seen at [facility] Emergency room for a knee fracture. We took Xrays of your leg which showed you broke your knee cap. We gave you pain medications and immobilized your knee. You should schedule an appointment to see the orthopedic doctor to see if you need surgery or any physical therapy to help this heal. Take your oxycodone that you have been prescribed by another provider and supplement with ibuprofen and tylenol as needed for pain. Use your crutches and do not try and walk on your knee until you see the orthopedic doctor.

## 2021-10-19 NOTE — ED Triage Notes (Signed)
Pt BIB [facility]. Patient was crosing a medium when she slipped and fell and tried to catch herself with her hands. Patient hit her bottom lip, however bleeding is controlled. There is obvious deformity to the right knee. Patient has a hx of right femur fracture. Patient states she did hit her head but did not loose consciousness. Patient states she takes ritalin.   Vital signs were:  140/98 116-HR 98% on room air 97.0 Temp

## 2021-10-19 NOTE — ED Provider Notes (Signed)
[facility]-EMERGENCY DEPT Provider Note   CSN: [id number] Arrival date & time: 10/19/21  1605     History Past Medical History:  Diagnosis Date   Anxiety    Arthritis    Knees and back    Headache    Loss of smell    Thoracic compression fracture (HCC)    Traumatic brain injury 07/28/13   Social History   Socioeconomic History   Marital status: Single    Spouse name: Not on file   Number of children: Not on file   Years of education: Not on file   Highest education level: Not on file  Occupational History   Not on file  Tobacco Use   Smoking status: Every Day    Packs/day: 0.05    Years: 10.00    Pack years: 0.50    Types: Cigarettes   Smokeless tobacco: Never  Substance and Sexual Activity   Alcohol use: No    Alcohol/week: 2.0 standard drinks    Types: 2 Cans of beer per week   Drug use: No   Sexual activity: Yes    Birth control/protection: None  Other Topics Concern   Not on file  Social History Narrative   Lives in [place].    Hobbies: play solitaire and reading         Social Determinants of Corporate investment banker Strain: Not on file  Food Insecurity: Not on file  Transportation Needs: Not on file  Physical Activity: Not on file  Stress: Not on file  Social Connections: Not on file  Intimate Partner Violence: Not on file    No chief complaint on file.   Wanda Warner is a 41 y.o. female.  Patient is a 41 y.o. F who fell crossing the road and hit her knee. She heard a crunch when she fell and she is worried she broke her knee. The rest of her extremities are ok. She did split her lip when she fell but has had no loss of consciousness. She denies any numbness of tingling of her leg and is able to move her toes and has full sensation in her leg. She has not tried to bear weight on her leg because of the pain.        Home Medications Prior to Admission medications   Medication Sig Start Date End Date Taking?  Authorizing Provider  ALPRAZolam Prudy Feeler) 0.25 MG tablet TAKE 1 TABLET BY MOUTH DAILY AS NEEDED 04/09/20   Ranelle Oyster, MD  clonazePAM (KLONOPIN) 0.5 MG tablet TAKE 1 TABLET BY MOUTH TWICE DAILY 03/02/21   Ranelle Oyster, MD  diclofenac (VOLTAREN) 50 MG EC tablet Take 1 tablet (50 mg total) by mouth 2 (two) times daily. 09/24/20   Ranelle Oyster, MD  methylphenidate (RITALIN) 10 MG tablet Take 1 1/2 tabs 3x daily. 03/03/21   Ranelle Oyster, MD  Multiple Vitamin (MULITIVITAMIN WITH MINERALS) TABS Take 1 tablet by mouth daily.    [provider]  topiramate (TOPAMAX) 50 MG tablet TAKE 1 TABLET BY MOUTH EACH NIGHT AT BEDTIME 11/03/20   Ranelle Oyster, MD      Allergies    Cymbalta [duloxetine hcl]    Review of Systems   Review of Systems  Musculoskeletal:  Positive for gait problem and joint swelling.  Neurological:  Negative for numbness.   Physical Exam Updated Vital Signs BP 109/78    Pulse 96    Resp 18    Ht  5\' 3"  (1.6 m)    Wt 78.9 kg    SpO2 99%    BMI 30.81 kg/m  Physical Exam Constitutional:      Appearance: Normal appearance.  Eyes:     Extraocular Movements: Extraocular movements intact.  Cardiovascular:     Rate and Rhythm: Normal rate.  Pulmonary:     Effort: Pulmonary effort is normal.  Abdominal:     General: Abdomen is flat.  Musculoskeletal:     Right knee: Swelling, deformity and bony tenderness present. Tenderness present.     Right foot: Normal range of motion. No swelling or deformity. Normal pulse.  Skin:    General: Skin is warm and dry.  Neurological:     Mental Status: She is alert.    ED Results / Procedures / Treatments   Labs (all labs ordered are listed, but only abnormal results are displayed) Labs Reviewed  I-STAT BETA HCG BLOOD, ED ([facility], [facility], [facility] ONLY)    EKG None  Radiology DG Knee 2 Views Right  Result Date: 10/19/2021 CLINICAL DATA:  Fall EXAM: RIGHT KNEE - 1-2 VIEW COMPARISON:  None. FINDINGS: Acute fracture of  the patella with approximately 1.4 cm of distraction. Mild comminution. Surrounding soft tissue swelling. Joint effusion is present with possible small fluid level. IMPRESSION: Acute mildly comminuted fracture of the patella with distraction. Joint effusion is present with possible small fluid level. Electronically Signed   By: 12/17/2021 M.D.   On: 10/19/2021 17:07    Procedures Procedures   Medications Ordered in ED Medications  HYDROmorphone (DILAUDID) injection 1 mg (1 mg Intravenous Given 10/19/21 1717)    ED Course/ Medical Decision Making/ A&P                           Medical Decision Making  Patient has knee deformity concerning for patellar fracture, confirmed by XR imaging reviewed personally by me. DP and PT pulses intact, sensation normal, and patient able to move injured extremity distally without pain. Considered head imaging given fall however patient is alert and oriented and does not have any swelling or abrasions of the head to warrant imaging at this time. Will give patient crutches, immobilize her knee, and have her follow up with ortho as an outpatient. Per review of PDMP she already has a large amount of oxy prescribed regularly, will have her continue to use her home medications as well as tylenol and ibuprofen for pain control.   Final Clinical Impression(s) / ED Diagnoses Final diagnoses:  Closed nondisplaced comminuted fracture of right patella, initial encounter    Rx / DC Orders ED Discharge Orders     None         12/17/21, MD 10/19/21 1729    12/17/21, DO 10/19/21 1730

## 2021-10-22 DIAGNOSIS — S82001A Unspecified fracture of right patella, initial encounter for closed fracture: Secondary | ICD-10-CM | POA: Diagnosis present

## 2021-10-23 ENCOUNTER — Other Ambulatory Visit: Payer: Self-pay

## 2021-10-23 ENCOUNTER — Encounter (HOSPITAL_COMMUNITY): Payer: Self-pay | Admitting: Orthopedic Surgery

## 2021-10-23 NOTE — Pre-Procedure Instructions (Signed)
°   Wanda Warner  10/23/2021    Your procedure is scheduled on Sunday, October 25, 2021 at 7:30 AM.   Report to [facility] Registration Desk at 6:00 AM.             They will escort you to the Surgical Short Stay Department once registered.               If you're running late or cannot come, please call [phone] at 6:00 AM.   Remember:  Do not eat food after midnight Saturday, 10/24/21.  You may drink clear liquids until 4:30 AM. Clear liquids allowed are: Water, Juice (non-citric and without pulp - diabetics please choose diet or no sugar options), Carbonated beverages - (diabetics please choose diet or no sugar options), Clear Tea, Black Coffee only (no creamer, milk or cream including half and half), and Gatorade (diabetics please choose diet or no sugar options)    Take these medicines the morning of surgery with A SIP OF WATER: Clonazepam (Klonopin), Oxycodone - if needed  Stop Vitamins and Ibuprofen as of today prior to surgery. Do not use Aspirin products, other NSAIDS (Aleve, Naproxen, etc), Fish Oil or Herbal medications prior to surgery.    Do not wear jewelry, make-up or nail polish.  Do not wear lotions, powders, perfumes or deodorant.  Do not shave 48 hours prior to surgery.    Do not bring valuables to the hospital.  [facility] is not responsible for any belongings or valuables.  Contacts, dentures or bridgework may not be worn into surgery.  Leave your suitcase in the car.  After surgery it may be brought to your room.  For patients admitted to the hospital, discharge time will be determined by your treatment team.  Patients discharged the day of surgery will not be allowed to drive home.   [place] - Preparing for Surgery   1.  Shower with Dial Soap the night before surgery and the morning of Surgery.  2.  If you choose to wash your hair, wash your hair first as usual with your normal shampoo.  3.  After you shampoo, rinse your hair and body  thoroughly to remove the shampoo.  4.  Wash thoroughly, paying special attention to the area where your surgery will be performed.  5.  Thoroughly rinse your body with warm water from the neck down. 6.  Pat yourself dry with a clean towel.            7.  Wear clean pajamas.            8.  Place clean sheets on your bed the night of your first shower and do not sleep with pets.  Day of Surgery  Do not apply any lotions/deodorants the morning of surgery.   Please wear clean clothes to the hospital Remember to brush your teeth with toothpaste.

## 2021-10-23 NOTE — Progress Notes (Signed)
Spoke with pt for pre-op call. Pt denies cardiac history, HTN or Diabetes.  Pt requested that I email her the instructions. Instructions emailed to her this AM.   Will need Covid test on arrival since pt is scheduled as OP in bed.

## 2021-10-25 ENCOUNTER — Inpatient Hospital Stay (HOSPITAL_COMMUNITY)
Admission: RE | Admit: 2021-10-25 | Discharge: 2021-10-30 | DRG: 516 | Disposition: A | Discharge status: Home Health | Payer: Medicare HMO | Attending: Orthopedic Surgery | Admitting: Orthopedic Surgery

## 2021-10-25 ENCOUNTER — Encounter (HOSPITAL_COMMUNITY)
Admission: RE | Disposition: A | Discharge status: Home Health | Payer: Self-pay | Source: Home / Self Care | Attending: Orthopedic Surgery

## 2021-10-25 ENCOUNTER — Ambulatory Visit (HOSPITAL_COMMUNITY): Payer: Medicare HMO

## 2021-10-25 ENCOUNTER — Ambulatory Visit (HOSPITAL_COMMUNITY): Payer: Medicare HMO | Admitting: Certified Registered"

## 2021-10-25 ENCOUNTER — Encounter (HOSPITAL_COMMUNITY): Payer: Self-pay | Admitting: Orthopedic Surgery

## 2021-10-25 ENCOUNTER — Other Ambulatory Visit: Payer: Self-pay

## 2021-10-25 DIAGNOSIS — Z888 Allergy status to other drugs, medicaments and biological substances status: Secondary | ICD-10-CM

## 2021-10-25 DIAGNOSIS — S82041A Displaced comminuted fracture of right patella, initial encounter for closed fracture: Principal | ICD-10-CM | POA: Diagnosis present

## 2021-10-25 DIAGNOSIS — T148XXA Other injury of unspecified body region, initial encounter: Secondary | ICD-10-CM

## 2021-10-25 DIAGNOSIS — S82041B Displaced comminuted fracture of right patella, initial encounter for open fracture type I or II: Secondary | ICD-10-CM | POA: Diagnosis present

## 2021-10-25 DIAGNOSIS — F419 Anxiety disorder, unspecified: Secondary | ICD-10-CM | POA: Diagnosis present

## 2021-10-25 DIAGNOSIS — Y9301 Activity, walking, marching and hiking: Secondary | ICD-10-CM | POA: Diagnosis present

## 2021-10-25 DIAGNOSIS — W19XXXA Unspecified fall, initial encounter: Secondary | ICD-10-CM | POA: Diagnosis present

## 2021-10-25 DIAGNOSIS — S82001A Unspecified fracture of right patella, initial encounter for closed fracture: Secondary | ICD-10-CM | POA: Diagnosis present

## 2021-10-25 DIAGNOSIS — S52615A Nondisplaced fracture of left ulna styloid process, initial encounter for closed fracture: Secondary | ICD-10-CM | POA: Diagnosis present

## 2021-10-25 DIAGNOSIS — F1120 Opioid dependence, uncomplicated: Secondary | ICD-10-CM | POA: Diagnosis present

## 2021-10-25 DIAGNOSIS — Z833 Family history of diabetes mellitus: Secondary | ICD-10-CM

## 2021-10-25 DIAGNOSIS — M1990 Unspecified osteoarthritis, unspecified site: Secondary | ICD-10-CM | POA: Diagnosis present

## 2021-10-25 DIAGNOSIS — Z8249 Family history of ischemic heart disease and other diseases of the circulatory system: Secondary | ICD-10-CM

## 2021-10-25 DIAGNOSIS — Z20822 Contact with and (suspected) exposure to covid-19: Secondary | ICD-10-CM | POA: Diagnosis present

## 2021-10-25 DIAGNOSIS — Z608 Other problems related to social environment: Secondary | ICD-10-CM | POA: Diagnosis present

## 2021-10-25 DIAGNOSIS — F32A Depression, unspecified: Secondary | ICD-10-CM | POA: Diagnosis present

## 2021-10-25 DIAGNOSIS — M25532 Pain in left wrist: Secondary | ICD-10-CM

## 2021-10-25 DIAGNOSIS — Z87820 Personal history of traumatic brain injury: Secondary | ICD-10-CM

## 2021-10-25 DIAGNOSIS — Z79899 Other long term (current) drug therapy: Secondary | ICD-10-CM

## 2021-10-25 DIAGNOSIS — Y92411 Interstate highway as the place of occurrence of the external cause: Secondary | ICD-10-CM

## 2021-10-25 HISTORY — DX: Depression, unspecified: F32.A

## 2021-10-25 HISTORY — PX: ORIF PATELLA: SHX5033

## 2021-10-25 LAB — CBC
HCT: 38.2 % (ref 36.0–46.0)
Hemoglobin: 12.5 g/dL (ref 12.0–15.0)
MCH: 32.2 pg (ref 26.0–34.0)
MCHC: 32.7 g/dL (ref 30.0–36.0)
MCV: 98.5 fL (ref 80.0–100.0)
Platelets: 335 10*3/uL (ref 150–400)
RBC: 3.88 MIL/uL (ref 3.87–5.11)
RDW: 13.2 % (ref 11.5–15.5)
WBC: 13.2 10*3/uL — ABNORMAL HIGH (ref 4.0–10.5)
nRBC: 0 % (ref 0.0–0.2)

## 2021-10-25 LAB — BASIC METABOLIC PANEL
Anion gap: 15 (ref 5–15)
BUN: 12 mg/dL (ref 6–20)
CO2: 19 mmol/L — ABNORMAL LOW (ref 22–32)
Calcium: 9.7 mg/dL (ref 8.9–10.3)
Chloride: 107 mmol/L (ref 98–111)
Creatinine, Ser: 0.7 mg/dL (ref 0.44–1.00)
GFR, Estimated: >60 mL/min (ref >60)
Glucose, Bld: 97 mg/dL (ref 70–99)
Potassium: 3.4 mmol/L — ABNORMAL LOW (ref 3.5–5.1)
Sodium: 141 mmol/L (ref 135–145)

## 2021-10-25 LAB — SARS CORONAVIRUS 2 BY RT PCR (HOSPITAL ORDER, PERFORMED IN ~~LOC~~ HOSPITAL LAB): SARS Coronavirus 2: NEGATIVE

## 2021-10-25 LAB — POCT PREGNANCY, URINE: Preg Test, Ur: NEGATIVE

## 2021-10-25 SURGERY — OPEN REDUCTION INTERNAL FIXATION (ORIF) PATELLA
Anesthesia: General | Site: Knee | Laterality: Right

## 2021-10-25 MED ORDER — PROPOFOL 10 MG/ML IV BOLUS
INTRAVENOUS | Status: AC
  Filled 2021-10-25: qty 20

## 2021-10-25 MED ORDER — SODIUM CHLORIDE 0.9 % IR SOLN
Status: DC | PRN
  Administered 2021-10-25: 1000 mL

## 2021-10-25 MED ORDER — METHYLPHENIDATE HCL 5 MG PO TABS
10.0000 mg | ORAL_TABLET | Freq: Four times a day (QID) | ORAL | Status: DC
  Administered 2021-10-26 – 2021-10-30 (×10): 10 mg via ORAL
  Filled 2021-10-25 (×15): qty 2

## 2021-10-25 MED ORDER — OXYCODONE HCL 5 MG/5ML PO SOLN: 5.0000 mg | Freq: Once | ORAL | Status: DC | PRN

## 2021-10-25 MED ORDER — ACETAMINOPHEN 325 MG PO TABS: 325.0000 mg | ORAL_TABLET | Freq: Four times a day (QID) | ORAL | Status: DC | PRN

## 2021-10-25 MED ORDER — PHENYLEPHRINE 40 MCG/ML (10ML) SYRINGE FOR IV PUSH (FOR BLOOD PRESSURE SUPPORT)
PREFILLED_SYRINGE | INTRAVENOUS | Status: AC
  Filled 2021-10-25: qty 10

## 2021-10-25 MED ORDER — SENNA 8.6 MG PO TABS
1.0000 | ORAL_TABLET | Freq: Two times a day (BID) | ORAL | Status: DC
  Administered 2021-10-25 – 2021-10-30 (×3): 8.6 mg via ORAL
  Filled 2021-10-25 (×9): qty 1

## 2021-10-25 MED ORDER — LIDOCAINE 2% (20 MG/ML) 5 ML SYRINGE
INTRAMUSCULAR | Status: AC
  Filled 2021-10-25: qty 5

## 2021-10-25 MED ORDER — KETAMINE HCL 10 MG/ML IJ SOLN
INTRAMUSCULAR | Status: DC | PRN
  Administered 2021-10-25 (×5): 10 mg via INTRAVENOUS

## 2021-10-25 MED ORDER — METHOCARBAMOL 500 MG PO TABS
500.0000 mg | ORAL_TABLET | Freq: Four times a day (QID) | ORAL | Status: DC | PRN
  Administered 2021-10-25 – 2021-10-30 (×9): 500 mg via ORAL
  Filled 2021-10-25 (×9): qty 1

## 2021-10-25 MED ORDER — FENTANYL CITRATE (PF) 250 MCG/5ML IJ SOLN
INTRAMUSCULAR | Status: DC | PRN
Start: 2021-10-25 — End: 2021-10-25
  Administered 2021-10-25 (×3): 50 ug via INTRAVENOUS

## 2021-10-25 MED ORDER — OXYCODONE HCL 5 MG PO TABS: 5.0000 mg | ORAL_TABLET | Freq: Once | ORAL | Status: DC | PRN

## 2021-10-25 MED ORDER — GLYCOPYRROLATE PF 0.2 MG/ML IJ SOSY
PREFILLED_SYRINGE | INTRAMUSCULAR | Status: AC
  Filled 2021-10-25: qty 1

## 2021-10-25 MED ORDER — HYDROMORPHONE HCL 1 MG/ML IJ SOLN
0.2500 mg | INTRAMUSCULAR | Status: DC | PRN
  Administered 2021-10-25 (×2): 0.5 mg via INTRAVENOUS

## 2021-10-25 MED ORDER — CLONAZEPAM 0.5 MG PO TABS
0.5000 mg | ORAL_TABLET | Freq: Two times a day (BID) | ORAL | Status: DC
  Administered 2021-10-25 – 2021-10-30 (×10): 0.5 mg via ORAL
  Filled 2021-10-25 (×10): qty 1

## 2021-10-25 MED ORDER — MEPERIDINE HCL 25 MG/ML IJ SOLN: 6.2500 mg | INTRAMUSCULAR | Status: DC | PRN

## 2021-10-25 MED ORDER — CEFAZOLIN SODIUM-DEXTROSE 2-4 GM/100ML-% IV SOLN
2.0000 g | INTRAVENOUS | Status: AC
  Administered 2021-10-25: 2 g via INTRAVENOUS

## 2021-10-25 MED ORDER — OXYCODONE HCL 5 MG PO TABS
10.0000 mg | ORAL_TABLET | Freq: Once | ORAL | Status: AC
  Administered 2021-10-25: 10 mg via ORAL

## 2021-10-25 MED ORDER — CHLORHEXIDINE GLUCONATE 0.12 % MT SOLN
OROMUCOSAL | Status: AC
  Administered 2021-10-25: 15 mL
  Filled 2021-10-25: qty 15

## 2021-10-25 MED ORDER — OXYCODONE HCL 5 MG PO TABS
ORAL_TABLET | ORAL | Status: AC
  Filled 2021-10-25: qty 2

## 2021-10-25 MED ORDER — PROPOFOL 10 MG/ML IV BOLUS
INTRAVENOUS | Status: DC | PRN
  Administered 2021-10-25: 150 mg via INTRAVENOUS
  Administered 2021-10-25: 50 mg via INTRAVENOUS

## 2021-10-25 MED ORDER — OXYCODONE HCL 5 MG PO TABS
20.0000 mg | ORAL_TABLET | Freq: Four times a day (QID) | ORAL | Status: DC | PRN
  Administered 2021-10-25 – 2021-10-26 (×2): 20 mg via ORAL
  Filled 2021-10-25 (×2): qty 4

## 2021-10-25 MED ORDER — LACTATED RINGERS IV SOLN: INTRAVENOUS | Status: DC

## 2021-10-25 MED ORDER — METOCLOPRAMIDE HCL 5 MG PO TABS
5.0000 mg | ORAL_TABLET | Freq: Three times a day (TID) | ORAL | Status: DC | PRN
  Administered 2021-10-30: 10 mg via ORAL
  Filled 2021-10-25: qty 2

## 2021-10-25 MED ORDER — OXYCODONE HCL 5 MG PO TABS
10.0000 mg | ORAL_TABLET | Freq: Four times a day (QID) | ORAL | Status: DC | PRN
  Administered 2021-10-25 – 2021-10-26 (×2): 10 mg via ORAL
  Filled 2021-10-25: qty 2

## 2021-10-25 MED ORDER — ACETAMINOPHEN 500 MG PO TABS
1000.0000 mg | ORAL_TABLET | Freq: Four times a day (QID) | ORAL | Status: AC
  Administered 2021-10-25 – 2021-10-26 (×4): 1000 mg via ORAL
  Filled 2021-10-25 (×4): qty 2

## 2021-10-25 MED ORDER — OXYCODONE HCL 5 MG PO TABS: 5.0000 mg | ORAL_TABLET | Freq: Once | ORAL | Status: AC

## 2021-10-25 MED ORDER — METHOCARBAMOL 1000 MG/10ML IJ SOLN
500.0000 mg | Freq: Four times a day (QID) | INTRAVENOUS | Status: DC | PRN
  Filled 2021-10-25: qty 5

## 2021-10-25 MED ORDER — ACETAMINOPHEN 500 MG PO TABS
ORAL_TABLET | ORAL | Status: AC
  Administered 2021-10-25: 1000 mg via ORAL
  Filled 2021-10-25: qty 2

## 2021-10-25 MED ORDER — KETAMINE HCL 50 MG/5ML IJ SOSY
PREFILLED_SYRINGE | INTRAMUSCULAR | Status: AC
  Filled 2021-10-25: qty 5

## 2021-10-25 MED ORDER — FENTANYL CITRATE (PF) 250 MCG/5ML IJ SOLN
INTRAMUSCULAR | Status: AC
  Filled 2021-10-25: qty 5

## 2021-10-25 MED ORDER — ONDANSETRON HCL 4 MG/2ML IJ SOLN: 4.0000 mg | Freq: Four times a day (QID) | INTRAMUSCULAR | Status: DC | PRN

## 2021-10-25 MED ORDER — GLYCOPYRROLATE 0.2 MG/ML IJ SOLN
INTRAMUSCULAR | Status: DC | PRN
  Administered 2021-10-25: .2 mg via INTRAVENOUS

## 2021-10-25 MED ORDER — ACETAMINOPHEN 500 MG PO TABS: 1000.0000 mg | ORAL_TABLET | Freq: Once | ORAL | Status: AC

## 2021-10-25 MED ORDER — CHLORHEXIDINE GLUCONATE 0.12 % MT SOLN: 15.0000 mL | Freq: Once | OROMUCOSAL | Status: DC

## 2021-10-25 MED ORDER — LACTATED RINGERS IV SOLN: INTRAVENOUS | Status: DC | PRN

## 2021-10-25 MED ORDER — POVIDONE-IODINE 10 % EX SWAB: 2.0000 "application " | Freq: Once | CUTANEOUS | Status: DC

## 2021-10-25 MED ORDER — ONDANSETRON HCL 4 MG PO TABS
4.0000 mg | ORAL_TABLET | Freq: Four times a day (QID) | ORAL | Status: DC | PRN
Start: 2021-10-25 — End: 2021-10-30

## 2021-10-25 MED ORDER — OXYCODONE HCL 5 MG PO TABS
ORAL_TABLET | ORAL | Status: AC
  Administered 2021-10-25: 5 mg via ORAL
  Filled 2021-10-25: qty 1

## 2021-10-25 MED ORDER — LIDOCAINE HCL (CARDIAC) PF 100 MG/5ML IV SOSY
PREFILLED_SYRINGE | INTRAVENOUS | Status: DC | PRN
  Administered 2021-10-25: 60 mg via INTRATRACHEAL

## 2021-10-25 MED ORDER — CEFAZOLIN SODIUM-DEXTROSE 2-4 GM/100ML-% IV SOLN
INTRAVENOUS | Status: AC
  Filled 2021-10-25: qty 100

## 2021-10-25 MED ORDER — ENOXAPARIN SODIUM 40 MG/0.4ML IJ SOSY
40.0000 mg | PREFILLED_SYRINGE | INTRAMUSCULAR | Status: DC
  Administered 2021-10-26 – 2021-10-30 (×4): 40 mg via SUBCUTANEOUS
  Filled 2021-10-25 (×5): qty 0.4

## 2021-10-25 MED ORDER — TOPIRAMATE 25 MG PO TABS
50.0000 mg | ORAL_TABLET | Freq: Every day | ORAL | Status: DC
  Administered 2021-10-25 – 2021-10-30 (×5): 50 mg via ORAL
  Filled 2021-10-25 (×6): qty 2

## 2021-10-25 MED ORDER — POLYETHYLENE GLYCOL 3350 17 G PO PACK
17.0000 g | PACK | Freq: Every day | ORAL | Status: DC | PRN
  Administered 2021-10-30: 17 g via ORAL
  Filled 2021-10-25: qty 1

## 2021-10-25 MED ORDER — MIDAZOLAM HCL 2 MG/2ML IJ SOLN
INTRAMUSCULAR | Status: DC | PRN
  Administered 2021-10-25: 2 mg via INTRAVENOUS

## 2021-10-25 MED ORDER — METOCLOPRAMIDE HCL 5 MG/ML IJ SOLN: 5.0000 mg | Freq: Three times a day (TID) | INTRAMUSCULAR | Status: DC | PRN

## 2021-10-25 MED ORDER — ROPIVACAINE HCL 5 MG/ML IJ SOLN
INTRAMUSCULAR | Status: DC | PRN
  Administered 2021-10-25: 30 mL via PERINEURAL

## 2021-10-25 MED ORDER — MIDAZOLAM HCL 2 MG/2ML IJ SOLN
INTRAMUSCULAR | Status: AC
  Filled 2021-10-25: qty 2

## 2021-10-25 MED ORDER — DIPHENHYDRAMINE HCL 12.5 MG/5ML PO ELIX: 12.5000 mg | ORAL_SOLUTION | ORAL | Status: DC | PRN

## 2021-10-25 MED ORDER — HYDROMORPHONE HCL 1 MG/ML IJ SOLN
0.5000 mg | INTRAMUSCULAR | Status: DC | PRN
  Administered 2021-10-25 (×2): 1 mg via INTRAVENOUS
  Administered 2021-10-26: 0.5 mg via INTRAVENOUS
  Administered 2021-10-26 – 2021-10-27 (×5): 1 mg via INTRAVENOUS
  Administered 2021-10-27: 0.5 mg via INTRAVENOUS
  Administered 2021-10-27 – 2021-10-29 (×7): 1 mg via INTRAVENOUS
  Filled 2021-10-25 (×16): qty 1

## 2021-10-25 MED ORDER — PHENYLEPHRINE 40 MCG/ML (10ML) SYRINGE FOR IV PUSH (FOR BLOOD PRESSURE SUPPORT)
PREFILLED_SYRINGE | INTRAVENOUS | Status: DC | PRN
  Administered 2021-10-25 (×3): 40 ug via INTRAVENOUS

## 2021-10-25 MED ORDER — ROPIVACAINE HCL 5 MG/ML IJ SOLN: INTRAMUSCULAR | Status: DC | PRN

## 2021-10-25 MED ORDER — HYDROMORPHONE HCL 1 MG/ML IJ SOLN
INTRAMUSCULAR | Status: AC
  Administered 2021-10-25: 0.5 mg via INTRAVENOUS
  Filled 2021-10-25: qty 1

## 2021-10-25 MED ORDER — CLONIDINE HCL (ANALGESIA) 100 MCG/ML EP SOLN
EPIDURAL | Status: DC | PRN
  Administered 2021-10-25: 100 ug

## 2021-10-25 MED ORDER — POVIDONE-IODINE 7.5 % EX SOLN
Freq: Once | CUTANEOUS | Status: DC
  Filled 2021-10-25: qty 118

## 2021-10-25 MED ORDER — CEFAZOLIN SODIUM-DEXTROSE 2-4 GM/100ML-% IV SOLN
2.0000 g | Freq: Four times a day (QID) | INTRAVENOUS | Status: AC
  Administered 2021-10-25 – 2021-10-26 (×3): 2 g via INTRAVENOUS
  Filled 2021-10-25 (×3): qty 100

## 2021-10-25 MED ORDER — DOCUSATE SODIUM 100 MG PO CAPS
100.0000 mg | ORAL_CAPSULE | Freq: Two times a day (BID) | ORAL | Status: DC
  Administered 2021-10-25 – 2021-10-30 (×4): 100 mg via ORAL
  Filled 2021-10-25 (×10): qty 1

## 2021-10-25 MED ORDER — BISACODYL 10 MG RE SUPP: 10.0000 mg | Freq: Every day | RECTAL | Status: DC | PRN

## 2021-10-25 MED ORDER — PROMETHAZINE HCL 25 MG/ML IJ SOLN: 6.2500 mg | INTRAMUSCULAR | Status: DC | PRN

## 2021-10-25 MED ORDER — POTASSIUM CHLORIDE IN NACL 20-0.45 MEQ/L-% IV SOLN
INTRAVENOUS | Status: DC
  Filled 2021-10-25: qty 1000

## 2021-10-25 MED ORDER — DEXAMETHASONE SODIUM PHOSPHATE 4 MG/ML IJ SOLN
INTRAMUSCULAR | Status: DC | PRN
  Administered 2021-10-25: 4 mg via PERINEURAL

## 2021-10-25 MED ORDER — HYDROMORPHONE HCL 1 MG/ML IJ SOLN
0.2500 mg | INTRAMUSCULAR | Status: DC | PRN
  Administered 2021-10-25: 0.5 mg via INTRAVENOUS

## 2021-10-25 MED ORDER — BUPIVACAINE-EPINEPHRINE 0.5% -1:200000 IJ SOLN
INTRAMUSCULAR | Status: AC
  Filled 2021-10-25: qty 1

## 2021-10-25 MED ORDER — ORAL CARE MOUTH RINSE: 15.0000 mL | Freq: Once | OROMUCOSAL | Status: DC

## 2021-10-25 SURGICAL SUPPLY — 64 items
BAG COUNTER SPONGE SURGICOUNT (BAG) ×2 IMPLANT
BANDAGE ESMARK 6X9 LF (GAUZE/BANDAGES/DRESSINGS) IMPLANT
BENZOIN TINCTURE PRP APPL 2/3 (GAUZE/BANDAGES/DRESSINGS) ×2 IMPLANT
BIT DRILL 2.6 CANN (BIT) ×1 IMPLANT
BNDG COHESIVE 6X5 TAN STRL LF (GAUZE/BANDAGES/DRESSINGS) ×2 IMPLANT
BNDG ELASTIC 6X10 VLCR STRL LF (GAUZE/BANDAGES/DRESSINGS) ×2 IMPLANT
BNDG ELASTIC 6X5.8 VLCR STR LF (GAUZE/BANDAGES/DRESSINGS) ×4 IMPLANT
BNDG ESMARK 6X9 LF (GAUZE/BANDAGES/DRESSINGS) ×2
BOOTCOVER CLEANROOM LRG (PROTECTIVE WEAR) ×3 IMPLANT
CANISTER SUCT 3000ML PPV (MISCELLANEOUS) ×2 IMPLANT
CLSR STERI-STRIP ANTIMIC 1/2X4 (GAUZE/BANDAGES/DRESSINGS) ×2 IMPLANT
COVER SURGICAL LIGHT HANDLE (MISCELLANEOUS) ×2 IMPLANT
CUFF TOURN SGL QUICK 34 (TOURNIQUET CUFF) ×1
CUFF TRNQT CYL 34X4.125X (TOURNIQUET CUFF) IMPLANT
DRAPE OEC MINIVIEW 54X84 (DRAPES) ×1 IMPLANT
DRSG PAD ABDOMINAL 8X10 ST (GAUZE/BANDAGES/DRESSINGS) ×4 IMPLANT
DRSG XEROFORM 1X8 (GAUZE/BANDAGES/DRESSINGS) ×1 IMPLANT
DURAPREP 26ML APPLICATOR (WOUND CARE) ×2 IMPLANT
ELECT REM PT RETURN 9FT ADLT (ELECTROSURGICAL) ×2
ELECTRODE REM PT RTRN 9FT ADLT (ELECTROSURGICAL) ×1 IMPLANT
GAUZE SPONGE 4X4 12PLY STRL (GAUZE/BANDAGES/DRESSINGS) ×2 IMPLANT
GAUZE SPONGE 4X4 12PLY STRL LF (GAUZE/BANDAGES/DRESSINGS) ×2 IMPLANT
GAUZE XEROFORM 1X8 LF (GAUZE/BANDAGES/DRESSINGS) ×2 IMPLANT
GLOVE SURG ENC MOIS LTX SZ7 (GLOVE) ×2 IMPLANT
GLOVE SURG ENC MOIS LTX SZ8 (GLOVE) ×2 IMPLANT
GLOVE SURG ORTHO LTX SZ7.5 (GLOVE) ×2 IMPLANT
GLOVE SURG UNDER POLY LF SZ7 (GLOVE) ×2 IMPLANT
GOWN STRL REUS W/ TWL LRG LVL3 (GOWN DISPOSABLE) IMPLANT
GOWN STRL REUS W/TWL 2XL LVL3 (GOWN DISPOSABLE) ×2 IMPLANT
GOWN STRL REUS W/TWL LRG LVL3 (GOWN DISPOSABLE) ×1
GUIDEWIRE 1.35MM  DUAL TROCAR (WIRE) ×2
GUIDEWIRE 1.35MM DUAL TROCAR (WIRE) IMPLANT
IMMOBILIZER KNEE 22 UNIV (SOFTGOODS) IMPLANT
KIT BASIN OR (CUSTOM PROCEDURE TRAY) ×2 IMPLANT
KIT TURNOVER KIT B (KITS) ×2 IMPLANT
NS IRRIG 1000ML POUR BTL (IV SOLUTION) ×2 IMPLANT
PACK ORTHO EXTREMITY (CUSTOM PROCEDURE TRAY) ×2 IMPLANT
PAD ARMBOARD 7.5X6 YLW CONV (MISCELLANEOUS) ×4 IMPLANT
PAD CAST 4YDX4 CTTN HI CHSV (CAST SUPPLIES) ×2 IMPLANT
PADDING CAST ABS 4INX4YD NS (CAST SUPPLIES) ×2
PADDING CAST ABS COTTON 4X4 ST (CAST SUPPLIES) IMPLANT
PADDING CAST COTTON 4X4 STRL (CAST SUPPLIES) ×1
RETRIEVER SUT HEWSON (MISCELLANEOUS) IMPLANT
SCREW 4X36MM CANN LO-PRO (Screw) ×2 IMPLANT
SPLINT PLASTER CAST XFAST 5X30 (CAST SUPPLIES) IMPLANT
SPLINT PLASTER XFAST SET 5X30 (CAST SUPPLIES) ×1
SPONGE T-LAP 4X18 ~~LOC~~+RFID (SPONGE) ×4 IMPLANT
STOCKINETTE IMPERVIOUS LG (DRAPES) ×2 IMPLANT
SUCTION FRAZIER HANDLE 10FR (MISCELLANEOUS) ×1
SUCTION TUBE FRAZIER 10FR DISP (MISCELLANEOUS) IMPLANT
SUT FIBERWIRE #2 38 T-5 BLUE (SUTURE) ×4
SUT VIC AB 0 CT1 27 (SUTURE) ×2
SUT VIC AB 0 CT1 27XBRD ANBCTR (SUTURE) ×1 IMPLANT
SUT VIC AB 2-0 CT1 27 (SUTURE) ×1
SUT VIC AB 2-0 CT1 TAPERPNT 27 (SUTURE) IMPLANT
SUT VIC AB 2-0 CTB1 (SUTURE) IMPLANT
SUT VIC AB 3-0 SH 18 (SUTURE) ×2 IMPLANT
SUT VIC AB 3-0 X1 27 (SUTURE) ×1 IMPLANT
SUTURE FIBERWR #2 38 T-5 BLUE (SUTURE) IMPLANT
TOWEL GREEN STERILE (TOWEL DISPOSABLE) ×2 IMPLANT
TOWEL GREEN STERILE FF (TOWEL DISPOSABLE) ×3 IMPLANT
TUBE CONNECTING 12X1/4 (SUCTIONS) ×2 IMPLANT
UNDERPAD 30X36 HEAVY ABSORB (UNDERPADS AND DIAPERS) ×2 IMPLANT
YANKAUER SUCT BULB TIP NO VENT (SUCTIONS) IMPLANT

## 2021-10-25 NOTE — Anesthesia Procedure Notes (Signed)
Procedure Name: LMA Insertion Date/Time: 10/25/2021 8:00 AM Performed by: Melina Schools, CRNA Pre-anesthesia Checklist: Patient identified, Emergency Drugs available, Suction available, Patient being monitored and Timeout performed Patient Re-evaluated:Patient Re-evaluated prior to induction Oxygen Delivery Method: Circle system utilized Preoxygenation: Pre-oxygenation with 100% oxygen Induction Type: IV induction Ventilation: Mask ventilation without difficulty LMA: LMA inserted LMA Size: 4.0 Number of attempts: 1 Placement Confirmation: positive ETCO2 and breath sounds checked- equal and bilateral Tube secured with: Tape Dental Injury: Teeth and Oropharynx as per pre-operative assessment

## 2021-10-25 NOTE — Plan of Care (Signed)

## 2021-10-25 NOTE — Anesthesia Procedure Notes (Signed)
Anesthesia Regional Block: Femoral nerve block   Pre-Anesthetic Checklist: , timeout performed,  Correct Patient, Correct Site, Correct Laterality,  Correct Procedure, Correct Position, site marked,  Risks and benefits discussed,  Surgical consent,  Pre-op evaluation,  At surgeon's request and post-op pain management  Laterality: Lower and Right  Prep: chloraprep       Needles:  Injection technique: Single-shot  Needle Type: Stimulator Needle - 80     Needle Length: 9cm  Needle Gauge: 22   Needle insertion depth: 6 cm   Additional Needles:   Procedures:,,,, ultrasound used (permanent image in chart),,    Narrative:  Start time: 10/25/2021 7:20 AM End time: 10/25/2021 7:40 AM Injection made incrementally with aspirations every 5 mL.  Performed by: Personally  Anesthesiologist: Lewie Loron, MD  Additional Notes: BP cuff, EKG monitors applied. Sedation begun. Nerve location verified with U/S, anesthetic injected incrementally, slowly, and after negative aspirations under direct u/s guidance. Good perineural spread. Patient tolerated well.

## 2021-10-25 NOTE — Anesthesia Postprocedure Evaluation (Signed)
Anesthesia Post Note  Patient: Wanda Warner  Procedure(s) Performed: OPEN REDUCTION INTERNAL (ORIF) FIXATION PATELLA (Right: Knee)     Patient location during evaluation: PACU Anesthesia Type: General Level of consciousness: sedated and patient cooperative Pain management: pain level controlled Vital Signs Assessment: post-procedure vital signs reviewed and stable Respiratory status: spontaneous breathing Cardiovascular status: stable Anesthetic complications: no   No notable events documented.  Last Vitals:  Vitals:   10/25/21 1115 10/25/21 1128  BP: 110/70 111/71  Pulse: 73 89  Resp: 19 18  Temp: 36.7 C 36.9 C  SpO2: 99% 100%    Last Pain:  Vitals:   10/25/21 1224  TempSrc:   PainSc: 9                  Mathilde Mcwherter
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
## 2021-10-25 NOTE — Op Note (Addendum)
10/25/2021  9:43 AM  PATIENT:  Wanda Warner    PRE-OPERATIVE DIAGNOSIS:  right patella fracture  POST-OPERATIVE DIAGNOSIS:  Same  PROCEDURE:  OPEN REDUCTION INTERNAL (ORIF) FIXATION PATELLA with repair of medial and lateral retinacular structures/capsule         SURGEON:  Eulas Post, MD  PHYSICIAN ASSISTANT: Janine Ores, PA-C, present and scrubbed throughout the case, critical for completion in a timely fashion, and for retraction, instrumentation, and closure.  ANESTHESIA:   General  ESTIMATED BLOOD LOSS: 75 mL  PREOPERATIVE INDICATIONS:  Wanda Warner is a  41 y.o. female with a diagnosis of right patella fracture who elected for surgical management in order to restore the function of the extensor mechanism.  She also has a history of chronic opioid dependency, as well as traumatic brain injury, and is currently going through a separation and was living in a hotel, and trying to walk across [facility] when she fell and broke her patella.  The risks benefits and alternatives were discussed with the patient preoperatively including but not limited to the risks of infection, bleeding, nerve injury, cardiopulmonary complications, the need for revision surgery, hardware prominence, hardware failure, the need for hardware removal, nonunion, malunion, posttraumatic arthritis, stiffness, loss of strength and function, among others, and the patient was willing to proceed.  OPERATIVE IMPLANTS: 4.0 mm cannulated screws x2 from [employer], blunt nosed, with a total of 2 #2 FiberWire going through the cannulated screws in a figure-of-eight cerclage fashion.  I used 0 Vicryl for the capsular repair medially and laterally.  OPERATIVE FINDINGS: Displaced patella fracture with medial and lateral retinacular tears.  The medial patella fracture was fairly proximal, and there was not a lot of proximal bone, for this reason I placed the medialmost screw from proximal to distal in order to try and  minimize risk for cut out.  Having said that, the hold on the medial side was not as good as what I had on the lateral side.  It was squeaky tight on the lateral side, but the medial side the screw tried to pull away with removal of the screwdriver, but the ultimate security was secondary to the cerclage FiberWires.  OPERATIVE PROCEDURE: The patient was brought to the operating room and placed in the supine position. General anesthesia was administered. IV antibiotics were given. The lower extremity was prepped and draped in usual sterile fashion. The leg was elevated and exsanguinated and the tourniquet was inflated. Time out was performed.  We also did a pre-scrub.  She had a femoral nerve block.  Anterior incision was made over the patella and the fracture fragments identified and cleaned of hematoma. The retinaculum was torn on either side.  I reduced the fracture anatomically and held provisionally with a clamp and placed 2 guidewires for the cannulated screws.  The lengths were measured, after being confirmed on C-arm, and then I placed the screws appropriate length was confirmed under C-arm visualization.  The medialmost screw did not have the greatest bite, try to put it down further, and upon attempting to remove the screwdriver it backed the screw out slightly.  I considered making screw longer but did not feel that it was going to provide significant clinical benefit, and the bone piece I was working with was fairly small proximally.  I had oriented the screw from proximal to distal on the side for that reason.  C-arm used to confirm reduction and position of the screws, and once I was satisfied with this  I then used a Keith needle through the screws bringing a total of 2 #2 FiberWire in a figure-of-eight type fashion. This provided excellent secondary fixation. I left the screw lengths slightly short of the far cortex in order to minimize the risk for rupture of the FiberWire over the tip of  the screws.  I did use a free needle to minimize engagement of the distal quadriceps tendon.    I repaired the medial collateral capsule with a 0-Vicryl stitch with interrupted figure of 8 technique.  I also repaired the lateral collateral capsule with a 0-Vicryl stitch with interrupted figure of 8 technique.  The wounds were irrigated copiously and the retinaculum repaired with Vicryl followed by Vicryl for the subcutaneous tissue with Steri-Strips and sterile gauze for the skin. A knee immobilizer was applied. The patient was awakened and returned to the PACU in stable and satisfactory condition. There were no complications.

## 2021-10-25 NOTE — Transfer of Care (Signed)
Immediate Anesthesia Transfer of Care Note  Patient: Wanda Warner  Procedure(s) Performed: OPEN REDUCTION INTERNAL (ORIF) FIXATION PATELLA (Right: Knee)  Patient Location: PACU  Anesthesia Type:GA combined with regional for post-op pain  Level of Consciousness: awake, alert , oriented and patient cooperative  Airway & Oxygen Therapy: Patient Spontanous Breathing and Patient connected to nasal cannula oxygen  Post-op Assessment: Report given to RN, Post -op Vital signs reviewed and stable and Patient moving all extremities X 4  Post vital signs: Reviewed and stable  Last Vitals:  Vitals Value Taken Time  BP 122/75 10/25/21 1019  Temp 36.3 C 10/25/21 1017  Pulse 90 10/25/21 1021  Resp 18 10/25/21 1021  SpO2 100 % 10/25/21 1021  Vitals shown include unvalidated device data.  Last Pain:  Vitals:   10/25/21 0646  TempSrc: Oral  PainSc: 10-Worst pain ever         Complications: No notable events documented.

## 2021-10-25 NOTE — Anesthesia Preprocedure Evaluation (Addendum)
Anesthesia Evaluation  Patient identified by MRN, date of birth, ID band Patient awake    Reviewed: Allergy & Precautions, NPO status , Patient's Chart, lab work & pertinent test results  Airway Mallampati: II  TM Distance: >3 FB Neck ROM: Full    Dental no notable dental hx. (+) Dental Advisory Given, Teeth Intact   Pulmonary neg pulmonary ROS, Current Smoker and Patient abstained from smoking.,    Pulmonary exam normal breath sounds clear to auscultation       Cardiovascular negative cardio ROS Normal cardiovascular exam Rhythm:Regular Rate:Normal     Neuro/Psych  Headaches, PSYCHIATRIC DISORDERS Anxiety Depression    GI/Hepatic negative GI ROS, Neg liver ROS,   Endo/Other  negative endocrine ROS  Renal/GU negative Renal ROS     Musculoskeletal  (+) Arthritis ,   Abdominal   Peds  Hematology negative hematology ROS (+)   Anesthesia Other Findings   Reproductive/Obstetrics                            Anesthesia Physical Anesthesia Plan  ASA: 2  Anesthesia Plan: General   Post-op Pain Management: Regional block, Minimal or no pain anticipated, Dilaudid IV and Ketamine IV   Induction: Intravenous  PONV Risk Score and Plan: 3 and Ondansetron, Dexamethasone, Midazolam and Treatment may vary due to age or medical condition  Airway Management Planned: LMA  Additional Equipment:   Intra-op Plan:   Post-operative Plan: Extubation in OR  Informed Consent: I have reviewed the patients History and Physical, chart, labs and discussed the procedure including the risks, benefits and alternatives for the proposed anesthesia with the patient or authorized representative who has indicated his/her understanding and acceptance.     Dental advisory given  Plan Discussed with: CRNA  Anesthesia Plan Comments:        Anesthesia Quick Evaluation

## 2021-10-26 DIAGNOSIS — Y92411 Interstate highway as the place of occurrence of the external cause: Secondary | ICD-10-CM | POA: Diagnosis not present

## 2021-10-26 DIAGNOSIS — Z8249 Family history of ischemic heart disease and other diseases of the circulatory system: Secondary | ICD-10-CM | POA: Diagnosis not present

## 2021-10-26 DIAGNOSIS — Y9301 Activity, walking, marching and hiking: Secondary | ICD-10-CM | POA: Diagnosis present

## 2021-10-26 DIAGNOSIS — W19XXXA Unspecified fall, initial encounter: Secondary | ICD-10-CM | POA: Diagnosis present

## 2021-10-26 DIAGNOSIS — F32A Depression, unspecified: Secondary | ICD-10-CM | POA: Diagnosis present

## 2021-10-26 DIAGNOSIS — M1990 Unspecified osteoarthritis, unspecified site: Secondary | ICD-10-CM | POA: Diagnosis present

## 2021-10-26 DIAGNOSIS — Z79899 Other long term (current) drug therapy: Secondary | ICD-10-CM | POA: Diagnosis not present

## 2021-10-26 DIAGNOSIS — Z20822 Contact with and (suspected) exposure to covid-19: Secondary | ICD-10-CM | POA: Diagnosis present

## 2021-10-26 DIAGNOSIS — F1120 Opioid dependence, uncomplicated: Secondary | ICD-10-CM | POA: Diagnosis present

## 2021-10-26 DIAGNOSIS — S82041A Displaced comminuted fracture of right patella, initial encounter for closed fracture: Secondary | ICD-10-CM | POA: Diagnosis present

## 2021-10-26 DIAGNOSIS — Z87820 Personal history of traumatic brain injury: Secondary | ICD-10-CM | POA: Diagnosis not present

## 2021-10-26 DIAGNOSIS — Z888 Allergy status to other drugs, medicaments and biological substances status: Secondary | ICD-10-CM | POA: Diagnosis not present

## 2021-10-26 DIAGNOSIS — S52615A Nondisplaced fracture of left ulna styloid process, initial encounter for closed fracture: Secondary | ICD-10-CM | POA: Diagnosis present

## 2021-10-26 DIAGNOSIS — Z608 Other problems related to social environment: Secondary | ICD-10-CM | POA: Diagnosis present

## 2021-10-26 DIAGNOSIS — F419 Anxiety disorder, unspecified: Secondary | ICD-10-CM | POA: Diagnosis present

## 2021-10-26 DIAGNOSIS — Z833 Family history of diabetes mellitus: Secondary | ICD-10-CM | POA: Diagnosis not present

## 2021-10-26 MED ORDER — OXYCODONE HCL 5 MG PO TABS: 10.0000 mg | ORAL_TABLET | Freq: Four times a day (QID) | ORAL | Status: DC | PRN

## 2021-10-26 MED ORDER — OXYCODONE HCL 5 MG PO TABS: 5.0000 mg | ORAL_TABLET | Freq: Four times a day (QID) | ORAL | Status: DC | PRN

## 2021-10-26 MED ORDER — OXYCODONE HCL 5 MG PO TABS
10.0000 mg | ORAL_TABLET | Freq: Four times a day (QID) | ORAL | Status: DC
  Administered 2021-10-26 – 2021-10-28 (×9): 10 mg via ORAL
  Filled 2021-10-26 (×9): qty 2

## 2021-10-26 MED ORDER — ACETAMINOPHEN 325 MG PO TABS
650.0000 mg | ORAL_TABLET | Freq: Four times a day (QID) | ORAL | Status: DC
  Administered 2021-10-26 – 2021-10-29 (×11): 650 mg via ORAL
  Filled 2021-10-26 (×11): qty 2

## 2021-10-26 MED ORDER — OXYCODONE HCL 5 MG PO TABS
15.0000 mg | ORAL_TABLET | Freq: Four times a day (QID) | ORAL | Status: DC | PRN
  Administered 2021-10-27: 15 mg via ORAL
  Filled 2021-10-26: qty 3

## 2021-10-26 MED ORDER — OXYCODONE HCL 5 MG PO TABS: 15.0000 mg | ORAL_TABLET | Freq: Four times a day (QID) | ORAL | Status: DC | PRN

## 2021-10-26 MED ORDER — ACETAMINOPHEN 325 MG PO TABS: 325.0000 mg | ORAL_TABLET | Freq: Four times a day (QID) | ORAL | Status: DC | PRN

## 2021-10-26 NOTE — Plan of Care (Signed)

## 2021-10-26 NOTE — Plan of Care (Signed)

## 2021-10-26 NOTE — Care Management Obs Status (Signed)
MEDICARE OBSERVATION STATUS NOTIFICATION   Patient Details  Name: Wanda Warner MRN: [id number] Date of Birth: 12/02/1980   Medicare Observation Status Notification Given:  Yes    Lorri Frederick, LCSW 10/26/2021, 11:23 AM

## 2021-10-26 NOTE — Evaluation (Signed)
**Note Wanda-Identified via Obfuscation** Physical Therapy Evaluation Patient Details Name: Wanda Warner MRN: [id number] DOB: 1980-11-20 Today's Date: 10/26/2021  History of Present Illness  Admitted with R patellar fx, s/p ORIF on 10/25/2021, NWB;  has a past medical history of Anxiety, Arthritis, Depression, Headache, Loss of smell, Thoracic compression fracture (HCC), and Traumatic brain injury (07/28/2013);  currently living in a hotel with very limited social support, and is on chronic opioids, and was managing very poorly independently  Clinical Impression   Patient is s/p above surgery resulting in functional limitations due to the deficits listed below (see PT Problem List). 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          Recommendations for follow up therapy are one component of a multi-disciplinary discharge planning process, led by the attending physician.  Recommendations may be updated based on patient status, additional functional criteria and insurance authorization.  Follow Up Recommendations Home health PT    Assistance Recommended at Discharge Intermittent Supervision/Assistance  Patient can return home with the following  Other (comment) (It just doesn't sound like assist is available to pt)    Equipment Recommendations Rolling walker (2 wheels);BSC/3in1;Other (comment) (I believe pt already has crutches -- will try to discern  crutches vs RW over next few sessions)  Recommendations for Other Services  OT consult (ordered per protocol)    Functional Status Assessment Patient has had a recent decline in their functional status and demonstrates the ability to make significant improvements in function in a reasonable and predictable amount of time.     Precautions / Restrictions Precautions Precautions: Fall Restrictions RLE Weight Bearing: Non weight bearing      Mobility  Bed Mobility Overal bed mobility: Needs Assistance Bed Mobility: Supine to Sit     Supine to sit: Modified independent (Device/Increase time)     General bed mobility comments: pushed up to long sit without difficulty    Transfers Overall transfer level: Needs assistance Equipment used:  (pillow case under RLEto help move it) Transfers: Bed to chair/wheelchair/BSC         Anterior-Posterior transfers: Mod assist   General transfer comment: light mod assist to steady BSC and assist with moving RLE for optimal angle to move onto and off of BSC    Ambulation/Gait               General Gait Details: refused  Stairs            Wheelchair Mobility    Modified Rankin (Stroke Patients Only)       Balance                                             Pertinent Vitals/Pain Pain Assessment Pain Assessment: 0-10 Pain Score: 10-Worst pain ever Pain Location: R knee Pain Descriptors / Indicators: Crying, Grimacing, Guarding Pain  Intervention(s): Repositioned, Patient requesting pain meds-RN notified    Home Living Family/patient expects to be discharged to:: Other (Comment) [facility]) Living Arrangements: Other (Comment) (None) Available Help at Discharge: Other (Comment) (None) Type of Home: Other(Comment) [facility]) Home Access: Level entry;Elevator       Home Layout: One level   Additional Comments: Has been staying at a hotel; 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; Pt so  distracted by pain -- difficulty getting full information about how she was managing at hotel    Prior Function Prior Level of Function : Independent/Modified Independent (with crutches)             Mobility Comments: used crutches       Hand Dominance        Extremity/Trunk Assessment   Upper Extremity Assessment Upper Extremity Assessment: Defer to OT evaluation;Overall [facility] for tasks assessed    Lower Extremity Assessment Lower Extremity Assessment: RLE deficits/detail RLE Deficits / Details: Extemely limited in all movement due to pain; able to actively flex/extend toes RLE: Unable to fully assess due to pain       Communication   Communication: No difficulties;Other (comment) (except so distracted with anticipation of pain)  Cognition Arousal/Alertness: Awake/alert Behavior During Therapy: Restless, Anxious, Impulsive Overall Cognitive Status: Difficult to assess                                 General Comments: Difficulty attending to instruction, or answering questions due to the degree of focus pt had on pain, pain meds, and anticipation of pain with moving        General Comments      Exercises     Assessment/Plan    PT Assessment Patient needs continued PT services  PT Problem List Decreased strength;Decreased range of motion;Decreased activity tolerance;Decreased balance;Decreased mobility;Decreased knowledge of use of DME;Decreased safety awareness;Decreased knowledge of precautions;Pain       PT Treatment Interventions DME instruction;Gait training;Stair training;Functional mobility training;Therapeutic activities;Therapeutic exercise;Balance training;Patient/family education    PT Goals (Current goals can be found in the Care Plan section)  Acute Rehab PT Goals Patient Stated Goal: decr pain PT Goal Formulation: Patient unable to participate in goal setting Time For Goal Achievement: 11/09/21 Potential to Achieve Goals: Good     Frequency Min 5X/week     Co-evaluation               AM-PAC PT "6 Clicks" Mobility  Outcome Measure Help needed turning from your back to your side while in a flat bed without using bedrails?: A Little Help needed moving from lying on your back to sitting on the side of a flat bed without using bedrails?: A Little Help needed moving to and from a bed to a chair (including a wheelchair)?: A Little Help needed standing up from a chair using your arms (e.g., wheelchair or bedside chair)?: A Little Help needed to walk in hospital room?: A Lot Help needed climbing 3-5 steps with a railing? : A Lot 6 Click Score: 16    End of Session Equipment Utilized During Treatment: Other (comment) (pillow case as cradle for RLE) Activity Tolerance: Patient limited by pain Patient left: in bed;with call bell/phone within reach;with bed alarm set Nurse Communication: Mobility status;Patient requests pain meds PT Visit Diagnosis: Other abnormalities of gait and mobility (R26.89);Pain Pain - Right/Left: Right Pain - part of body: Leg    Time: 9622-2979  PT Time Calculation (min) (ACUTE ONLY): 29 min   Charges:   PT Evaluation $PT Eval Moderate Complexity: 1 Mod PT Treatments $Therapeutic Activity: 8-22 mins        Van Clines, PT  Acute Rehabilitation Services Pager [phone] Office [phone]   Levi Aland 10/26/2021, 6:37 PM

## 2021-10-26 NOTE — Progress Notes (Signed)
Physical Therapy Note  PT eval complete with full note to follow;  Noting difficult situation for home - living in hotel with limited support; Still, has been managing with crutches for a week prior to coming back for surgery;  Recommend HHPT follow up for transition out of hospital;  Will request OT see her for independence with ADLs;    Van Clines, [place]  Acute Rehabilitation Services Pager [phone] Office [phone]

## 2021-10-26 NOTE — Progress Notes (Signed)
° ° ° °  Subjective: 1 Day Post-Op s/p Procedure(s): OPEN REDUCTION INTERNAL (ORIF) FIXATION PATELLA   Patient is alert, oriented. Laying in bed. States she is in severe pain, current pain medication is not doing enough. She is tearful, mainly frustrated due to lack of sleep.  Denies chest pain, SOB, Calf pain. No nausea/vomiting. No paraesthesias. No other complaints.   Objective:  PE: VITALS:   Vitals:   10/25/21 1115 10/25/21 1128 10/25/21 2100 10/25/21 2117  BP: 110/70 111/71 (!) 115/50 112/66  Pulse: 73 89 88 65  Resp: 19 18 19    Temp: 98 F (36.7 C) 98.5 F (36.9 C) 98 F (36.7 C)   TempSrc:  Oral Oral   SpO2: 99% 100% 98%   Weight:      Height:       General: laying in bed, in no acute distress, but tearful Resp:  normal respiratory effort GI: soft, nontender MSK: RLE in long leg splint. Dorsiflexion and plantarflexion intact. Sensation intact to all aspects of foot. 2+ DP Pulse. No significant foot edema.   LABS  No results found for this or any previous visit (from the past 24 hour(s)).  DG MINI C-ARM IMAGE ONLY  Result Date: 10/25/2021 There is no interpretation for this exam.  This order is for images obtained during a surgical procedure.  Please See "Surgeries" Tab for more information regarding the procedure.    Assessment/Plan: 41 yo patient with displaced right patella fracture with history of TBI and opioid dependency   1 Day Post-Op s/p Procedure(s): OPEN REDUCTION INTERNAL (ORIF) FIXATION PATELLA  Weightbearing: NWB RLE Insicional and dressing care: Dressings left intact until follow-up Orthopedic device(s): Long leg splint VTE prophylaxis: Lovenox 40 mg q 24 hours Pain control: Pain control continues to be difficult as expected. She takes percocet 10/325 q 6 hours at baseline with clonazepam BID. I will add on scheduled tramadol and change the scheduling so her 10mg  oxycodone q 6 is scheduled with 10-15 mg q 6 hours as needed for acute pain.  Follow  - up plan: 2 weeks with Dr. 41 Dispo: Pending better pain control and ability to work with PT. She really has no safe discharge plan, she is living alone in a hotel due to a recent separation.  Contact information:   Weekdays 8-5 Dion Saucier, 06-14-2006 Janine Ores A fter hours and holidays please check [email or web address] for group call information for [facility]  [place] 10/26/2021, 10:35 AM

## 2021-10-27 ENCOUNTER — Encounter (HOSPITAL_COMMUNITY): Payer: Self-pay | Admitting: Orthopedic Surgery

## 2021-10-27 MED ORDER — OXYCODONE HCL ER 10 MG PO T12A
10.0000 mg | EXTENDED_RELEASE_TABLET | Freq: Two times a day (BID) | ORAL | Status: DC
  Administered 2021-10-28: 10 mg via ORAL
  Filled 2021-10-27: qty 1

## 2021-10-27 MED ORDER — OXYCODONE HCL ER 10 MG PO T12A
10.0000 mg | EXTENDED_RELEASE_TABLET | Freq: Two times a day (BID) | ORAL | Status: DC
Start: 2021-10-27 — End: 2021-10-27
  Administered 2021-10-27: 10 mg via ORAL
  Filled 2021-10-27: qty 1

## 2021-10-27 MED ORDER — IBUPROFEN 200 MG PO TABS
400.0000 mg | ORAL_TABLET | Freq: Four times a day (QID) | ORAL | Status: DC
  Administered 2021-10-27 – 2021-10-30 (×13): 400 mg via ORAL
  Filled 2021-10-27 (×13): qty 2

## 2021-10-27 NOTE — Progress Notes (Signed)
Physical Therapy Treatment Patient Details Name: Wanda Warner MRN: [id number] DOB: [birth year] Today's Date: 10/27/2021   History of Present Illness Pt is a 41 y/o F presenting to ED on 1/9 after fall while crossing a median, found to have R patellar fx, s/p ORIF on 10/25/2021, NWB;  has a past medical history of Anxiety, Arthritis, Depression, Headache, Loss of smell, Thoracic compression fracture (HCC), and Traumatic brain injury (07/28/2013);  currently living in a hotel with very limited social support, and is on chronic opioids, and was managing very poorly independently    PT Comments    Continuing work on functional mobility and activity tolerance;  Cat is still limited by anxiety with the anticipation of pain with moving, and while we got closer to OOB transfers and helping her stand, ultimately today she declined and returned to supine; She has very good quality of movement in the bed, good hamstring length, and ability to half-bridge well; I anticipate that transfers, single limb stance, gait with RW (and maybe crutches), and even stair training will go well, when she can participate.  It is also encouraging that pt's father is helping to get an apartment for her; at this point, if possible, could it be a more accessible apartment?  Recommendations for follow up therapy are one component of a multi-disciplinary discharge planning process, led by the attending physician.  Recommendations may be updated based on patient status, additional functional criteria and insurance authorization.  Follow Up Recommendations  Home health PT     Assistance Recommended at Discharge Intermittent Supervision/Assistance  Patient can return home with the following Other (comment) (It just doesn't sound like assist is available to pt)   Equipment Recommendations  Rolling walker (2 wheels);BSC/3in1;Other (comment) (I believe pt already has crutches -- will try to discern crutches vs RW over next few  sessions)    Recommendations for Other Services       Precautions / Restrictions Precautions Precautions: Fall Precaution Comments: Fall risk is present, but likely low; cannot assesswell yet as pt hasn't gotten up Required Braces or Orthoses: Splint/Cast (Long leg cast RLE) Restrictions Weight Bearing Restrictions: Yes RLE Weight Bearing: Non weight bearing     Mobility  Bed Mobility Overal bed mobility: Needs Assistance Bed Mobility: Supine to Sit     Supine to sit: Modified independent (Device/Increase time)     General bed mobility comments: able to long sit unsupported to don shoes    Transfers                   General transfer comment: pt refuses to attempt due to anticipated pain    Ambulation/Gait               General Gait Details: refused   Stairs             Wheelchair Mobility    Modified Rankin (Stroke Patients Only)       Balance Overall balance assessment: Needs assistance   Sitting balance-Leahy Scale: Normal                                      Cognition Arousal/Alertness: Awake/alert Behavior During Therapy: Restless, Anxious, Agitated Overall Cognitive Status: No family/caregiver present to determine baseline cognitive functioning  General Comments: preoccupied with thought of pain with moving        Exercises      General Comments General comments (skin integrity, edema, etc.): pt very anxious and emotional throughout session, stressed about living situation and uncontrolled pain, wanting to sleep.      Pertinent Vitals/Pain Pain Assessment Pain Assessment: Faces Pain Score: 10-Worst pain ever Faces Pain Scale: Hurts worst Pain Location: R knee Pain Descriptors / Indicators: Crying, Grimacing, Guarding, Constant, Aching, Discomfort, Operative site guarding Pain Intervention(s): Monitored during session    Home Living Family/patient  expects to be discharged to:: [facility], possibly an apartement her father is arranging for her) Living Arrangements: Alone   Type of Home:  [facility]) Home Access: Level entry;Elevator       Home Layout: One level   Additional Comments: pt reports using crutches while at hotel, reports difficulty with use of managing crutches in bathroom    Prior Function            PT Goals (current goals can now be found in the care plan section) Acute Rehab PT Goals Patient Stated Goal: decr pain PT Goal Formulation: Patient unable to participate in goal setting Time For Goal Achievement: 11/09/21 Potential to Achieve Goals: Good Progress towards PT goals: Progressing toward goals (EXTREMELY slowly)    Frequency    Min 5X/week      PT Plan Current plan remains appropriate;Other (comment) (Pt's plan to get an apartment is encouraging)    Co-evaluation PT/OT/SLP Co-Evaluation/Treatment: Yes Reason for Co-Treatment: Other (comment) (incr pain and woul dlikely refuse 2nd discipline) PT goals addressed during session: Mobility/safety with mobility OT goals addressed during session: ADL's and self-care;Proper use of Adaptive equipment and DME      AM-PAC PT "6 Clicks" Mobility   Outcome Measure  Help needed turning from your back to your side while in a flat bed without using bedrails?: A Little Help needed moving from lying on your back to sitting on the side of a flat bed without using bedrails?: A Little Help needed moving to and from a bed to a chair (including a wheelchair)?: A Little Help needed standing up from a chair using your arms (e.g., wheelchair or bedside chair)?: A Little Help needed to walk in hospital room?: A Lot Help needed climbing 3-5 steps with a railing? : A Lot 6 Click Score: 16    End of Session   Activity Tolerance: Patient limited by pain Patient left: in bed;with call bell/phone within reach;with bed alarm set Nurse Communication: Mobility  status;Other (comment) (pt on bedpan) PT Visit Diagnosis: Other abnormalities of gait and mobility (R26.89);Pain Pain - Right/Left: Right Pain - part of body: Leg     Time: 0737-1062 PT Time Calculation (min) (ACUTE ONLY): 42 min  Charges:  $Therapeutic Activity: 8-22 mins                     Van Clines, PT  Acute Rehabilitation Services Pager [phone] Office [phone]    Levi Aland 10/27/2021, 4:35 PM

## 2021-10-27 NOTE — Evaluation (Addendum)
Occupational Therapy Evaluation Patient Details Name: Wanda Warner MRN: [id number] DOB: Oct 19, 1980 Today's Date: 10/27/2021   History of Present Illness Pt is a 41 y/o F presenting to ED on 1/9 after fall while crossing a median, found to have R patellar fx, s/p ORIF on 10/25/2021, NWB;  has a past medical history of Anxiety, Arthritis, Depression, Headache, Loss of smell, Thoracic compression fracture (HCC), and Traumatic brain injury (07/28/2013);  currently living in a hotel with very limited social support, and is on chronic opioids, and was managing very poorly independently   Clinical Impression   At baseline, pt independent with ADLs and has been using crutches for mobility since injury. Pt lives in hotel at this time, but is possibly moving to an apartment that her father is helping set up. Pt currently very anxious, tearful and stressed regarding injury and living situation. Difficulty with redirection, bed level ADLs min A at this time, mod I for bed mobility. Pt declining transfers/OOB mobility attempts at this time due to anticipated pain despite premedication and pain meds given during session. Anticipate further mobility/ADLs can be assessed at next session. Pt presenting with impairments listed below, will continue to follow acutely. Recommend HHOT at d/c pending pt progress.      Recommendations for follow up therapy are one component of a multi-disciplinary discharge planning process, led by the attending physician.  Recommendations may be updated based on patient status, additional functional criteria and insurance authorization.   Follow Up Recommendations  Home health OT    Assistance Recommended at Discharge Intermittent Supervision/Assistance  Patient can return home with the following A lot of help with walking and/or transfers;A little help with bathing/dressing/bathroom;Assistance with cooking/housework;Assist for transportation;Help with stairs or ramp for entrance     Functional Status Assessment  Patient has had a recent decline in their functional status and demonstrates the ability to make significant improvements in function in a reasonable and predictable amount of time.  Equipment Recommendations  Other (comment);BSC/3in1 (TBD depending on d/c location)    Recommendations for Other Services PT consult     Precautions / Restrictions Precautions Precautions: Fall Restrictions Weight Bearing Restrictions: Yes RLE Weight Bearing: Non weight bearing      Mobility Bed Mobility Overal bed mobility: Needs Assistance Bed Mobility: Supine to Sit     Supine to sit: Modified independent (Device/Increase time)     General bed mobility comments: able to long sit unsupported to don shoes    Transfers                   General transfer comment: pt refuses to attempt due to anticipated pain      Balance                                           ADL either performed or assessed with clinical judgement   ADL Overall ADL's : Needs assistance/impaired Eating/Feeding: Set up;Sitting   Grooming: Set up;Sitting   Upper Body Bathing: Sitting;Supervision/ safety   Lower Body Bathing: Minimal assistance;Sitting/lateral leans   Upper Body Dressing : Set up;Sitting   Lower Body Dressing: Supervision/safety;Sitting/lateral leans Lower Body Dressing Details (indicate cue type and reason): don/doffs shoe in long sitting Toilet Transfer: Minimal assistance Toilet Transfer Details (indicate cue type and reason): able to lift hips up to place bed pan, refuses to attempt transferring to toilet/BSC Toileting- Clothing Manipulation and  Hygiene: Supervision/safety;Sitting/lateral lean Toileting - Clothing Manipulation Details (indicate cue type and reason): able to lean to R side to pull gown out from underneath self while sitting with RLE supported EOB     Functional mobility during ADLs: Min guard       Vision   Vision  Assessment?: No apparent visual deficits     Perception     Praxis      Pertinent Vitals/Pain Pain Assessment Pain Assessment: Faces Pain Score: 10-Worst pain ever Faces Pain Scale: Hurts worst Pain Location: R knee Pain Descriptors / Indicators: Crying, Grimacing, Guarding, Constant, Aching, Discomfort, Operative site guarding Pain Intervention(s): Limited activity within patient's tolerance, Monitored during session, Patient requesting pain meds-RN notified, Premedicated before session, Repositioned, RN gave pain meds during session     Hand Dominance     Extremity/Trunk Assessment Upper Extremity Assessment Upper Extremity Assessment: Overall WFL for tasks assessed   Lower Extremity Assessment Lower Extremity Assessment: Defer to PT evaluation   Cervical / Trunk Assessment Cervical / Trunk Assessment: Normal   Communication Communication Communication: No difficulties   Cognition Arousal/Alertness: Awake/alert Behavior During Therapy: Restless, Anxious, Agitated Overall Cognitive Status: No family/caregiver present to determine baseline cognitive functioning                                 General Comments: preoccupied with thought of pain with moving     General Comments  pt very anxious and emotional throughout session, stressed about living situation and uncontrolled pain, wanting to sleep.    Exercises     Shoulder Instructions      Home Living Family/patient expects to be discharged to:: [facility](Hotel, possibly an apartement her father is arranging for her) Living Arrangements: Alone   Type of Home:  [facility](Hotel) Home Access: Level entry;Elevator     Home Layout: One level                   Additional Comments: pt reports using crutches while at hotel, reports difficulty with use of managing crutches in bathroom      Prior Functioning/Environment Prior Level of Function : Independent/Modified Independent;History of Falls (last six  months)             Mobility Comments: used crutches          OT Problem List: Decreased strength;Decreased range of motion;Decreased activity tolerance;Impaired balance (sitting and/or standing);Pain;Decreased knowledge of use of DME or AE;Decreased safety awareness;Decreased knowledge of precautions      OT Treatment/Interventions: Self-care/ADL training;Therapeutic exercise;DME and/or AE instruction;Therapeutic activities;Patient/family education;Balance training    OT Goals(Current goals can be found in the care plan section) Acute Rehab OT Goals Patient Stated Goal: control pain, sleep OT Goal Formulation: With patient Time For Goal Achievement: 11/10/21 Potential to Achieve Goals: Good  OT Frequency: Min 3X/week    Co-evaluation PT/OT/SLP Co-Evaluation/Treatment: Yes Reason for Co-Treatment: Other (comment);To address functional/ADL transfers (due to increased pain and prior refusal)   OT goals addressed during session: ADL's and self-care;Proper use of Adaptive equipment and DME      AM-PAC OT "6 Clicks" Daily Activity     Outcome Measure Help from another person eating meals?: None Help from another person taking care of personal grooming?: None Help from another person toileting, which includes using toliet, bedpan, or urinal?: A Lot Help from another person bathing (including washing, rinsing, drying)?: A Little Help from another person to put on and  taking off regular upper body clothing?: None Help from another person to put on and taking off regular lower body clothing?: A Lot 6 Click Score: 19   End of Session Equipment Utilized During Treatment: Rolling walker (2 wheels) Nurse Communication: Mobility status;Patient requests pain meds  Activity Tolerance: Patient limited by pain Patient left: in bed;with bed alarm set;with call bell/phone within reach  OT Visit Diagnosis: Unsteadiness on feet (R26.81);Other abnormalities of gait and mobility  (R26.89);Muscle weakness (generalized) (M62.81);History of falling (Z91.81);Pain Pain - Right/Left: Right Pain - part of body: Knee;Leg                Time: 7494-4967 OT Time Calculation (min): 42 min Charges:  OT General Charges $OT Visit: 1 Visit OT Evaluation $OT Eval Low Complexity: 1 Low  Alfonzo Beers, OTD, OTR/L Acute Rehab [phone]   Mayer Masker 10/27/2021, 3:08 PM

## 2021-10-27 NOTE — Progress Notes (Signed)
OT Cancellation Note  Patient Details Name: Wanda Warner MRN: [id number] DOB: 1981-09-20   Cancelled Treatment:    Reason Eval/Treat Not Completed: Pain limiting ability to participate (Pt reporting her pain is not being managed at this time and is unable to attempt mobility. Will follow up with evaluation as schedule allows.)  Alfonzo Beers, OTD, OTR/L Acute Rehab [phone]   Mayer Masker 10/27/2021, 8:37 AM

## 2021-10-27 NOTE — Progress Notes (Signed)
Mobility Specialist Progress Note   10/27/21 1220  Mobility  Activity Refused mobility   Pt stating to be in too much pain and wanting to rest d/t lack of sleep. PT notify for upcoming session. Will f/u again tomorrow.  Holland Falling Mobility Specialist Phone Number [phone]

## 2021-10-27 NOTE — Progress Notes (Signed)
° ° ° °  Subjective: 2 Days Post-Op s/p Procedure(s): OPEN REDUCTION INTERNAL (ORIF) FIXATION PATELLA  States pain is severe, not well controlled, every time she wakes up it comes back. Frustrated with continue pain. No other complaints.   Objective:  PE: VITALS:   Vitals:   10/26/21 1149 10/26/21 1913 10/27/21 0442 10/27/21 0810  BP: 111/66 106/62 103/61 111/63  Pulse: 73 68 83 75  Resp: 16 18 20 18   Temp: 98.8 F (37.1 C) 98.7 F (37.1 C) 97.8 F (36.6 C) 98.5 F (36.9 C)  TempSrc: Oral Oral Oral Oral  SpO2: 99% 99% 99% 100%  Weight:      Height:       General: laying in bed, in no acute distress, but tearful Resp:  normal respiratory effort GI: soft, nontender MSK: RLE in long leg splint. Dorsiflexion and plantarflexion intact. Sensation intact to all aspects of foot. 2+ DP Pulse. No significant foot edema.   LABS  No results found for this or any previous visit (from the past 24 hour(s)).  DG MINI C-ARM IMAGE ONLY  Result Date: 10/25/2021 There is no interpretation for this exam.  This order is for images obtained during a surgical procedure.  Please See "Surgeries" Tab for more information regarding the procedure.    Assessment/Plan: 41 yo patient with displaced right patella fracture with history of TBI and opioid dependency    1 Day Post-Op s/p Procedure(s): OPEN REDUCTION INTERNAL (ORIF) FIXATION PATELLA   Weightbearing: NWB RLE Insicional and dressing care: Dressings left intact until follow-up Orthopedic device(s): Long leg splint VTE prophylaxis: Lovenox 40 mg q 24 hours Pain control: Pain control continues to be difficult as expected. She takes percocet 10/325 q 6 hours at baseline with clonazepam BID. Increased prn oxycodone yesterday.  Will change regiment to include BID baseline oxycontin with her baseline q 6 hours oxycodone 10's with dilaudid prn.   Discussed with Dr. Hilma Favors of palliative medicine yesterday to see if they would do an acute pain  consult, but this is not part of their practice for patients who do not have long term conditions such as cancer, etc. I have also reached out to Dr. Royce Macadamia, her pain management physician, for his recommendations as well.  Follow - up plan: 2 weeks with Dr. Mardelle Matte Dispo: Pending better pain control and ability to work with PT. She really has no safe discharge plan, she is living alone in a hotel due to a recent separation.    Contact information:   Weekdays 8-5 Merlene Pulling, [place] [phone] A fter hours and holidays please check [email or web address] for group call information for [facility]  Ventura Bruns 10/27/2021, 9:55 AM

## 2021-10-28 MED ORDER — OXYCODONE HCL 5 MG PO TABS
10.0000 mg | ORAL_TABLET | ORAL | Status: DC
Start: 2021-10-28 — End: 2021-10-30
  Administered 2021-10-28 – 2021-10-30 (×12): 10 mg via ORAL
  Filled 2021-10-28 (×12): qty 2

## 2021-10-28 NOTE — Progress Notes (Signed)
° ° ° °  Subjective: 3 Days Post-Op s/p Procedure(s): OPEN REDUCTION INTERNAL (ORIF) FIXATION PATELLA  Patient feeling better today. Had just received IV dilaudid. States father will be helping her upon discharge, and she may have new apartment at that time. Frustrated due to other life factors, but not tearful on exam today due to pain. She is determined to work with PT and OT this afternoon.    Objective:  PE: VITALS:   Vitals:   10/27/21 0810 10/27/21 1448 10/27/21 2041 10/28/21 0757  BP: 111/63 102/63 96/63 100/67  Pulse: 75 80 79 80  Resp: 18  16 16   Temp: 98.5 F (36.9 C) 98 F (36.7 C) 98.5 F (36.9 C) 98.6 F (37 C)  TempSrc: Oral Oral Oral Oral  SpO2: 100% 98% 100% 99%  Weight:      Height:       General: laying in bed, in no acute distress Resp:  normal respiratory effort GI: soft, nontender MSK: RLE in long leg splint. Dorsiflexion and plantarflexion intact. Sensation intact to all aspects of foot. 2+ DP Pulse. No significant foot edema.   LABS  No results found for this or any previous visit (from the past 24 hour(s)).  No results found.  Assessment/Plan: 41 yo patient with displaced right patella fracture with history of TBI and opioid dependency    3 Day Post-Op s/p Procedure(s): OPEN REDUCTION INTERNAL (ORIF) FIXATION PATELLA   Weightbearing: NWB RLE Insicional and dressing care: Dressings left intact until follow-up Orthopedic device(s): Long leg splint VTE prophylaxis: Lovenox 40 mg q 24 hours Pain control: Pain control continues to be difficult as expected.  Patient takes percocet 10/325 q 4 hours according to 03-14-1982. Will plan to increase baseline oxycodone to q 4 hours and d/c oxycontin with continue need for IV dilaudid. We will start po dialaudid tomorrow.   Follow - up plan: 2 weeks with Dr. [employer] Dispo plan: Discussed dispo plan with Avina today and she is in agreement. We will continue IV dilaudid use today for better pain control,  hopefully able to work with PT/OT today. We will plan to wean her to po meds only tomorrow with plan to discharge on Friday. Patient in agreement, she feels she can safely discharge home with family on Friday.    Contact information:   Weekdays 8-5 06-14-2006, Janine Ores [place] A fter hours and holidays please check [email or web address] for group call information for [facility]  [phone] 10/28/2021, 9:12 AM

## 2021-10-28 NOTE — Plan of Care (Signed)
  Problem: Education: Goal: Knowledge of General Education information will improve Description: Including pain rating scale, medication(s)/side effects and non-pharmacologic comfort measures Outcome: Progressing   Problem: Health Behavior/Discharge Planning: Goal: Ability to manage health-related needs will improve Outcome: Progressing   Problem: Clinical Measurements: Goal: Will remain free from infection Outcome: Progressing   Problem: Activity: Goal: Risk for activity intolerance will decrease Outcome: Progressing   Problem: Coping: Goal: Level of anxiety will decrease Outcome: Progressing   Problem: Pain Managment: Goal: General experience of comfort will improve Outcome: Progressing   

## 2021-10-28 NOTE — Progress Notes (Signed)
Occupational Therapy Treatment Patient Details Name: Wanda Warner MRN: [id number] DOB: [birth year] Today's Date: 10/28/2021   History of present illness Pt is a 41 y/o F presenting to ED on 1/9 after fall while crossing a median, found to have R patellar fx, s/p ORIF on 10/25/2021, NWB;  has a past medical history of Anxiety, Arthritis, Depression, Headache, Loss of smell, Thoracic compression fracture (HCC), and Traumatic brain injury (07/28/2013);  currently living in a hotel with very limited social support, and is on chronic opioids, and was managing very poorly independently   OT comments  Pt progressing well towards goals this session, able to complete ADLs with supervision-min A, min A for bed mobility requiring increased support of RLE when sitting EOB. Pt completed transfer x2 to [facility] and chair with min A. Adheres well to precautions throughout session. Increased time and encouragement needed throughout session for mobility progression. Pt presenting with impairments listed below, will follow acutely. Continue to recommend HHOT at d/c.   Recommendations for follow up therapy are one component of a multi-disciplinary discharge planning process, led by the attending physician.  Recommendations may be updated based on patient status, additional functional criteria and insurance authorization.    Follow Up Recommendations  Home health OT    Assistance Recommended at Discharge Intermittent Supervision/Assistance  Patient can return home with the following  A lot of help with walking and/or transfers;A little help with bathing/dressing/bathroom;Assistance with cooking/housework;Assist for transportation;Help with stairs or ramp for entrance   Equipment Recommendations  Other (comment) (TBD depending on d/c location)    Recommendations for Other Services PT consult    Precautions / Restrictions Precautions Precautions: Fall Precaution Comments: Fall risk is present, but low Required Braces  or Orthoses: Other Brace;Splint/Cast (RLE) Restrictions Weight Bearing Restrictions: Yes RLE Weight Bearing: Non weight bearing       Mobility Bed Mobility Overal bed mobility: Needs Assistance Bed Mobility: Supine to Sit     Supine to sit: Min assist     General bed mobility comments: provided support to RLE in standing    Transfers Overall transfer level: Needs assistance Equipment used: Rolling walker (2 wheels) Transfers: Sit to/from Stand, Bed to chair/wheelchair/BSC Sit to Stand: Min assist     Step pivot transfers: Min assist           Balance Overall balance assessment: Needs assistance Sitting-balance support: Feet supported, Bilateral upper extremity supported Sitting balance-Leahy Scale: Normal     Standing balance support: During functional activity, Reliant on assistive device for balance Standing balance-Leahy Scale: Poor                             ADL either performed or assessed with clinical judgement   ADL Overall ADL's : Needs assistance/impaired                     Lower Body Dressing: Supervision/safety;Sitting/lateral leans Lower Body Dressing Details (indicate cue type and reason): don/doffs shoe in long sitting Toilet Transfer: Min guard;Rolling walker (2 wheels);BSC/3in1;Stand-pivot Toilet Transfer Details (indicate cue type and reason): able to adher to NWB status Toileting- Clothing Manipulation and Hygiene: Modified independent;Sitting/lateral lean Toileting - Clothing Manipulation Details (indicate cue type and reason): completed pericare and clothing mgmt     Functional mobility during ADLs: Min guard      Extremity/Trunk Assessment Upper Extremity Assessment Upper Extremity Assessment: Overall WFL for tasks assessed   Lower Extremity Assessment Lower Extremity Assessment:  Defer to PT evaluation        Vision   Vision Assessment?: No apparent visual deficits   Perception     Praxis       Cognition Arousal/Alertness: Awake/alert Behavior During Therapy: Anxious, WFL for tasks assessed/performed, Agitated Overall Cognitive Status: No family/caregiver present to determine baseline cognitive functioning                                 General Comments: preoccupied with thought of pain with moving        Exercises      Shoulder Instructions       General Comments increased time needed due to pt anxiety around standing and even sitting EOB. session timed with oral and IV pain meds as well as anxiety medication    Pertinent Vitals/ Pain       Pain Assessment Pain Assessment: Faces Pain Score: 6  Pain Location: R knee Pain Descriptors / Indicators: Grimacing, Guarding Pain Intervention(s): Limited activity within patient's tolerance, Monitored during session  Home Living                                          Prior Functioning/Environment              Frequency  Min 3X/week        Progress Toward Goals  OT Goals(current goals can now be found in the care plan section)  Progress towards OT goals: Progressing toward goals  Acute Rehab OT Goals Patient Stated Goal: none stated OT Goal Formulation: With patient Time For Goal Achievement: 11/10/21 Potential to Achieve Goals: Good  Plan Discharge plan remains appropriate;Frequency remains appropriate    Co-evaluation    PT/OT/SLP Co-Evaluation/Treatment: Yes Reason for Co-Treatment: Other (comment) (Collaborative PT/OT decision as pt has had limited participation the past 2 days)   OT goals addressed during session: ADL's and self-care      AM-PAC OT "6 Clicks" Daily Activity     Outcome Measure   Help from another person eating meals?: None Help from another person taking care of personal grooming?: None Help from another person toileting, which includes using toliet, bedpan, or urinal?: A Little Help from another person bathing (including washing,  rinsing, drying)?: A Little Help from another person to put on and taking off regular upper body clothing?: None Help from another person to put on and taking off regular lower body clothing?: A Little 6 Click Score: 21    End of Session Equipment Utilized During Treatment: Rolling walker (2 wheels)  OT Visit Diagnosis: Unsteadiness on feet (R26.81);Other abnormalities of gait and mobility (R26.89);Muscle weakness (generalized) (M62.81);History of falling (Z91.81);Pain Pain - Right/Left: Right Pain - part of body: Knee;Leg   Activity Tolerance Patient limited by pain   Patient Left in chair;with chair alarm set;with call bell/phone within reach   Nurse Communication Mobility status        Time: 2423-5361 OT Time Calculation (min): 42 min  Charges: OT General Charges $OT Visit: 1 Visit OT Treatments $Self Care/Home Management : 8-22 mins  Alfonzo Beers, OTD, OTR/L Acute Rehab [phone]   Mayer Masker 10/28/2021, 5:37 PM

## 2021-10-28 NOTE — Progress Notes (Signed)
Physical Therapy Treatment Patient Details Name: Wanda Warner MRN: [id number] DOB: November 07, 1980 Today's Date: 10/28/2021   History of Present Illness Pt is a 41 y/o F presenting to ED on 1/9 after fall while crossing a median, found to have R patellar fx, s/p ORIF on 10/25/2021, NWB;  has a past medical history of Anxiety, Arthritis, Depression, Headache, Loss of smell, Thoracic compression fracture ([place]), and Traumatic brain injury (07/28/2013);  currently living in a hotel with very limited social support, and is on chronic opioids, and was managing very poorly independently    PT Comments    Continuing work on functional mobility and activity tolerance;  Coordinated today's session with pain meds, ritalin, and IV meds per pt request; Provided pt with gentle presence and encouragement, and we were successful with sit to stand and step pivot transfers, and performed toileting ADL (see OT note for other details); overall min assist for RW steadiness and RLE support with transitions; Plans to leave the hospital Friday are motivating for pt; consider adding stair goal, as we currently do not know how many stairs there are to enter her potential apartment  Recommendations for follow up therapy are one component of a multi-disciplinary discharge planning process, led by the attending physician.  Recommendations may be updated based on patient status, additional functional criteria and insurance authorization.  Follow Up Recommendations  Home health PT     Assistance Recommended at Discharge Intermittent Supervision/Assistance  Patient can return home with the following Assist for transportation   Equipment Recommendations  Rolling walker (2 wheels);BSC/3in1    Recommendations for Other Services OT consult     Precautions / Restrictions Precautions Precautions: Fall Precaution Comments: Fall risk is present, but low Required Braces or Orthoses: Splint/Cast (Long leg cast RLE) Restrictions RLE  Weight Bearing: Non weight bearing     Mobility  Bed Mobility Overal bed mobility: Needs Assistance Bed Mobility: Supine to Sit     Supine to sit: Min assist     General bed mobility comments: Min assist to support RLE coming off of bed; cues to scoot hips to EOB so that she can rest R foot on or near floor (in long leg splint)    Transfers Overall transfer level: Needs assistance Equipment used: Rolling walker (2 wheels) Transfers: Sit to/from Stand, Bed to chair/wheelchair/BSC Sit to Stand: Min assist   Step pivot transfers: Min assist       General transfer comment: Min assist to steady RW and help support/manage RLE during transitions to stans and to sit; Stood from bed, tehn performed step pivot transition to [facility]; after completing ADL, performed sit to stand and step pivot transfer BSC to recliner    Ambulation/Gait               General Gait Details: Performed pivot steps as above in transfer section; maintained NWB RLE well; now that she knows how it feels to take steps, hopeful for good progress with gait   Stairs             Wheelchair Mobility    Modified Rankin (Stroke Patients Only)       Balance     Sitting balance-Leahy Scale: Normal       Standing balance-Leahy Scale: Poor (approaching Fair) Standing balance comment: Using RW for support                            Cognition Arousal/Alertness: Awake/alert Behavior During Therapy:  Anxious, WFL for tasks assessed/performed (less restless than previous sessions) Overall Cognitive Status: No family/caregiver present to determine baseline cognitive functioning                                 General Comments: preoccupied with thought of pain with moving        Exercises      General Comments General comments (skin integrity, edema, etc.): Incr time and gentle encouragement provided throughout session; Opted to try and limit talking and overt  reasoning/persuasion and lean into moments of quiet as Cat prepares herself to move; used a few reminders about the plan to leave teh hospital on Friday      Pertinent Vitals/Pain Pain Assessment Pain Assessment: Faces Faces Pain Scale: Hurts even more Pain Location: R knee Pain Descriptors / Indicators: Grimacing, Guarding (Occasional expletive) Pain Intervention(s): Monitored during session, Premedicated before session, Patient requesting pain meds-RN notified, RN gave pain meds during session    Home Living                          Prior Function            PT Goals (current goals can now be found in the care plan section) Acute Rehab PT Goals Patient Stated Goal: Pt verbalized teh goal to be able to dc from hospital to new apartment on Friday PT Goal Formulation: With patient Time For Goal Achievement: 11/09/21 Potential to Achieve Goals: Good Progress towards PT goals: Progressing toward goals    Frequency    Min 5X/week      PT Plan Current plan remains appropriate    Co-evaluation PT/OT/SLP Co-Evaluation/Treatment: Yes Reason for Co-Treatment: To address functional/ADL transfers;Necessary to address cognition/behavior during functional activity;Other (comment) (Pt-centerd decision to see her together, given limited ability to participate over past 2 days) PT goals addressed during session: Mobility/safety with mobility        AM-PAC PT "6 Clicks" Mobility   Outcome Measure  Help needed turning from your back to your side while in a flat bed without using bedrails?: None Help needed moving from lying on your back to sitting on the side of a flat bed without using bedrails?: A Little Help needed moving to and from a bed to a chair (including a wheelchair)?: A Little Help needed standing up from a chair using your arms (e.g., wheelchair or bedside chair)?: A Little Help needed to walk in hospital room?: A Little Help needed climbing 3-5 steps with a  railing? : A Lot 6 Click Score: 18    End of Session   Activity Tolerance: Patient tolerated treatment well Patient left: in chair;with call bell/phone within reach;with chair alarm set Nurse Communication: Mobility status PT Visit Diagnosis: Other abnormalities of gait and mobility (R26.89);Pain Pain - Right/Left: Right Pain - part of body: Leg     Time: 1132-1222 PT Time Calculation (min) (ACUTE ONLY): 50 min  Charges:  $Therapeutic Activity: 23-37 mins                     Roney Marion, PT  Acute Rehabilitation Services Pager [phone] Office [phone]    Colletta Maryland 10/28/2021, 1:44 PM

## 2021-10-28 NOTE — Progress Notes (Signed)
Mobility Specialist Progress Note   10/28/21 1815  Mobility  Activity Ambulated independently in room  Level of Assistance Standby assist, set-up cues, supervision of patient - no hands on  Assistive Device Front wheel walker  RLE Weight Bearing NWB  Distance Ambulated (ft) 16 ft (8+8)  Activity Response Tolerated well  $Mobility charge 1 Mobility   Coordinated w/ RN to have meds before session, pt describing to be feeling better but reporting pain to be 7.5/10. Min A for physical assistance of RLE to EOB. Upon sitting EOB pt refusing gait belt and requesting to do mobility w/ as little assistance as possible. Pt still requiring mod cues for hand placement when ascending and descending from EOB and chair. X1 seated break d/t slight fatiguebtu no inc in pain. Returned BTB w/ all needs met, call bell in reach and pt requesting exercises to do in bed when laying down.   Holland Falling Mobility Specialist Phone Number [phone]

## 2021-10-29 MED ORDER — HYDROMORPHONE HCL 2 MG PO TABS
2.0000 mg | ORAL_TABLET | ORAL | Status: DC | PRN
  Administered 2021-10-29 – 2021-10-30 (×4): 2 mg via ORAL
  Filled 2021-10-29 (×4): qty 1

## 2021-10-29 MED ORDER — ACETAMINOPHEN 325 MG PO TABS
650.0000 mg | ORAL_TABLET | ORAL | Status: DC
  Administered 2021-10-29 – 2021-10-30 (×7): 650 mg via ORAL
  Filled 2021-10-29 (×7): qty 2

## 2021-10-29 MED ORDER — HYDROMORPHONE HCL 1 MG/ML IJ SOLN
0.5000 mg | INTRAMUSCULAR | Status: DC | PRN
  Administered 2021-10-29: 1 mg via INTRAVENOUS
  Filled 2021-10-29: qty 1

## 2021-10-29 NOTE — Progress Notes (Addendum)
° ° ° °  Subjective: 4 Days Post-Op s/p Procedure(s): OPEN REDUCTION INTERNAL (ORIF) FIXATION PATELLA   Patient is alert, oriented. Reports pain as mod-severe. Happy she did well working with PT and OT yesterday. Anxious regarding discharge. Denies chest pain, SOB, Calf pain. No nausea/vomiting. No other complaints.    Objective:  PE: VITALS:   Vitals:   10/27/21 1448 10/27/21 2041 10/28/21 0757 10/28/21 2108  BP: 102/63 96/63 100/67 111/65  Pulse: 80 79 80 84  Resp:  16 16 17   Temp: 98 F (36.7 C) 98.5 F (36.9 C) 98.6 F (37 C) 98.6 F (37 C)  TempSrc: Oral Oral Oral Oral  SpO2: 98% 100% 99% 98%  Weight:      Height:       General: laying in bed, in no acute distress Resp:  normal respiratory effort GI: soft, nontender MSK: RLE in long leg splint. Dorsiflexion and plantarflexion intact. Sensation intact to all aspects of foot. 2+ DP Pulse. No significant foot edema.  Noting left wrist pain at ulnar styloid. Full motion. Moderate TTP to ulnar styloid. All fingers flex, extend, and abduct. Distal sensation intact. Hand warm and well perfused.   LABS  No results found for this or any previous visit (from the past 24 hour(s)).  No results found.  Assessment/Plan: 41 yo patient with displaced right patella fracture with history of TBI and opioid dependency     4 Days Post-Op s/p Procedure(s): OPEN REDUCTION INTERNAL (ORIF) FIXATION PATELLA  Left wrist pain: Patient states left wrist has been hurting since fall, feels like she may have a hairline fracture. I will order an x-ray of her wrist. Patient has requested it be afternoon so that she can sleep this morning. Will put in order midday today. In the meantime will order wrist brace for comfort.   Weightbearing: NWB RLE Insicional and dressing care: Dressings left intact until follow-up Orthopedic device(s): Long leg splint VTE prophylaxis: Lovenox 40 mg q 24 hours Pain control: Pain control continues to be difficult  as expected. Continue oxycodone 10 q 4 hours. Last dose of IV dilaudid will be this morning at approx 7. We will then change to po dilaudid prn for breakthrough pain. Spoke extensively with patient about this plan.  Dispo plan: Discussed dispo plan again with Catlyn today and she is in agreement. Better mobility was gained with PT/OT yesterday and she has safe discharge plan to discharge home with dad tomorrow.   Follow - up plan: 2 weeks with Dr. Lumpkin Sink information:   Erskine Emery 8-5 [id number], PA-C [phone] A fter hours and holidays please check [email or web address] for group call information for [facility]  [phone] 10/29/2021, 7:07 AM

## 2021-10-29 NOTE — Progress Notes (Signed)
PT Cancellation Note  Patient Details Name: Wanda Warner MRN: [id number] DOB: [birth year]   Cancelled Treatment:    Reason Eval/Treat Not Completed: Pain limiting ability to participate.  Refusing over pain and very upset about some life issues now, will retry at another time.  Nursing aware and has been giving all oral meds as ordered.   Ivar Drape 10/29/2021, 5:36 PM Samul Dada, PT PhD Acute Rehab Dept. Number: [facility] [id number] and [facility] [phone]

## 2021-10-29 NOTE — Care Management Important Message (Signed)
Important Message  Patient Details  Name: Wanda Warner MRN: [id number] Date of Birth: [birth year]   Medicare Important Message Given:  Yes     Sherilyn Banker 10/29/2021, 12:18 PM

## 2021-10-29 NOTE — Progress Notes (Signed)
OT Cancellation Note  Patient Details Name: Wanda Warner MRN: [id number] DOB: November 05, 1980   Cancelled Treatment:    Reason Eval/Treat Not Completed: Patient at procedure or test/ unavailable.  Pt having IV placed.  Will reattempt.  Eber Jones., OTR/L Acute Rehabilitation Services Pager [phone] Office [phone]   Jeani Hawking M 10/29/2021, 4:57 PM

## 2021-10-29 NOTE — Plan of Care (Signed)

## 2021-10-30 ENCOUNTER — Other Ambulatory Visit (HOSPITAL_COMMUNITY): Payer: Self-pay

## 2021-10-30 ENCOUNTER — Inpatient Hospital Stay (HOSPITAL_COMMUNITY): Payer: Medicare HMO

## 2021-10-30 MED ORDER — HYDROMORPHONE HCL 2 MG PO TABS
2.0000 mg | ORAL_TABLET | ORAL | 0 refills | Status: AC | PRN
  Filled 2021-10-30: qty 30, 5d supply, fill #0

## 2021-10-30 MED ORDER — HYDROMORPHONE HCL 2 MG PO TABS
2.0000 mg | ORAL_TABLET | ORAL | 0 refills | Status: DC | PRN
Start: 2021-10-30 — End: 2021-10-30

## 2021-10-30 MED ORDER — ENOXAPARIN SODIUM 40 MG/0.4ML IJ SOSY
40.0000 mg | PREFILLED_SYRINGE | INTRAMUSCULAR | 0 refills | Status: AC
  Filled 2021-10-30: qty 12, 30d supply, fill #0

## 2021-10-30 MED ORDER — OXYCODONE-ACETAMINOPHEN 10-325 MG PO TABS
1.0000 | ORAL_TABLET | ORAL | 0 refills | Status: AC | PRN
  Filled 2021-10-30: qty 24, 4d supply, fill #0

## 2021-10-30 MED ORDER — ENOXAPARIN SODIUM 40 MG/0.4ML IJ SOSY
40.0000 mg | PREFILLED_SYRINGE | INTRAMUSCULAR | 0 refills | Status: DC
Start: 2021-10-30 — End: 2021-10-30

## 2021-10-30 MED ORDER — METHOCARBAMOL 500 MG PO TABS: 500.0000 mg | ORAL_TABLET | Freq: Three times a day (TID) | ORAL | 0 refills | Status: DC | PRN

## 2021-10-30 MED ORDER — METHOCARBAMOL 500 MG PO TABS
500.0000 mg | ORAL_TABLET | Freq: Three times a day (TID) | ORAL | 0 refills | Status: AC | PRN
  Filled 2021-10-30: qty 20, 7d supply, fill #0

## 2021-10-30 MED ORDER — OXYCODONE-ACETAMINOPHEN 10-325 MG PO TABS: 1.0000 | ORAL_TABLET | ORAL | 0 refills | Status: DC | PRN

## 2021-10-30 NOTE — Progress Notes (Signed)
Mobility Specialist Progress Note   10/30/21 1035  Mobility  Activity Ambulated with assistance to bathroom  Level of Assistance Minimal assist, patient does 75% or more  Assistive Device Front wheel walker  RLE Weight Bearing NWB  Activity Response Tolerated well  $Mobility charge 1 Mobility   Received pt in bed c/o pain(8.5/10) but comfortably conversing w/ me and agreeable. Min A  for RLE to EOB but stand by for ambulation to BR. Min cues for hand placement when ascending and descending from seated areas. Returned back to bed asking about next sch pain meds but having no current complaint, call bell in reach.   Frederico Hamman Mobility Specialist Phone Number [phone]

## 2021-10-30 NOTE — Discharge Summary (Signed)
Discharge Summary  Patient ID: Wanda Warner MRN: [id number] DOB/AGE: Jun 02, 1981 41 y.o.  Admit date: 10/25/2021 Discharge date: 10/30/2021  Admission Diagnoses:  Right patella fracture  Discharge Diagnoses:  Right patella fracture  Past Medical History:  Diagnosis Date   Anxiety    Arthritis    Knees and back    Depression    Headache    Loss of smell    Thoracic compression fracture (HCC)    Traumatic brain injury 07/28/2013    Surgeries: Procedure(s): OPEN REDUCTION INTERNAL (ORIF) FIXATION PATELLA on 10/25/2021   Consultants (if any):   Discharged Condition: Improved  Hospital Course: Wanda Warner is an 41 y.o. female who was admitted 10/25/2021 with a diagnosis of Right patella fracture and went to the operating room on 10/25/2021 and underwent the above named procedures.    She was given perioperative antibiotics:  Anti-infectives (From admission, onward)    Start     Dose/Rate Route Frequency Ordered Stop   10/25/21 1400  ceFAZolin (ANCEF) IVPB 2g/100 mL premix        2 g 200 mL/hr over 30 Minutes Intravenous Every 6 hours 10/25/21 1136 10/26/21 0104   10/25/21 0615  ceFAZolin (ANCEF) IVPB 2g/100 mL premix        2 g 200 mL/hr over 30 Minutes Intravenous On call to O.R. 10/25/21 1194 10/25/21 0811   10/25/21 0612  ceFAZolin (ANCEF) 2-4 GM/100ML-% IVPB       Note to Pharmacy: Kathrene Bongo D: cabinet override      10/25/21 0612 10/25/21 0813     .  She was given sequential compression devices, early ambulation, and lovenox for DVT prophylaxis. Post-operative pain controlled as well as mobility with PT was difficult due to previous TBI, opioid dependency at baseline, and difficult home situation of recent separation from her husband and currently living in a hotel. Patient was kept inpatient as due to need for IV pain medication. This was able to be weaned to po only pain medication and safe discharge plan was created where she can discharge home in the care of her  father.    Recent vital signs:  Vitals:   10/30/21 0734 10/30/21 1230  BP: 107/60 114/60  Pulse: 72 75  Resp: 18 18  Temp: 98.5 F (36.9 C) 98.8 F (37.1 C)  SpO2: 100% 100%    Recent laboratory studies:  Lab Results  Component Value Date   HGB 12.5 10/25/2021   HGB 13.3 08/02/2014   Lab Results  Component Value Date   WBC 13.2 (H) 10/25/2021   PLT 335 10/25/2021   No results found for: INR Lab Results  Component Value Date   NA 141 10/25/2021   K 3.4 (L) 10/25/2021   CL 107 10/25/2021   CO2 19 (L) 10/25/2021   BUN 12 10/25/2021   CREATININE 0.70 10/25/2021   GLUCOSE 97 10/25/2021    Discharge Medications:   Allergies as of 10/30/2021       Reactions   Cymbalta [duloxetine Hcl]    Unknown reaction         Medication List     STOP taking these medications    aspirin-acetaminophen-caffeine 250-250-65 MG tablet Commonly known as: EXCEDRIN MIGRAINE       TAKE these medications    clonazePAM 0.5 MG tablet Commonly known as: KLONOPIN TAKE 1 TABLET BY MOUTH TWICE DAILY   enoxaparin 40 MG/0.4ML injection Commonly known as: LOVENOX Inject 0.4 mLs (40 mg total) into the skin daily for 30  doses. For 30 days post op for DVT prophylaxis   HYDROmorphone 2 MG tablet Commonly known as: Dilaudid Take 1 tablet (2 mg total) by mouth every 4 (four) hours as needed for up to 7 days for severe pain.   ibuprofen 200 MG tablet Commonly known as: ADVIL Take 400 mg by mouth every 6 (six) hours as needed for moderate pain.   methocarbamol 500 MG tablet Commonly known as: Robaxin Take 1 tablet (500 mg total) by mouth every 8 (eight) hours as needed for muscle spasms.   methylphenidate 10 MG tablet Commonly known as: RITALIN Take 1 1/2 tabs 3x daily. What changed:  how much to take how to take this when to take this additional instructions   multivitamin with minerals Tabs tablet Take 1 tablet by mouth daily.   oxyCODONE-acetaminophen 10-325 MG  tablet Commonly known as: Percocet Take 1 tablet by mouth every 4 (four) hours as needed for up to 4 days for pain. What changed:  how much to take when to take this   topiramate 50 MG tablet Commonly known as: TOPAMAX TAKE 1 TABLET BY MOUTH EACH NIGHT AT BEDTIME   VITAMIN D PO Take 1 capsule by mouth daily.               Durable Medical Equipment  (From admission, onward)           Start     Ordered   10/30/21 0806  For home use only DME Walker rolling  Once       Question Answer Comment  Walker: With 5 Inch Wheels   Patient needs a walker to treat with the following condition Right patella fracture      10/30/21 0805   10/29/21 1349  For home use only DME 3 n 1  Once        10/29/21 1348   10/29/21 1349  For home use only DME Shower stool  Once        10/29/21 1348            Diagnostic Studies: DG Wrist 2 Views Left  Result Date: 10/30/2021 CLINICAL DATA:  Larey SeatFell onto outstretched wrist.  Ulnar-sided pain. EXAM: LEFT WRIST - 2 VIEW COMPARISON:  None. FINDINGS: 3 mm ulnar positive variance. Mild curvilinear lucency at the base of the ulnar styloid, likely a nondisplaced fracture. Mild to moderate thumb carpometacarpal and mild triscaphe joint space narrowing and peripheral osteophytosis degenerative change. No dislocation. IMPRESSION: Nondisplaced fracture of the base of the ulnar styloid. Electronically Signed   By: Neita Garnetonald  Viola M.D.   On: 10/30/2021 09:55   DG Knee 2 Views Right  Result Date: 10/19/2021 CLINICAL DATA:  Fall EXAM: RIGHT KNEE - 1-2 VIEW COMPARISON:  None. FINDINGS: Acute fracture of the patella with approximately 1.4 cm of distraction. Mild comminution. Surrounding soft tissue swelling. Joint effusion is present with possible small fluid level. IMPRESSION: Acute mildly comminuted fracture of the patella with distraction. Joint effusion is present with possible small fluid level. Electronically Signed   By: Guadlupe SpanishPraneil  Patel M.D.   On: 10/19/2021  17:07   DG MINI C-ARM IMAGE ONLY  Result Date: 10/25/2021 There is no interpretation for this exam.  This order is for images obtained during a surgical procedure.  Please See "Surgeries" Tab for more information regarding the procedure.    Disposition:      Follow-up Information     Teryl LucyLandau, Joshua, MD. Schedule an appointment as soon as possible for a visit in  1 week(s).   Specialty: Orthopedic Surgery Contact information: [address] [place] [address] [phone]                  Signed: Annita Brod 10/30/2021, 1:40 PM

## 2021-10-30 NOTE — TOC Transition Note (Signed)
Transition of Care [facility]) - CM/SW Discharge Note   Patient Details  Name: Wanda Warner MRN: [id number] Date of Birth: 03/06/1981  Transition of Care [facility]) CM/SW Contact:  Epifanio Lesches, RN Phone Number: 10/30/2021, 2:20 PM   Clinical Narrative:    Patient will DC AS:TMHD Anticipated DC date: 10/30/2021 Family notified: yes, dad Transport by: car  Admitted with R patella fracture. States PTA recently separated from spouse. Currently living in a hotel, [address] [place]. Orders noted for home health PT and DME ( RW, shower bench and BSC. Pt agreeable to home health services. Pt without preference. Referral made with [facility] and accepted. Referral made with [employer] for DME. Equipment will be delivered to bedside prior to d/c. Pt without Rx MED concerns. Post hospital follow noted on AVS. Father to assist with care once d/c and provide transportation to hotel.  Per MD patient ready for DC today . RN, patient, patient's family, and facility notified of DC.   RNCM will sign off for now as intervention is no longer needed. Please consult [place] again if new needs arise.    Final next level of care: Home w Home Health Services Barriers to Discharge: No Barriers Identified   Patient Goals and CMS Choice     Choice offered to / list presented to : Patient  Discharge Placement                       Discharge Plan and Services                DME Arranged: 3-N-1, Walker rolling, Shower stool DME Agency: [facility] Date DME Agency Contacted: 10/30/21 Time DME Agency Contacted: 1419 Representative spoke with at DME Agency: Velna Hatchet HH Arranged: PT HH Agency: [facility] Date [facility] Agency Contacted: 10/30/21 Time HH Agency Contacted: 1419 Representative spoke with at [facility] Agency: Kandee Keen  Social Determinants of Health (SDOH) Interventions     Readmission Risk Interventions No flowsheet data found.

## 2021-10-30 NOTE — Discharge Instructions (Signed)
Diet: As you were doing prior to hospitalization   Shower:  May shower but keep the wounds dry, use an occlusive plastic wrap, NO SOAKING IN TUB.  If the bandage gets wet, change with a clean dry gauze.  If you have a splint on, leave the splint in place and keep the splint dry with a plastic bag.  Dressing:  You may change your dressing 3-5 days after surgery, unless you have a splint.  If you have a splint, then just leave the splint in place and we will change your bandages during your first follow-up appointment.    If you had hand or foot surgery, we will plan to remove your stitches in about 2 weeks in the office.  For all other surgeries, there are sticky tapes (steri-strips) on your wounds and all the stitches are absorbable.  Leave the steri-strips in place when changing your dressings, they will peel off with time, usually 2-3 weeks.  Activity:  Increase activity slowly as tolerated, but follow the weight bearing instructions below.  The rules on driving is that you can not be taking narcotics while you drive, and you must feel in control of the vehicle.    Weight Bearing:  Non weight bearing on right leg  To prevent constipation: you may use a stool softener such as -  Colace (over the counter) 100 mg by mouth twice a day  Drink plenty of fluids (prune juice may be helpful) and high fiber foods Miralax (over the counter) for constipation as needed.    Itching:  If you experience itching with your medications, try taking only a single pain pill, or even half a pain pill at a time.  You may take up to 10 pain pills per day, and you can also use benadryl over the counter for itching or also to help with sleep.   Precautions:  If you experience chest pain or shortness of breath - call 911 immediately for transfer to the hospital emergency department!!  If you develop a fever greater that 101 F, purulent drainage from wound, increased redness or drainage from wound, or calf pain -- Call  the office at [phone]                                                Follow- Up Appointment:  Please call for an appointment to be seen in 2 weeks [place] - [phone]

## 2021-10-30 NOTE — Progress Notes (Addendum)
Physical Therapy Treatment Patient Details Name: Wanda Warner MRN: [id number] DOB: 1980-11-11 Today's Date: 10/30/2021   History of Present Illness Pt is a 41 y/o F presenting to ED on 1/9 after fall while crossing a median, found to have R patellar fx, s/p ORIF on 10/25/2021, NWB;  has a past medical history of Anxiety, Arthritis, Depression, Headache, Loss of smell, Thoracic compression fracture (HCC), and Traumatic brain injury (07/28/2013);  currently living in a hotel with very limited social support, and is on chronic opioids, and was managing very poorly independently    PT Comments    Pt waiting on pain medication upon PT arrival, but agreeable to home education and short-distance gait to prepare for d/c today. Pt had several questions regarding safe exercise, toileting, bathing; PT reviewed gentle HEP with pt, explained washing up without getting RLE wet, and importance of pacing activity to avoid overfatigue and/or pain. Pt tolerated short distance gait to reach restroom today, overall requiring supervision for mobility except for moving in/out of bed which pt states her father can assist with. Pt plans to d/c today, no further questions for PT.     Recommendations for follow up therapy are one component of a multi-disciplinary discharge planning process, led by the attending physician.  Recommendations may be updated based on patient status, additional functional criteria and insurance authorization.  Follow Up Recommendations  Home health PT     Assistance Recommended at Discharge Intermittent Supervision/Assistance  Patient can return home with the following Assist for transportation;A little help with walking and/or transfers;A little help with bathing/dressing/bathroom   Equipment Recommendations  Rolling walker (2 wheels);BSC/3in1    Recommendations for Other Services       Precautions / Restrictions Precautions Precautions: Fall Required Braces or Orthoses: Other  Brace;Splint/Cast Restrictions Weight Bearing Restrictions: Yes RLE Weight Bearing: Non weight bearing     Mobility  Bed Mobility Overal bed mobility: Needs Assistance Bed Mobility: Supine to Sit     Supine to sit: Min assist     General bed mobility comments: light assist for RLE lifting to EOB and lowering over EOB. Pt's father to assist with this at d/c.    Transfers Overall transfer level: Needs assistance Equipment used: Rolling walker (2 wheels) Transfers: Sit to/from Stand Sit to Stand: Supervision           General transfer comment: for safety, cues for safe hand placement when rising/sitting    Ambulation/Gait Ambulation/Gait assistance: Supervision Gait Distance (Feet): 15 Feet Assistive device: Rolling walker (2 wheels) Gait Pattern/deviations: Step-to pattern Gait velocity: decr     General Gait Details: Hop-to gait with good maintenance of NWB RLE   Stairs             Wheelchair Mobility    Modified Rankin (Stroke Patients Only)       Balance Overall balance assessment: Needs assistance Sitting-balance support: Feet supported, Bilateral upper extremity supported Sitting balance-Leahy Scale: Normal     Standing balance support: During functional activity, Reliant on assistive device for balance Standing balance-Leahy Scale: Poor                              Cognition Arousal/Alertness: Awake/alert Behavior During Therapy: WFL for tasks assessed/performed Overall Cognitive Status: Within Functional Limits for tasks assessed  Exercises General Exercises - Lower Extremity Ankle Circles/Pumps: AROM, Both, 5 reps, Supine Quad Sets: AROM, Right, 5 reps, Supine Other Exercises Other Exercises: Pt educated on SLR, hip abd/add, ankle pumps, quad sets, and glute sets for exercise; pt tolerates gentle quad sets, glute sets, and ankle pumps well but cannot perform the  others without assist    General Comments        Pertinent Vitals/Pain Pain Assessment Pain Assessment: Faces Faces Pain Scale: Hurts even more Pain Location: R knee Pain Descriptors / Indicators: Grimacing, Guarding Pain Intervention(s): Limited activity within patient's tolerance, Monitored during session, Repositioned    Home Living                          Prior Function            PT Goals (current goals can now be found in the care plan section) Acute Rehab PT Goals Patient Stated Goal: Pt verbalized teh goal to be able to dc from hospital to new apartment on Friday PT Goal Formulation: With patient Time For Goal Achievement: 11/09/21 Potential to Achieve Goals: Good Progress towards PT goals: Progressing toward goals    Frequency    Min 5X/week      PT Plan Current plan remains appropriate    Co-evaluation              AM-PAC PT "6 Clicks" Mobility   Outcome Measure  Help needed turning from your back to your side while in a flat bed without using bedrails?: None Help needed moving from lying on your back to sitting on the side of a flat bed without using bedrails?: A Little Help needed moving to and from a bed to a chair (including a wheelchair)?: A Little Help needed standing up from a chair using your arms (e.g., wheelchair or bedside chair)?: None Help needed to walk in hospital room?: A Little Help needed climbing 3-5 steps with a railing? : A Little 6 Click Score: 20    End of Session   Activity Tolerance: Patient tolerated treatment well Patient left: with call bell/phone within reach;with family/visitor present;Other (comment) (on toilet, pull cord at pt's side, NT to arrive to room to assist with wash up and changing) Nurse Communication: Mobility status PT Visit Diagnosis: Other abnormalities of gait and mobility (R26.89);Pain Pain - Right/Left: Right Pain - part of body: Leg     Time: 1358-1430 PT Time Calculation (min)  (ACUTE ONLY): 32 min  Charges:  $Gait Training: 8-22 mins $Self Care/Home Management: 8-22                     Marye Round, PT DPT Acute Rehabilitation Services Pager [phone]  Office [phone]    Tyrone Apple E Christain [place] 10/30/2021, 3:41 PM

## 2021-10-30 NOTE — Progress Notes (Signed)
X-rays taken today show ulnar styloid fracture. Can be treated non-operatively. Will get patient a brace to d/c home with.

## 2021-10-30 NOTE — Progress Notes (Addendum)
° ° ° °  Subjective: 5 Days Post-Op s/p Procedure(s): OPEN REDUCTION INTERNAL (ORIF) FIXATION PATELLA   Patient is alert, oriented. Reports she is very happy with her pain regimen overnight, felt like she was finally given a consistent regimen on oxycodone and prn dilaudid. Pain controlled this morning. She is agreeable to working with PT/OT today and to discharge home this afternoon. Denies CP, SOB, nausea, vomiting, belly pain.    Objective:  PE: VITALS:   Vitals:   10/28/21 2108 10/29/21 0819 10/29/21 2147 10/30/21 0734  BP: 111/65 (!) 113/58 100/63 107/60  Pulse: 84 76 86 72  Resp: 17 17 16 18   Temp: 98.6 F (37 C) 97.8 F (36.6 C) (!) 97.2 F (36.2 C) 98.5 F (36.9 C)  TempSrc: Oral Oral Oral Oral  SpO2: 98% 98% 98% 100%  Weight:      Height:       General: laying in bed, in no acute distress Resp:  normal respiratory effort GI: soft, nontender MSK: RLE in long leg splint. Dorsiflexion and plantarflexion intact. Sensation intact to all aspects of foot. 2+ DP Pulse. No significant foot edema.  Noting left wrist pain at ulnar styloid. Full motion. Moderate TTP to ulnar styloid. All fingers flex, extend, and abduct. Distal sensation intact. Hand warm and well perfused.   LABS  No results found for this or any previous visit (from the past 24 hour(s)).  No results found.  Assessment/Plan: 41 yo patient with displaced right patella fracture with history of TBI and opioid dependency     5 Days Post-Op s/p Procedure(s): OPEN REDUCTION INTERNAL (ORIF) FIXATION PATELLA  Left wrist pain: - x-ray ordered, will order wrist brace prior to discharge this afternoon  Weightbearing: NWB RLE Insicional and dressing care: Dressings left intact until follow-up Orthopedic device(s): Long leg splint VTE prophylaxis: Lovenox 40 mg q 24 hours Pain control:  Continue current regiment of q 4 hours oxycodone scheduled with prn 2 mg po dilaudid q 4 hours. Dispo plan: Plan to discharge  home today with HHPT. Will speak with TOC team regarding ability to set this up given patient will be getting new apartment within the next week. She has safe discharge plan home to discharge with father.   Follow - up plan: 1 weeks with Dr. 41 information:   Erskine Emery 8-5 [id number], PA-C [phone] A fter hours and holidays please check [email or web address] for group call information for [facility]  [phone] 10/30/2021, 8:02 AM

## 2021-10-30 NOTE — Plan of Care (Signed)
  Problem: Activity: Goal: Risk for activity intolerance will decrease Outcome: Progressing   Problem: Nutrition: Goal: Adequate nutrition will be maintained Outcome: Progressing   Problem: Coping: Goal: Level of anxiety will decrease Outcome: Progressing   Problem: Elimination: Goal: Will not experience complications related to bowel motility Outcome: Progressing   

## 2021-11-02 ENCOUNTER — Other Ambulatory Visit (HOSPITAL_COMMUNITY): Payer: Self-pay

## 2021-11-24 ENCOUNTER — Telehealth: Payer: Self-pay

## 2021-11-24 NOTE — Telephone Encounter (Signed)
Clair Gulling, PT from [place] called to see if Dr. Naaman Plummer is the patient's PCP. Left detailed voicemail stating Dr. Naaman Plummer is not her PCP and she has not been seen since 01/2021. Informed him that Dr. Patrica Duel could be her PCP

## 2021-12-03 IMAGING — MR MR LUMBAR SPINE W/O CM
4 of 5 series · 27 of 48 positions shown · non-contrast
Comparison: Radiography 06/20/2015

CLINICAL DATA: Low back pain with stabbing sensation. Symptoms
worse on the right the left.

EXAM:
MRI LUMBAR SPINE WITHOUT CONTRAST
TECHNIQUE: Multiplanar, multisequence MR imaging of the lumbar spine was
performed. No intravenous contrast was administered.

[Series 3: T2 · sagittal · 4.0mm · 1.09mm/px · 6 of 17 slices shown (1 of 2)]
[im 1/17]
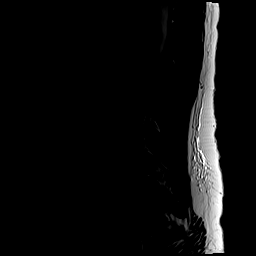
[im 4/17]
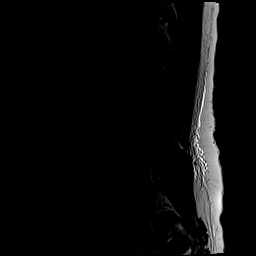
[im 7/17]
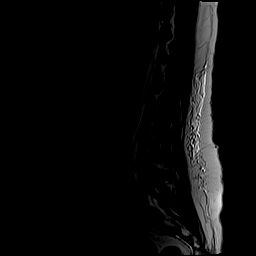
[im 10/17]
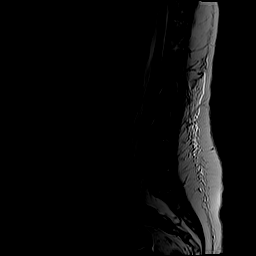
[im 13/17]
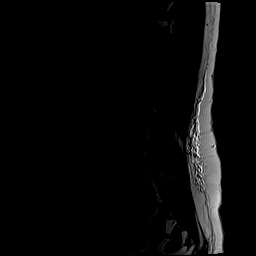
[im 17/17]
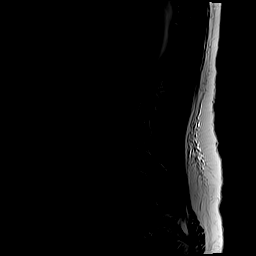

[Series 5: T1 · sagittal · 4.0mm · 1.09mm/px · 6 of 17 slices shown (1 of 2)]
[im 1/17]
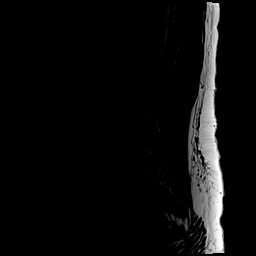
[im 4/17]
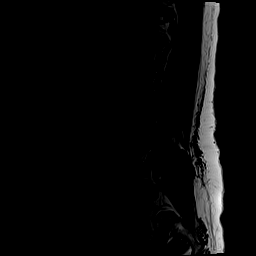
[im 7/17]
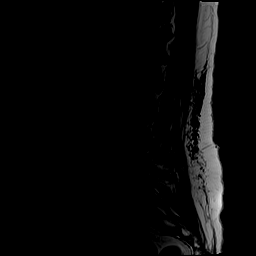
[im 10/17]
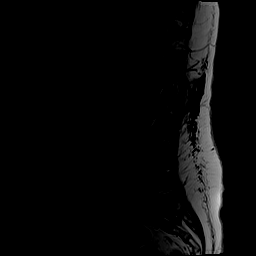
[im 13/17]
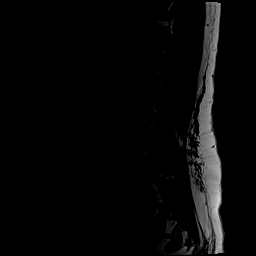
[im 17/17]
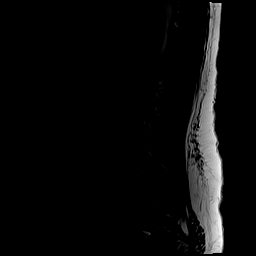

[Series 6: T2 · axial · 4.0mm · 0.39mm/px · z∈[-46,+159]mm · 9 of 41 slices shown (2 of 2)]
[im 1/41]
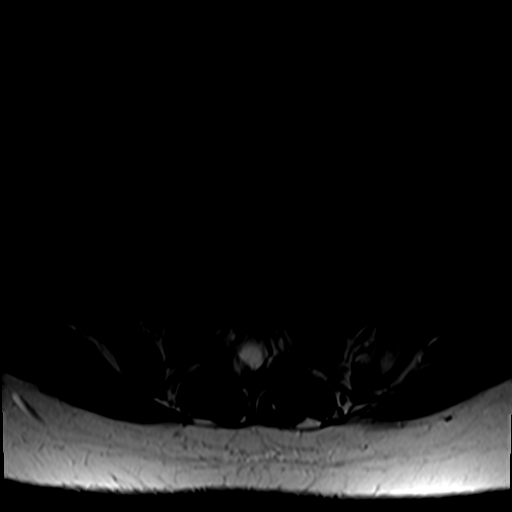
[im 6/41]
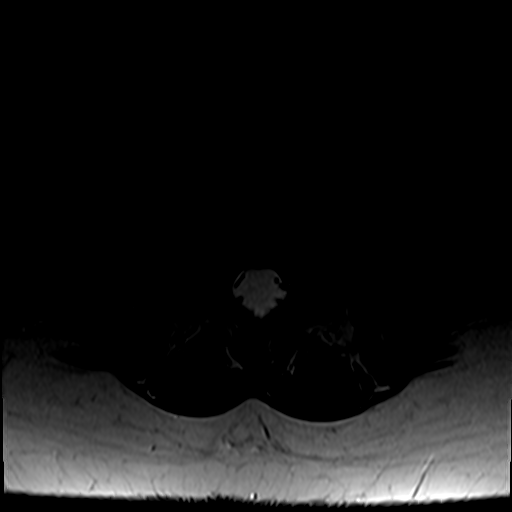
[im 12/41]
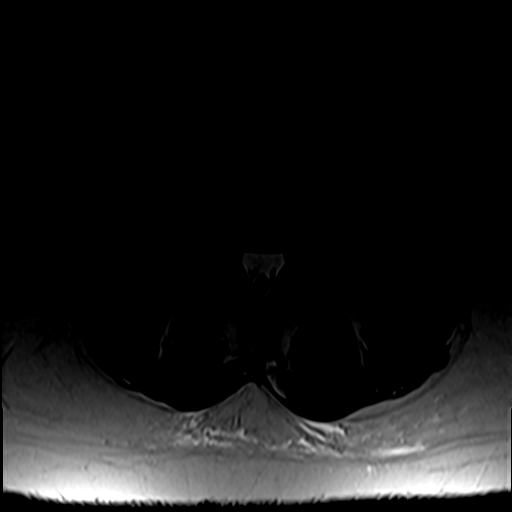
[im 18/41]
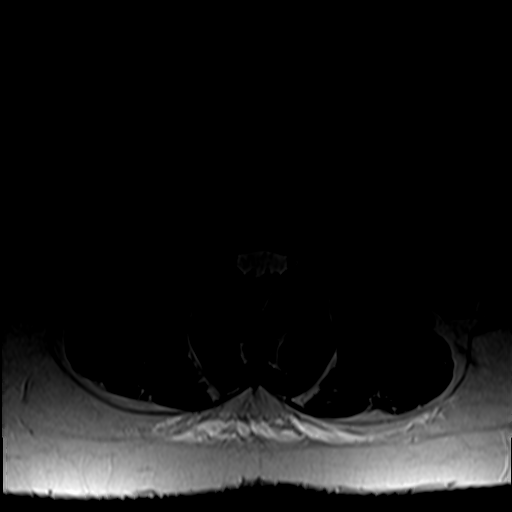
[im 21/41]
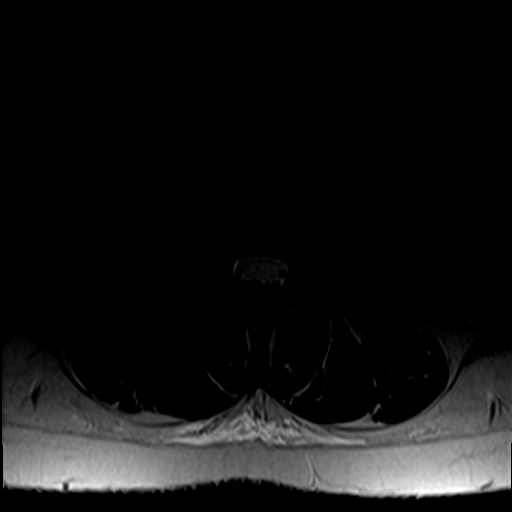
[im 23/41]
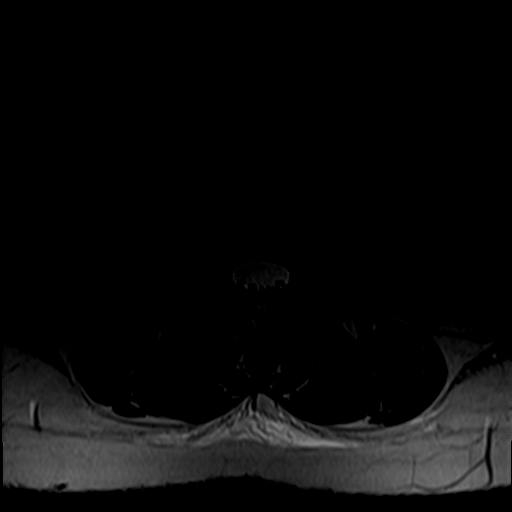
[im 29/41]
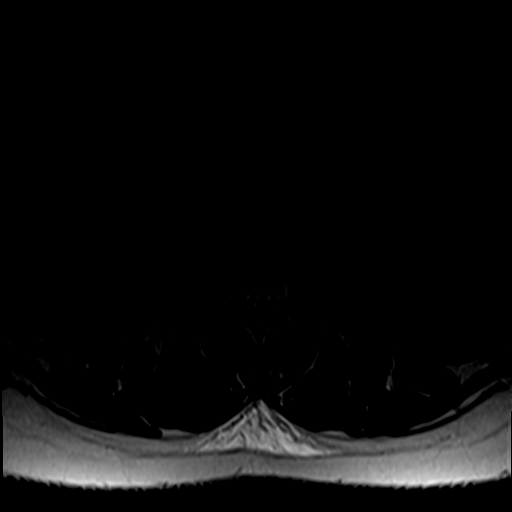
[im 35/41]
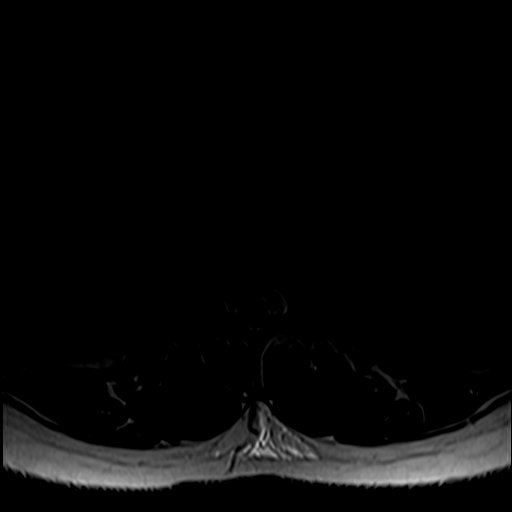
[im 41/41]
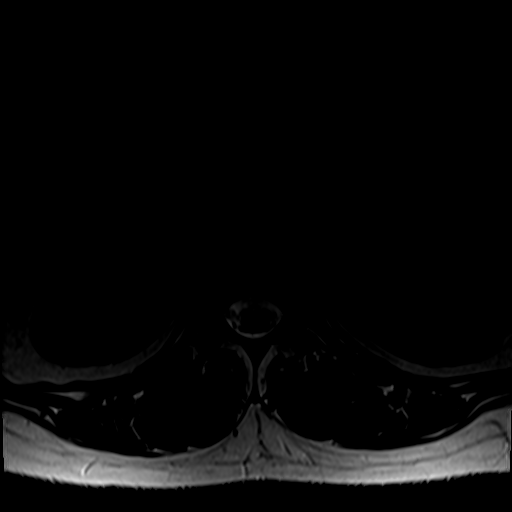

[Series 7: T1 · axial · 4.0mm · 0.39mm/px · z∈[-46,+131]mm · 6 of 41 slices shown (2 of 2)]
[im 1/41]
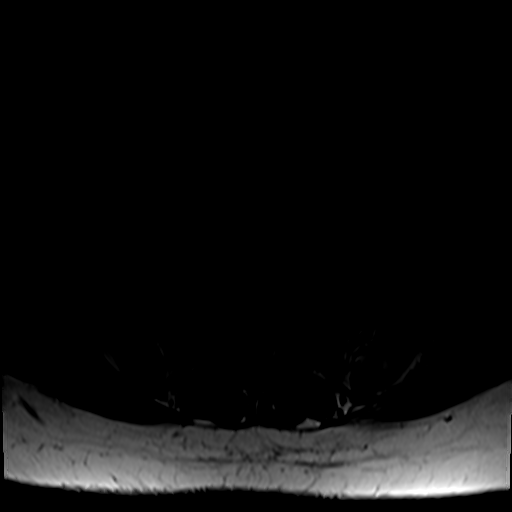
[im 6/41]
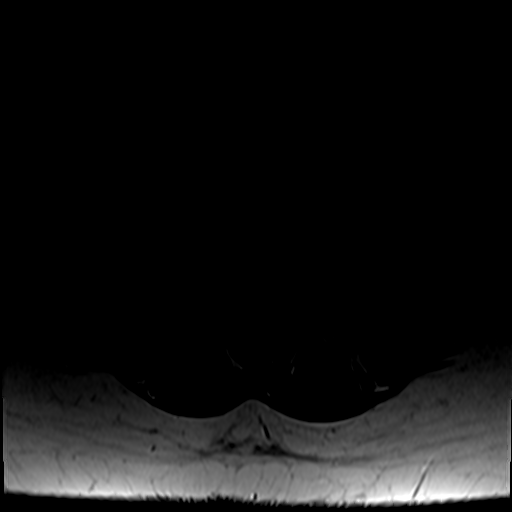
[im 12/41]
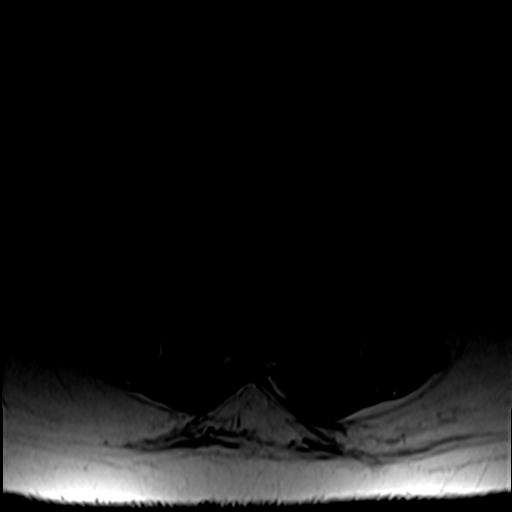
[im 18/41]
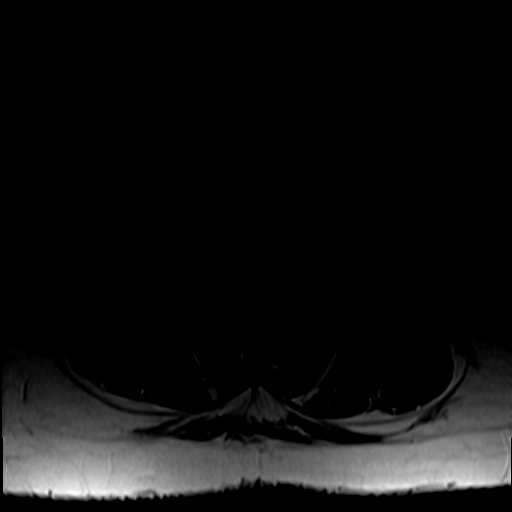
[im 21/41]
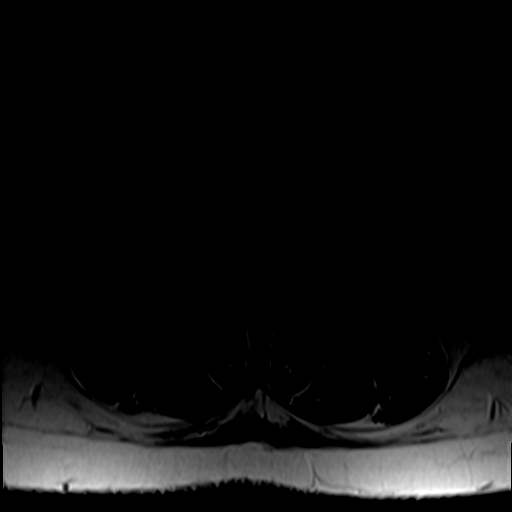
[im 35/41]
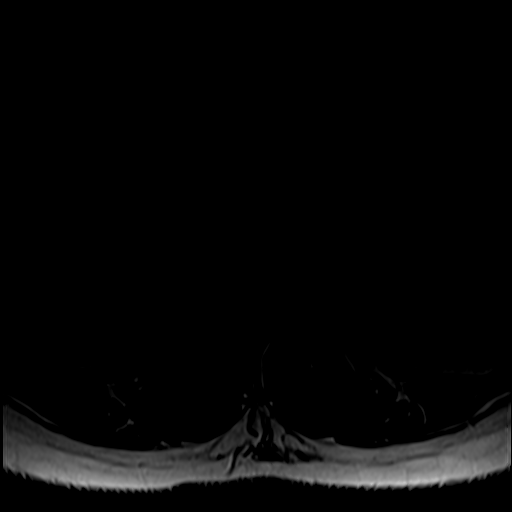

[27 of 48 positions shown; findings below may reference images not displayed]

FINDINGS: Segmentation:  5 lumbar type vertebral bodies.

Alignment:  Normal

Vertebrae: No significant bone finding. Few scattered areas of fat
and or small hemangiomas. Minimal discogenic endplate edema on the
right at L5-S1 could possibly relate to low back pain.

Conus medullaris and cauda equina: Conus extends to the L1-2 level.
Conus and cauda equina appear normal.

Paraspinal and other soft tissues: Negative

Disc levels:

No abnormality at L3-4 or above.

L4-5: Mild desiccation and bulging of the disc. No canal or
foraminal stenosis.

L5-S1: Mild desiccation of the disc with minimal bulging. No canal
or foraminal stenosis. As noted above, there is minimal endplate
edema which could be associated with low back pain. Mild facet
osteoarthritis also present this level.
IMPRESSION: No advanced finding. Mild desiccation and bulging of the discs at
L4-5 and L5-S1. No disc herniation. No canal or foraminal stenosis.
Minimal discogenic endplate edema at L5-S1 on the right could be
associated with low back pain. Mild facet osteoarthritis at L5-S1.
